# Patient Record
Sex: Male | Born: 1955 | Race: White | Hispanic: No | Marital: Married | Smoking: Former smoker
Health system: Southern US, Community
[De-identification: ages and names within clinical notes are randomized; demographics above are authoritative.]

## PROBLEM LIST (undated history)

## (undated) DIAGNOSIS — C801 Malignant (primary) neoplasm, unspecified: Secondary | ICD-10-CM

## (undated) DIAGNOSIS — J189 Pneumonia, unspecified organism: Secondary | ICD-10-CM

## (undated) DIAGNOSIS — M810 Age-related osteoporosis without current pathological fracture: Secondary | ICD-10-CM

## (undated) DIAGNOSIS — E119 Type 2 diabetes mellitus without complications: Secondary | ICD-10-CM

## (undated) DIAGNOSIS — J42 Unspecified chronic bronchitis: Secondary | ICD-10-CM

## (undated) DIAGNOSIS — J849 Interstitial pulmonary disease, unspecified: Secondary | ICD-10-CM

## (undated) DIAGNOSIS — R570 Cardiogenic shock: Secondary | ICD-10-CM

## (undated) DIAGNOSIS — Z79899 Other long term (current) drug therapy: Secondary | ICD-10-CM

## (undated) DIAGNOSIS — I2510 Atherosclerotic heart disease of native coronary artery without angina pectoris: Secondary | ICD-10-CM

## (undated) DIAGNOSIS — I779 Disorder of arteries and arterioles, unspecified: Secondary | ICD-10-CM

## (undated) DIAGNOSIS — I10 Essential (primary) hypertension: Secondary | ICD-10-CM

## (undated) DIAGNOSIS — C4401 Basal cell carcinoma of skin of lip: Secondary | ICD-10-CM

## (undated) DIAGNOSIS — A419 Sepsis, unspecified organism: Secondary | ICD-10-CM

## (undated) DIAGNOSIS — R918 Other nonspecific abnormal finding of lung field: Secondary | ICD-10-CM

## (undated) DIAGNOSIS — K219 Gastro-esophageal reflux disease without esophagitis: Secondary | ICD-10-CM

## (undated) DIAGNOSIS — Z7901 Long term (current) use of anticoagulants: Secondary | ICD-10-CM

## (undated) DIAGNOSIS — M069 Rheumatoid arthritis, unspecified: Secondary | ICD-10-CM

## (undated) DIAGNOSIS — J449 Chronic obstructive pulmonary disease, unspecified: Secondary | ICD-10-CM

## (undated) DIAGNOSIS — I480 Paroxysmal atrial fibrillation: Secondary | ICD-10-CM

## (undated) DIAGNOSIS — K760 Fatty (change of) liver, not elsewhere classified: Secondary | ICD-10-CM

## (undated) DIAGNOSIS — M0510 Rheumatoid lung disease with rheumatoid arthritis of unspecified site: Secondary | ICD-10-CM

## (undated) DIAGNOSIS — Z8719 Personal history of other diseases of the digestive system: Secondary | ICD-10-CM

## (undated) DIAGNOSIS — H9190 Unspecified hearing loss, unspecified ear: Secondary | ICD-10-CM

## (undated) DIAGNOSIS — I442 Atrioventricular block, complete: Secondary | ICD-10-CM

## (undated) DIAGNOSIS — L719 Rosacea, unspecified: Secondary | ICD-10-CM

## (undated) DIAGNOSIS — E1151 Type 2 diabetes mellitus with diabetic peripheral angiopathy without gangrene: Secondary | ICD-10-CM

## (undated) DIAGNOSIS — E785 Hyperlipidemia, unspecified: Secondary | ICD-10-CM

## (undated) DIAGNOSIS — I700 Atherosclerosis of aorta: Secondary | ICD-10-CM

## (undated) DIAGNOSIS — I6523 Occlusion and stenosis of bilateral carotid arteries: Secondary | ICD-10-CM

## (undated) DIAGNOSIS — I4891 Unspecified atrial fibrillation: Secondary | ICD-10-CM

## (undated) DIAGNOSIS — R748 Abnormal levels of other serum enzymes: Secondary | ICD-10-CM

## (undated) DIAGNOSIS — G4730 Sleep apnea, unspecified: Secondary | ICD-10-CM

## (undated) DIAGNOSIS — R0602 Shortness of breath: Secondary | ICD-10-CM

## (undated) DIAGNOSIS — Z7960 Long term (current) use of unspecified immunomodulators and immunosuppressants: Secondary | ICD-10-CM

## (undated) DIAGNOSIS — C44311 Basal cell carcinoma of skin of nose: Secondary | ICD-10-CM

## (undated) DIAGNOSIS — M1990 Unspecified osteoarthritis, unspecified site: Secondary | ICD-10-CM

## (undated) DIAGNOSIS — H409 Unspecified glaucoma: Secondary | ICD-10-CM

## (undated) HISTORY — PX: ESOPHAGOGASTRODUODENOSCOPY: SHX1529

## (undated) HISTORY — PX: OTHER SURGICAL HISTORY: SHX169

## (undated) HISTORY — PX: ABDOMINAL EXPLORATION SURGERY: SHX538

## (undated) HISTORY — PX: KNEE SURGERY: SHX244

## (undated) HISTORY — PX: LUNG SURGERY: SHX703

## (undated) HISTORY — DX: Other nonspecific abnormal finding of lung field: R91.8

## (undated) HISTORY — PX: TONSILLECTOMY: SUR1361

## (undated) HISTORY — PX: COLONOSCOPY: SHX174

## (undated) HISTORY — PX: HERNIA REPAIR: SHX51

## (undated) HISTORY — DX: Interstitial pulmonary disease, unspecified: J84.9

## (undated) HISTORY — DX: Unspecified atrial fibrillation: I48.91

---
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
## 1993-03-12 HISTORY — PX: KNEE SURGERY: SHX244

## 1998-03-12 HISTORY — PX: HIATAL HERNIA REPAIR: SHX195

## 2006-10-08 ENCOUNTER — Emergency Department: Payer: Self-pay | Admitting: Emergency Medicine

## 2006-10-08 ENCOUNTER — Other Ambulatory Visit: Payer: Self-pay

## 2012-03-12 ENCOUNTER — Emergency Department (HOSPITAL_COMMUNITY): Payer: Medicare Other

## 2012-03-12 ENCOUNTER — Encounter (HOSPITAL_COMMUNITY): Payer: Self-pay | Admitting: *Deleted

## 2012-03-12 ENCOUNTER — Emergency Department (HOSPITAL_COMMUNITY)
Admission: EM | Admit: 2012-03-12 | Discharge: 2012-03-13 | Disposition: A | Discharge status: Home / Self Care | Payer: Medicare Other | Attending: Emergency Medicine | Admitting: Emergency Medicine

## 2012-03-12 DIAGNOSIS — E119 Type 2 diabetes mellitus without complications: Secondary | ICD-10-CM | POA: Insufficient documentation

## 2012-03-12 DIAGNOSIS — R109 Unspecified abdominal pain: Secondary | ICD-10-CM

## 2012-03-12 DIAGNOSIS — IMO0002 Reserved for concepts with insufficient information to code with codable children: Secondary | ICD-10-CM | POA: Insufficient documentation

## 2012-03-12 DIAGNOSIS — Z8739 Personal history of other diseases of the musculoskeletal system and connective tissue: Secondary | ICD-10-CM | POA: Insufficient documentation

## 2012-03-12 DIAGNOSIS — Z9889 Other specified postprocedural states: Secondary | ICD-10-CM | POA: Insufficient documentation

## 2012-03-12 DIAGNOSIS — Z79899 Other long term (current) drug therapy: Secondary | ICD-10-CM | POA: Insufficient documentation

## 2012-03-12 HISTORY — DX: Unspecified osteoarthritis, unspecified site: M19.90

## 2012-03-12 HISTORY — DX: Type 2 diabetes mellitus without complications: E11.9

## 2012-03-12 LAB — CBC WITH DIFFERENTIAL/PLATELET
Eosinophils Absolute: 0.2 10*3/uL (ref 0.0–0.7)
Hemoglobin: 15.5 g/dL (ref 13.0–17.0)
Lymphs Abs: 2.8 10*3/uL (ref 0.7–4.0)
MCH: 29.8 pg (ref 26.0–34.0)
MCV: 87.3 fL (ref 78.0–100.0)
Monocytes Relative: 12 % (ref 3–12)
Neutrophils Relative %: 49 % (ref 43–77)
RBC: 5.21 MIL/uL (ref 4.22–5.81)

## 2012-03-12 LAB — COMPREHENSIVE METABOLIC PANEL
Alkaline Phosphatase: 73 U/L (ref 39–117)
BUN: 19 mg/dL (ref 6–23)
CO2: 27 mEq/L (ref 19–32)
GFR calc Af Amer: >90 mL/min (ref >90)
GFR calc non Af Amer: >90 mL/min (ref >90)
Glucose, Bld: 404 mg/dL — ABNORMAL HIGH (ref 70–99)
Potassium: 4 mEq/L (ref 3.5–5.1)
Total Bilirubin: 0.3 mg/dL (ref 0.3–1.2)
Total Protein: 7 g/dL (ref 6.0–8.3)

## 2012-03-12 LAB — URINALYSIS, ROUTINE W REFLEX MICROSCOPIC
Bilirubin Urine: NEGATIVE
Hgb urine dipstick: NEGATIVE
Nitrite: NEGATIVE
Specific Gravity, Urine: 1.031 — ABNORMAL HIGH (ref 1.005–1.030)
pH: 5 (ref 5.0–8.0)

## 2012-03-12 LAB — URINE MICROSCOPIC-ADD ON

## 2012-03-12 MED ORDER — ONDANSETRON HCL 4 MG/2ML IJ SOLN
4.0000 mg | Freq: Once | INTRAMUSCULAR | Status: AC
  Administered 2012-03-12: 4 mg via INTRAVENOUS
  Filled 2012-03-12: qty 2

## 2012-03-12 MED ORDER — HYDROMORPHONE HCL PF 1 MG/ML IJ SOLN
1.0000 mg | Freq: Once | INTRAMUSCULAR | Status: AC
  Administered 2012-03-12: 1 mg via INTRAVENOUS
  Filled 2012-03-12: qty 1

## 2012-03-12 NOTE — ED Notes (Signed)
Pt states right flank pain since 3-4 days ago, intermittant. Pt states movement sometimes stops the pain. Pain radiates from his back to his right side of abdomen. Pt states that he fell on his barn door on his right side 2-3 weeks ago and is unsure if that is the reason for the pain as well.

## 2012-03-13 ENCOUNTER — Emergency Department (HOSPITAL_COMMUNITY): Payer: Medicare Other

## 2012-03-13 MED ORDER — HYDROCODONE-ACETAMINOPHEN 5-325 MG PO TABS: 2.0000 | ORAL_TABLET | Freq: Four times a day (QID) | ORAL | Status: DC | PRN

## 2012-03-13 MED ORDER — SODIUM CHLORIDE 0.9 % IV BOLUS (SEPSIS)
1000.0000 mL | Freq: Once | INTRAVENOUS | Status: AC
  Administered 2012-03-13: 1000 mL via INTRAVENOUS

## 2012-03-13 NOTE — ED Provider Notes (Signed)
History     CSN: [id number]  Arrival date & time 03/12/12  2146   First MD Initiated Contact with Patient 03/12/12 2252      Chief Complaint  Patient presents with  . Flank Pain    right    (Consider location/radiation/quality/duration/timing/severity/associated sxs/prior treatment) HPI Hx per PT. R sided ABD pain for the last few weeks worse tonight, hurts to move, did fall over a montha go but was not hurting after that, has known elevated LFTs " all of the time" followed by PCP at [facility].  Pain not related. Pain is squeezing in quality and radiates from R thoracic spine to R flank and RUQ.yesterday felt numb. No weakness. No extremity numbness. No rash, no h/osame. Mod in severity Past Medical History  Diagnosis Date  . Diabetes mellitus without complication   . Arthritis     Past Surgical History  Procedure Date  . Knee surgery   . Hernia repair     umbilical    History reviewed. No pertinent family history.  History  Substance Use Topics  . Smoking status: Never Smoker   . Smokeless tobacco: Not on file  . Alcohol Use: No      Review of Systems  Constitutional: Negative for fever and chills.  HENT: Negative for neck pain and neck stiffness.   Eyes: Negative for pain.  Respiratory: Negative for shortness of breath.   Cardiovascular: Negative for chest pain.  Gastrointestinal: Negative for nausea, vomiting, diarrhea, constipation, blood in stool and abdominal distention.  Genitourinary: Negative for dysuria, urgency and frequency.  Musculoskeletal: Negative for back pain.  Skin: Negative for rash.  Neurological: Negative for headaches.  All other systems reviewed and are negative.    Allergies  Methotrexate derivatives and Aspirin  Home Medications   Current Outpatient Rx  Name  Route  Sig  Dispense  Refill  . ADALIMUMAB 40 MG/0.8ML Victoria KIT   Subcutaneous   Inject 40 mg into the skin every 14 (fourteen) days.         Marland Kitchen VITAMIN D 2000 UNITS PO  CAPS   Oral   Take 2,000 Units by mouth daily.         Marland Kitchen GLIMEPIRIDE 4 MG PO TABS   Oral   Take 4 mg by mouth daily before breakfast.         . IPRATROPIUM-ALBUTEROL 20-100 MCG/ACT IN AERS   Inhalation   Inhale 1 puff into the lungs 4 (four) times daily as needed. For copd         . LEFLUNOMIDE 20 MG PO TABS   Oral   Take 20 mg by mouth daily.         . ADULT MULTIVITAMIN W/MINERALS CH   Oral   Take 1 tablet by mouth daily.         Marland Kitchen PREDNISONE 10 MG PO TABS   Oral   Take 10 mg by mouth daily.         Marland Kitchen ROSUVASTATIN CALCIUM 20 MG PO TABS   Oral   Take 20 mg by mouth daily.         Marland Kitchen VITAMIN E 400 UNITS PO CAPS   Oral   Take 400 Units by mouth daily.           BP 147/80  Pulse 84  Temp 97.4 F (36.3 C) (Oral)  Resp 16  SpO2 96%  Physical Exam  Constitutional: He is oriented to person, place, and time. He appears well-developed and  well-nourished.  HENT:  Head: Normocephalic and atraumatic.  Eyes: Conjunctivae normal and EOM are normal. Pupils are equal, round, and reactive to light. No scleral icterus.  Neck: Trachea normal. Neck supple. No thyromegaly present.  Cardiovascular: Normal rate, regular rhythm, S1 normal, S2 normal and normal pulses.     No systolic murmur is present   No diastolic murmur is present  Pulses:      Radial pulses are 2+ on the right side, and 2+ on the left side.  Pulmonary/Chest: Effort normal and breath sounds normal. He has no wheezes. He has no rhonchi. He has no rales. He exhibits no tenderness.  Abdominal: Soft. Normal appearance and bowel sounds are normal. There is no tenderness. There is no CVA tenderness and negative Murphy's sign.       Localizes discomfort R flank without ecchymosis or rash and no reproducible tenderness, neg Murphys sign  Musculoskeletal:       BLE:s Calves nontender, no cords or erythema, negative Homans sign  Neurological: He is alert and oriented to person, place, and time. He has  normal strength. No cranial nerve deficit or sensory deficit. GCS eye subscore is 4. GCS verbal subscore is 5. GCS motor subscore is 6.  Skin: Skin is warm and dry. No rash noted. He is not diaphoretic.  Psychiatric: His speech is normal.       Cooperative and appropriate    ED Course  Procedures (including critical care time)  Results for orders placed during the hospital encounter of 03/12/12  URINALYSIS, ROUTINE W REFLEX MICROSCOPIC      Component Value Range   Color, Urine YELLOW  YELLOW   APPearance CLEAR  CLEAR   Specific Gravity, Urine 1.031 (*) 1.005 - 1.030   pH 5.0  5.0 - 8.0   Glucose, UA >1000 (*) NEGATIVE mg/dL   Hgb urine dipstick NEGATIVE  NEGATIVE   Bilirubin Urine NEGATIVE  NEGATIVE   Ketones, ur NEGATIVE  NEGATIVE mg/dL   Protein, ur NEGATIVE  NEGATIVE mg/dL   Urobilinogen, UA 0.2  0.0 - 1.0 mg/dL   Nitrite NEGATIVE  NEGATIVE   Leukocytes, UA NEGATIVE  NEGATIVE  CBC WITH DIFFERENTIAL      Component Value Range   WBC 7.9  4.0 - 10.5 K/uL   RBC 5.21  4.22 - 5.81 MIL/uL   Hemoglobin 15.5  13.0 - 17.0 g/dL   HCT 40.9  81.1 - 91.4 %   MCV 87.3  78.0 - 100.0 fL   MCH 29.8  26.0 - 34.0 pg   MCHC 34.1  30.0 - 36.0 g/dL   RDW 78.2  95.6 - 21.3 %   Platelets 187  150 - 400 K/uL   Neutrophils Relative 49  43 - 77 %   Neutro Abs 3.9  1.7 - 7.7 K/uL   Lymphocytes Relative 36  12 - 46 %   Lymphs Abs 2.8  0.7 - 4.0 K/uL   Monocytes Relative 12  3 - 12 %   Monocytes Absolute 0.9  0.1 - 1.0 K/uL   Eosinophils Relative 3  0 - 5 %   Eosinophils Absolute 0.2  0.0 - 0.7 K/uL   Basophils Relative 0  0 - 1 %   Basophils Absolute 0.0  0.0 - 0.1 K/uL  COMPREHENSIVE METABOLIC PANEL      Component Value Range   Sodium 133 (*) 135 - 145 mEq/L   Potassium 4.0  3.5 - 5.1 mEq/L   Chloride 96  96 - 112  mEq/L   CO2 27  19 - 32 mEq/L   Glucose, Bld 404 (*) 70 - 99 mg/dL   BUN 19  6 - 23 mg/dL   Creatinine, Ser 6.96  0.50 - 1.35 mg/dL   Calcium 9.1  8.4 - 29.5 mg/dL   Total  Protein 7.0  6.0 - 8.3 g/dL   Albumin 3.5  3.5 - 5.2 g/dL   AST 42 (*) 0 - 37 U/L   ALT 75 (*) 0 - 53 U/L   Alkaline Phosphatase 73  39 - 117 U/L   Total Bilirubin 0.3  0.3 - 1.2 mg/dL   GFR calc non Af Amer >90  >90 mL/min   GFR calc Af Amer >90  >90 mL/min  URINE MICROSCOPIC-ADD ON      Component Value Range   WBC, UA 0-2  <3 WBC/hpf   RBC / HPF 0-2  <3 RBC/hpf   Ct Abdomen Pelvis Wo Contrast  03/13/2012  *RADIOLOGY REPORT*  Clinical Data: Right flank and right lower quadrant pain.  CT ABDOMEN AND PELVIS WITHOUT CONTRAST  Technique:  Multidetector CT imaging of the abdomen and pelvis was performed following the standard protocol without intravenous contrast.  Comparison: None.  Findings: The visualized lung bases are clear.  The liver and spleen are unremarkable in appearance.  The gallbladder is within normal limits.  The pancreas and adrenal glands are unremarkable.  Mild nonspecific perinephric stranding is noted bilaterally.  The kidneys are otherwise unremarkable in appearance.  There is no evidence of hydronephrosis.  No renal or ureteral stones are seen.  No free fluid is identified.  The small bowel is unremarkable in appearance.  The stomach is within normal limits.  No acute vascular abnormalities are seen.  Mild scattered calcification is noted along the distal abdominal aorta and its branches. Incidental note is made of a circumaortic left renal vein.  The appendix is normal in caliber and contains air, without evidence for appendicitis.  The colon is unremarkable in appearance.  The bladder is largely decompressed and grossly unremarkable; a small urachal remnant is incidentally noted.  The prostate remains normal in size.  No inguinal lymphadenopathy is seen.  No acute osseous abnormalities are identified.  IMPRESSION:  1.  No acute abnormality seen within the abdomen or pelvis. 2.  Mild scattered calcification along the distal abdominal aorta and its branches. 3.  Incidental note of a  circumaortic left renal vein.   Original Report Authenticated By: Tonia Ghent, M.D.    US Abdomen Complete  03/13/2012  *RADIOLOGY REPORT*  Clinical Data:  Right upper quadrant and right flank pain.  ABDOMINAL ULTRASOUND COMPLETE  Comparison:  CT of the abdomen and pelvis performed 03/12/2012  Findings:  Gallbladder:  The gallbladder is normal in appearance, without evidence for gallstones, gallbladder wall thickening or pericholecystic fluid.  No ultrasonographic Murphy's sign is elicited.  Common Bile Duct:  0.3 cm in diameter; within normal limits in caliber.  Liver:  Areas of mildly increased parenchymal echogenicity within the liver may reflect mild fatty infiltration; no focal lesions identified.  Limited Doppler evaluation demonstrates normal blood flow within the liver.  IVC:  Unremarkable in appearance.  Pancreas:  Although the pancreas is difficult to visualize in its entirety due to overlying bowel gas, no focal pancreatic abnormality is identified.  Spleen:  10.0 cm in length; within normal limits in size and echotexture.  Right kidney:  11.1 cm in length; normal in size, configuration and parenchymal echogenicity.  No evidence  of hydronephrosis.  A 1.9 x 1.3 x 1.4 cm cyst is noted at the lower pole of the right kidney.  Left kidney:  11.6 cm in length; normal in size, configuration and parenchymal echogenicity.  No evidence of mass or hydronephrosis.  Abdominal Aorta:  Normal in caliber; no aneurysm identified.  IMPRESSION:  1.  No acute abnormality seen within abdomen. 2.  Question of mild fatty infiltration within the liver. 3.  Small right renal cyst seen.  On further evaluation of the recent prior CT, there appears to be minimal soft tissue inflammation involving the central small bowel mesentery; this may reflect a mild infectious or inflammatory process.  No additional evidence for acute abnormality within the abdomen or pelvis.   Original Report Authenticated By: Tonia Ghent, M.D.     IVFs. IV Dilaudid  12:42 AM recheck pain much improved.   Plan Rx and f/u PCP. No CP/ SOB to suggest PE and no deficits to suggest need for emergent MRI.   PT and his wife are comfortable with plan and do have scheduled PCP follow up 2 days MDM   Right back and flank pain radiates right upper quadrant abdomen. Evaluated with labs and imaging as above. Treated with IV fluids and IV narcotics. 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 Condition improved. Vital signs and nursing notes reviewed and considered.        Sunnie Nielsen, MD 03/13/12 [phone]

## 2012-03-13 NOTE — ED Notes (Signed)
Pt Returned from Ultrasound 

## 2012-03-13 NOTE — ED Notes (Signed)
Dr. Opitz at bedside. 

## 2012-08-14 ENCOUNTER — Other Ambulatory Visit: Payer: Self-pay | Admitting: Gastroenterology

## 2012-11-04 ENCOUNTER — Encounter (HOSPITAL_COMMUNITY): Admission: RE | Payer: Self-pay | Source: Ambulatory Visit

## 2012-11-04 ENCOUNTER — Ambulatory Visit (HOSPITAL_COMMUNITY)
Admission: RE | Admit: 2012-11-04 | Payer: PRIVATE HEALTH INSURANCE | Source: Ambulatory Visit | Admitting: Gastroenterology

## 2012-11-04 SURGERY — COLONOSCOPY WITH PROPOFOL
Anesthesia: Monitor Anesthesia Care

## 2012-11-17 ENCOUNTER — Other Ambulatory Visit: Payer: Self-pay | Admitting: Otolaryngology

## 2012-11-17 DIAGNOSIS — R220 Localized swelling, mass and lump, head: Secondary | ICD-10-CM

## 2012-11-20 ENCOUNTER — Other Ambulatory Visit: Payer: PRIVATE HEALTH INSURANCE

## 2012-11-20 ENCOUNTER — Ambulatory Visit
Admission: RE | Admit: 2012-11-20 | Discharge: 2012-11-20 | Disposition: A | Discharge status: Home / Self Care | Payer: Medicare Other | Source: Ambulatory Visit | Attending: Otolaryngology | Admitting: Otolaryngology

## 2012-11-20 DIAGNOSIS — R220 Localized swelling, mass and lump, head: Secondary | ICD-10-CM

## 2012-11-20 MED ORDER — IOHEXOL 300 MG/ML  SOLN
76.0000 mL | Freq: Once | INTRAMUSCULAR | Status: AC | PRN
  Administered 2012-11-20: 76 mL via INTRAVENOUS

## 2012-11-26 ENCOUNTER — Other Ambulatory Visit: Payer: Self-pay | Admitting: Otolaryngology

## 2012-11-26 DIAGNOSIS — R221 Localized swelling, mass and lump, neck: Secondary | ICD-10-CM

## 2012-11-30 ENCOUNTER — Ambulatory Visit
Admission: RE | Admit: 2012-11-30 | Discharge: 2012-11-30 | Disposition: A | Discharge status: Home / Self Care | Payer: Medicare Other | Source: Ambulatory Visit | Attending: Otolaryngology | Admitting: Otolaryngology

## 2012-11-30 DIAGNOSIS — R221 Localized swelling, mass and lump, neck: Secondary | ICD-10-CM

## 2012-11-30 MED ORDER — GADOBENATE DIMEGLUMINE 529 MG/ML IV SOLN
17.0000 mL | Freq: Once | INTRAVENOUS | Status: AC | PRN
  Administered 2012-11-30: 17 mL via INTRAVENOUS

## 2012-12-01 ENCOUNTER — Emergency Department: Payer: Self-pay | Admitting: Emergency Medicine

## 2012-12-24 ENCOUNTER — Other Ambulatory Visit: Payer: Self-pay | Admitting: Otolaryngology

## 2012-12-24 DIAGNOSIS — R1310 Dysphagia, unspecified: Secondary | ICD-10-CM

## 2012-12-29 ENCOUNTER — Ambulatory Visit
Admission: RE | Admit: 2012-12-29 | Discharge: 2012-12-29 | Disposition: A | Discharge status: Home / Self Care | Payer: Medicare Other | Source: Ambulatory Visit | Attending: Otolaryngology | Admitting: Otolaryngology

## 2012-12-29 DIAGNOSIS — R1310 Dysphagia, unspecified: Secondary | ICD-10-CM

## 2013-01-05 ENCOUNTER — Encounter (HOSPITAL_COMMUNITY): Payer: Self-pay | Admitting: Pharmacy Technician

## 2013-01-05 NOTE — H&P (Signed)
Assessment  Former cigarette smoker of unknown amount (Z61.09) (U04.540). Neck mass (784.2) (R22.1). Orders  CT Neck with contrast; Requested for: 14 Nov 2012. Discussed  Right submandibular mass, or possibly level 2 nodes. It's a little difficult to distinguish based on examination. Recommend CT evaluation. No other pathology identified. Amended : Serena Colonel  M.D.; 11/21/2012 12:41 PM EST. Reason For Visit  Darryl Jones is here today at the kind request of [place], Hal for consultation and opinion for lump under chain. HPI  One month history of intermittent swelling of the right upper neck area. He is currently on antibiotics. He has associated difficulty swallowing when this swells up. He quit smoking a couple years ago as well as drinking. He is on Humira for rheumatoid arthritis. Allergies  Aspirin TABS; Rash Methotrexate Derivatives. Current Meds  Centrum Oral Tablet;TAKE 1 TABLET DAILY.; RPT Augmentin 875-125 MG Oral Tablet (Amoxicillin-Pot Clavulanate);; RPT Combivent 18-103 MCG/ACT AERO;INHALE 2 PUFFS EVERY 4-6 HOURS AS NEEDED.; RPT Vitamin E CAPS;; RPT Arava TABS (Leflunomide);; RPT Lantus SOLN;; RPT MetFORMIN HCl TABS;; RPT Humira 40 MG/0.8ML Subcutaneous Kit;; RPT PredniSONE TABS;; RPT Crestor 10 MG Oral Tablet;TAKE 1 TABLET DAILY.; RPT Glimepiride TABS;; RPT Tylenol Ex St Arthritis Pain 500 MG TABS;; RPT. 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   (733.09,E980.5) (M81.8) Hearing loss   (389.9) (H91.90) Hypercholesteremia   (272.0) (E78.0) Hypogonadism male   (257.2) (E29.1) Joint pain of ankle and foot   (719.47) (M25.579) Lumbago   (724.2) (M54.5) Malignant neoplasm of skin   (173.90) (C44.90) Nonspecific elevation of level of transaminase or lactic acid  dehydrogenase (LDH)   (790.4) (R74.0); 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 PMH  Acute gastric ulcer (531.30) (K25.3) CT Lung Pulmonary Nodule Multiple; described as tiny and present on 08/2002 scan and stable on 01/2003 scan. PSH  Hernia Repair Knee Surgery Liver Biopsy Oral Surgery Tooth Extraction Surgery Testis Tonsillectomy Treatment Of Knee Fracture Patellar, Open Umbilical Hernia Repair. Family Hx  Family history of cardiac disorder: Sister (V17.49) (Z82.49) Family history of diabetes mellitus: Sibling (V18.0) (Z83.3) Family history of emphysema: Father (V17.6) (Z82.5) Family history of osteoporosis (V17.81) (Z82.62) Family history of rheumatoid arthritis (V17.7) (Z82.61). Personal Hx  Caffeine use (305.90) (F15.929); 5 cups daily Current every day smoker (305.1) (F17.200) Former smoker (V15.82) [phone]) No alcohol use. ROS  Systemic: Feeling tired (fatigue).  No fever.  Night sweats  and recent weight loss. Head: No headache. Eyes: No eye symptoms. Otolaryngeal: Hearing loss.  No earache, no tinnitus, and no purulent nasal discharge.  No nasal passage blockage (stuffiness).  Snoring  and sneezing.  No hoarseness  and no sore throat. Cardiovascular: No chest pain or discomfort  and no palpitations. Pulmonary: No dyspnea.  Cough  and wheezing. Gastrointestinal: Dysphagia.  No heartburn.  No nausea, no abdominal pain, and no melena.  No diarrhea. Genitourinary: No dysuria. Endocrine: No muscle weakness. Musculoskeletal: No calf muscle cramps.  Arthralgias.  No soft tissue swelling. Neurological: No dizziness  and no fainting.  Tingling.  No numbness. Psychological: No  anxiety  and no  depression. Skin: No rash. 12 system ROS was obtained and reviewed on the Health Maintenance form dated today.  Positive responses are shown above.  If the symptom is not checked, the patient has denied it. Vital Signs   Recorded by Skolimowski,Sharon on 14 Nov 2012 03:29 PM BP:124/80,  Height: 66.25 in, Weight: 191.4 lb, BMI: 30.7 kg/m2,  BMI Calculated: 30.66 ,  BSA Calculated: 1.97. Physical Exam  APPEARANCE: Well developed, well nourished, in no acute distress.  Normal affect, in a pleasant mood.  Oriented to time, place and person. COMMUNICATION: Normal voice   HEAD & FACE:  No scars, lesions or masses of head and face.  Sinuses nontender to palpation.  Salivary glands without mass or tenderness.  Facial strength symmetric.  No facial lesion, scars, or mass. EYES: EOMI with normal primary gaze alignment. Visual acuity grossly intact.  PERRLA EXTERNAL EAR & NOSE: No scars, lesions or masses  EAC & TYMPANIC MEMBRANE:  EAC shows no obstructing lesions or debris and tympanic membranes are normal bilaterally with good movement to insufflation. GROSS HEARING: Normal   TMJ:  Nontender  INTRANASAL EXAM: No polyps or purulence.  NASOPHARYNX: Normal, without lesions. LIPS, TEETH & GUMS: No lip lesions, upper denture, one remaining teeth on the mandible, and normal gums. ORAL CAVITY/OROPHARYNX:  Oral mucosa moist without lesion or asymmetry of the palate, tongue, tonsil or posterior pharynx. NECK:  Supple without adenopathy or mass, except for a firm 3-4 cm right submandibular mass which I'm not sure if his part of the gland were not. THYROID:  Normal with no masses palpable.  NEUROLOGIC:  No gross CN deficits. No nystagmus noted.   LYMPHATIC:  No other enlarged nodes palpable. Procedure  Fiberoptic Laryngoscopy Name: Darryl Jones     Age: 57 year     The risks and benefits of this procedure have been thoroughly discussed with the patient.  The most commons risks outlined  included but were not limited to: injury  to the nasal mucosa or throat irritation.  The patient was further informed that there are other less common risks.  The patient was given the opportunity to ask questions and all such questions were answered to the patient's satisfaction.  Patient acknowledged the risks and has agreed to proceed.   Performing Provider: Serena Colonel The risks of the procedure are minimal and were discussed with the patient today. Pre-op Diagnosis: dysphagia  Post-op Diagnosis:Same Allergy:  reviewed allergies as listed Nasal Prep:Lidocaine/Afrin   Procedure:     With the patient seated in the exam chair, the R nasal cavity was intubated with the flexible laryngoscope.  The nasal cavity mucosa, nasopharynx, hypopharynx and larynx were all examined with findings as noted.  The scope was then removed.  The patient tolerated the procedure well without complication or blood loss (unless indicated in findings).   FINDINGS: Nasal mucosa: NPR Nasopharynx: NPR Hypopharynx: NPR Larynx: NPR . Signature  Electronically signed by : Serena Colonel  M.D.; 11/14/2012 3:47 PM EST. Electronically signed by : Serena Colonel  M.D.; 11/21/2012 12:41 PM EST.

## 2013-01-06 ENCOUNTER — Encounter (HOSPITAL_COMMUNITY)
Admission: RE | Admit: 2013-01-06 | Discharge: 2013-01-06 | Disposition: A | Discharge status: Home / Self Care | Payer: Medicare Other | Source: Ambulatory Visit | Attending: Otolaryngology | Admitting: Otolaryngology

## 2013-01-06 ENCOUNTER — Encounter (HOSPITAL_COMMUNITY): Payer: Self-pay

## 2013-01-06 ENCOUNTER — Ambulatory Visit (HOSPITAL_COMMUNITY)
Admission: RE | Admit: 2013-01-06 | Discharge: 2013-01-06 | Disposition: A | Discharge status: Home / Self Care | Payer: Medicare Other | Source: Ambulatory Visit | Attending: Anesthesiology | Admitting: Anesthesiology

## 2013-01-06 DIAGNOSIS — Z01810 Encounter for preprocedural cardiovascular examination: Secondary | ICD-10-CM | POA: Insufficient documentation

## 2013-01-06 DIAGNOSIS — Z01812 Encounter for preprocedural laboratory examination: Secondary | ICD-10-CM | POA: Insufficient documentation

## 2013-01-06 DIAGNOSIS — Z01818 Encounter for other preprocedural examination: Secondary | ICD-10-CM | POA: Insufficient documentation

## 2013-01-06 DIAGNOSIS — J438 Other emphysema: Secondary | ICD-10-CM | POA: Insufficient documentation

## 2013-01-06 HISTORY — DX: Age-related osteoporosis without current pathological fracture: M81.0

## 2013-01-06 HISTORY — DX: Hyperlipidemia, unspecified: E78.5

## 2013-01-06 HISTORY — DX: Chronic obstructive pulmonary disease, unspecified: J44.9

## 2013-01-06 HISTORY — DX: Rosacea, unspecified: L71.9

## 2013-01-06 HISTORY — DX: Abnormal levels of other serum enzymes: R74.8

## 2013-01-06 HISTORY — DX: Unspecified hearing loss, unspecified ear: H91.90

## 2013-01-06 HISTORY — DX: Malignant (primary) neoplasm, unspecified: C80.1

## 2013-01-06 LAB — COMPREHENSIVE METABOLIC PANEL
AST: 70 U/L — ABNORMAL HIGH (ref 0–37)
BUN: 8 mg/dL (ref 6–23)
CO2: 28 mEq/L (ref 19–32)
Calcium: 9.3 mg/dL (ref 8.4–10.5)
Creatinine, Ser: 0.86 mg/dL (ref 0.50–1.35)
GFR calc Af Amer: >90 mL/min (ref >90)
GFR calc non Af Amer: >90 mL/min (ref >90)
Sodium: 141 mEq/L (ref 135–145)
Total Bilirubin: 0.4 mg/dL (ref 0.3–1.2)
Total Protein: 6.9 g/dL (ref 6.0–8.3)

## 2013-01-06 LAB — CBC
HCT: 44.8 % (ref 39.0–52.0)
Hemoglobin: 15.5 g/dL (ref 13.0–17.0)
MCH: 29.5 pg (ref 26.0–34.0)
Platelets: 207 10*3/uL (ref 150–400)
RBC: 5.26 MIL/uL (ref 4.22–5.81)

## 2013-01-06 MED ORDER — CEFAZOLIN SODIUM-DEXTROSE 2-3 GM-% IV SOLR
2.0000 g | INTRAVENOUS | Status: AC
  Administered 2013-01-07: 2 g via INTRAVENOUS
  Filled 2013-01-06: qty 50

## 2013-01-06 NOTE — Progress Notes (Signed)
Pt made aware that surgery is scheduled for tomorrow, Wednesday, January 07, 2013. Pt told to arrive at 6:30 AM.

## 2013-01-06 NOTE — Progress Notes (Signed)
Have shown EKG to Dr. Noreene Larsson...we will evaluate patient DOS....DA

## 2013-01-06 NOTE — Pre-Procedure Instructions (Signed)
Darryl Jones  01/06/2013   Your procedure is scheduled on:  Friday, January 09, 2013  Report to [facility] Short Stay (use Main Entrance "A'') at 6:30 AM.  Call this number if you have problems the morning of surgery: [phone]   Remember:   Do not eat food or drink liquids after midnight.   Take these medicines the morning of surgery with A SIP OF WATER: leflunomide (ARAVA) 20 MG  Tablet, predniSONE (DELTASONE) 5 MG tablet, if needed:acetaminophen (TYLENOL) 500 MG tablet  For pain, Ipratropium-Albuterol (COMBIVENT RESPIMAT) 20-100 MCG/ACT AERS respimat for COPD  (Bring inhaler with you on day of surgery)  Stop taking Aspirin, vitamins, and herbal medications (vitamin E 400 UNIT capsule)    Do not take any NSAIDs ie: Ibuprofen, Advil, Naproxen or any medication containing Aspirin.   Do not wear jewelry, make-up or nail polish.  Do not wear lotions, powders, or perfumes. You may wear deodorant.  Do not shave 48 hours prior to surgery. Men may shave face and neck.  Do not bring valuables to the hospital.  [facility] is not responsible  for any belongings or valuables.               Contacts, dentures or bridgework may not be worn into surgery.  Leave suitcase in the car. After surgery it may be brought to your room.  For patients admitted to the hospital, discharge time is determined by your treatment team.               Patients discharged the day of surgery will not be allowed to drive home.  Name and phone number of your driver:  Special Instructions: Shower using CHG 2 nights before surgery and the night before surgery.  If you shower the day of surgery use CHG.  Use special wash - you have one bottle of CHG for all showers.  You should use approximately 1/3 of the bottle for each shower.   Please read over the following fact sheets that you were given: Pain Booklet, Coughing and Deep Breathing and Surgical Site Infection Prevention

## 2013-01-06 NOTE — Progress Notes (Signed)
Pt denies chest pain and being under the care of a cardiologist but states that he gets SOB with exertion. Pt denies having a chest x ray and EKG within the last 12 months but states that he had a stress test 6-7 years ago. Previous EKG's, and stress test results were requested from [facility]. Pt chart was left for [place], [place]  (anesthesia) to review abnormal EKG.

## 2013-01-07 ENCOUNTER — Encounter (HOSPITAL_COMMUNITY): Payer: Self-pay | Admitting: Surgery

## 2013-01-07 ENCOUNTER — Encounter (HOSPITAL_COMMUNITY)
Admission: RE | Disposition: A | Discharge status: Home / Self Care | Payer: Self-pay | Source: Ambulatory Visit | Attending: Otolaryngology

## 2013-01-07 ENCOUNTER — Ambulatory Visit (HOSPITAL_COMMUNITY): Payer: Medicare Other | Admitting: Anesthesiology

## 2013-01-07 ENCOUNTER — Encounter (HOSPITAL_COMMUNITY): Payer: Medicare Other | Admitting: Vascular Surgery

## 2013-01-07 ENCOUNTER — Observation Stay (HOSPITAL_COMMUNITY)
Admission: RE | Admit: 2013-01-07 | Discharge: 2013-01-09 | Disposition: A | Discharge status: Home / Self Care | Payer: Medicare Other | Source: Ambulatory Visit | Attending: Otolaryngology | Admitting: Otolaryngology

## 2013-01-07 DIAGNOSIS — Z79899 Other long term (current) drug therapy: Secondary | ICD-10-CM | POA: Insufficient documentation

## 2013-01-07 DIAGNOSIS — E119 Type 2 diabetes mellitus without complications: Secondary | ICD-10-CM | POA: Insufficient documentation

## 2013-01-07 DIAGNOSIS — J4489 Other specified chronic obstructive pulmonary disease: Secondary | ICD-10-CM | POA: Insufficient documentation

## 2013-01-07 DIAGNOSIS — E7800 Pure hypercholesterolemia, unspecified: Secondary | ICD-10-CM | POA: Insufficient documentation

## 2013-01-07 DIAGNOSIS — Z794 Long term (current) use of insulin: Secondary | ICD-10-CM | POA: Insufficient documentation

## 2013-01-07 DIAGNOSIS — K1120 Sialoadenitis, unspecified: Principal | ICD-10-CM | POA: Insufficient documentation

## 2013-01-07 DIAGNOSIS — J449 Chronic obstructive pulmonary disease, unspecified: Secondary | ICD-10-CM | POA: Insufficient documentation

## 2013-01-07 DIAGNOSIS — R220 Localized swelling, mass and lump, head: Secondary | ICD-10-CM | POA: Insufficient documentation

## 2013-01-07 HISTORY — DX: Shortness of breath: R06.02

## 2013-01-07 HISTORY — PX: SUBMANDIBULAR GLAND EXCISION: SHX2456

## 2013-01-07 LAB — GLUCOSE, CAPILLARY
Glucose-Capillary: 125 mg/dL — ABNORMAL HIGH (ref 70–99)
Glucose-Capillary: 183 mg/dL — ABNORMAL HIGH (ref 70–99)
Glucose-Capillary: 180 mg/dL — ABNORMAL HIGH (ref 70–99)
Glucose-Capillary: 224 mg/dL — ABNORMAL HIGH (ref 70–99)

## 2013-01-07 SURGERY — EXCISION, SUBMANDIBULAR GLAND
Anesthesia: General | Site: Neck | Laterality: Right | Wound class: Clean

## 2013-01-07 MED ORDER — OXYCODONE HCL 5 MG/5ML PO SOLN: 5.0000 mg | Freq: Once | ORAL | Status: DC | PRN

## 2013-01-07 MED ORDER — ROCURONIUM BROMIDE 100 MG/10ML IV SOLN
INTRAVENOUS | Status: DC | PRN
  Administered 2013-01-07: 50 mg via INTRAVENOUS

## 2013-01-07 MED ORDER — HYDROMORPHONE HCL PF 1 MG/ML IJ SOLN
0.2500 mg | INTRAMUSCULAR | Status: DC | PRN
Start: 2013-01-07 — End: 2013-01-07

## 2013-01-07 MED ORDER — ONDANSETRON HCL 4 MG/2ML IJ SOLN
INTRAMUSCULAR | Status: DC | PRN
  Administered 2013-01-07: 4 mg via INTRAVENOUS

## 2013-01-07 MED ORDER — GLIMEPIRIDE 4 MG PO TABS
4.0000 mg | ORAL_TABLET | Freq: Every day | ORAL | Status: DC
Start: 2013-01-08 — End: 2013-01-09
  Administered 2013-01-08 – 2013-01-09 (×2): 4 mg via ORAL
  Filled 2013-01-07 (×3): qty 1

## 2013-01-07 MED ORDER — PHENYLEPHRINE HCL 10 MG/ML IJ SOLN
INTRAMUSCULAR | Status: DC | PRN
  Administered 2013-01-07 (×2): 40 ug via INTRAVENOUS
  Administered 2013-01-07 (×2): 80 ug via INTRAVENOUS

## 2013-01-07 MED ORDER — GLYCOPYRROLATE 0.2 MG/ML IJ SOLN
INTRAMUSCULAR | Status: DC | PRN
  Administered 2013-01-07: 0.6 mg via INTRAVENOUS

## 2013-01-07 MED ORDER — PROMETHAZINE HCL 25 MG RE SUPP: 25.0000 mg | Freq: Four times a day (QID) | RECTAL | Status: DC | PRN

## 2013-01-07 MED ORDER — HYDROCODONE-ACETAMINOPHEN 7.5-325 MG PO TABS: 1.0000 | ORAL_TABLET | Freq: Four times a day (QID) | ORAL | Status: DC | PRN

## 2013-01-07 MED ORDER — DEXAMETHASONE SODIUM PHOSPHATE 4 MG/ML IJ SOLN
INTRAMUSCULAR | Status: DC | PRN
  Administered 2013-01-07: 8 mg via INTRAVENOUS

## 2013-01-07 MED ORDER — ATORVASTATIN CALCIUM 20 MG PO TABS
20.0000 mg | ORAL_TABLET | Freq: Every day | ORAL | Status: DC
  Administered 2013-01-07 – 2013-01-08 (×2): 20 mg via ORAL
  Filled 2013-01-07 (×3): qty 1

## 2013-01-07 MED ORDER — 0.9 % SODIUM CHLORIDE (POUR BTL) OPTIME
TOPICAL | Status: DC | PRN
  Administered 2013-01-07: 1000 mL

## 2013-01-07 MED ORDER — VITAMIN E 180 MG (400 UNIT) PO CAPS
400.0000 [IU] | ORAL_CAPSULE | Freq: Every day | ORAL | Status: DC
  Administered 2013-01-07 – 2013-01-09 (×3): 400 [IU] via ORAL
  Filled 2013-01-07 (×3): qty 1

## 2013-01-07 MED ORDER — IPRATROPIUM-ALBUTEROL 20-100 MCG/ACT IN AERS
1.0000 | INHALATION_SPRAY | Freq: Four times a day (QID) | RESPIRATORY_TRACT | Status: DC | PRN
  Filled 2013-01-07: qty 4

## 2013-01-07 MED ORDER — METFORMIN HCL 500 MG PO TABS
500.0000 mg | ORAL_TABLET | Freq: Two times a day (BID) | ORAL | Status: DC
  Administered 2013-01-07 – 2013-01-09 (×4): 500 mg via ORAL
  Filled 2013-01-07 (×6): qty 1

## 2013-01-07 MED ORDER — CEPHALEXIN 500 MG PO CAPS: 500.0000 mg | ORAL_CAPSULE | Freq: Three times a day (TID) | ORAL | Status: DC

## 2013-01-07 MED ORDER — PREDNISONE 5 MG PO TABS
7.5000 mg | ORAL_TABLET | Freq: Every day | ORAL | Status: DC
  Administered 2013-01-08 – 2013-01-09 (×2): 7.5 mg via ORAL
  Filled 2013-01-07 (×2): qty 1

## 2013-01-07 MED ORDER — NEOSTIGMINE METHYLSULFATE 1 MG/ML IJ SOLN
INTRAMUSCULAR | Status: DC | PRN
  Administered 2013-01-07: 4 mg via INTRAVENOUS

## 2013-01-07 MED ORDER — MIDAZOLAM HCL 5 MG/5ML IJ SOLN
INTRAMUSCULAR | Status: DC | PRN
  Administered 2013-01-07: 2 mg via INTRAVENOUS

## 2013-01-07 MED ORDER — ADULT MULTIVITAMIN W/MINERALS CH
1.0000 | ORAL_TABLET | Freq: Every day | ORAL | Status: DC
  Administered 2013-01-07 – 2013-01-09 (×3): 1 via ORAL
  Filled 2013-01-07 (×3): qty 1

## 2013-01-07 MED ORDER — PROMETHAZINE HCL 25 MG PO TABS: 25.0000 mg | ORAL_TABLET | Freq: Four times a day (QID) | ORAL | Status: DC | PRN

## 2013-01-07 MED ORDER — FENTANYL CITRATE 0.05 MG/ML IJ SOLN: 50.0000 ug | Freq: Once | INTRAMUSCULAR | Status: DC

## 2013-01-07 MED ORDER — CEPHALEXIN 250 MG/5ML PO SUSR
500.0000 mg | Freq: Two times a day (BID) | ORAL | Status: DC
  Administered 2013-01-07 – 2013-01-09 (×5): 500 mg via ORAL
  Filled 2013-01-07 (×6): qty 10

## 2013-01-07 MED ORDER — LIDOCAINE-EPINEPHRINE 1 %-1:100000 IJ SOLN
INTRAMUSCULAR | Status: AC
  Filled 2013-01-07: qty 1

## 2013-01-07 MED ORDER — MIDAZOLAM HCL 2 MG/2ML IJ SOLN: 1.0000 mg | INTRAMUSCULAR | Status: DC | PRN

## 2013-01-07 MED ORDER — LIDOCAINE HCL (CARDIAC) 20 MG/ML IV SOLN
INTRAVENOUS | Status: DC | PRN
  Administered 2013-01-07: 100 mg via INTRAVENOUS

## 2013-01-07 MED ORDER — INSULIN GLARGINE 100 UNIT/ML ~~LOC~~ SOLN
9.0000 [IU] | Freq: Every day | SUBCUTANEOUS | Status: DC
  Administered 2013-01-07 – 2013-01-08 (×2): 9 [IU] via SUBCUTANEOUS
  Filled 2013-01-07 (×3): qty 0.09

## 2013-01-07 MED ORDER — BACITRACIN ZINC 500 UNIT/GM EX OINT
TOPICAL_OINTMENT | CUTANEOUS | Status: AC
  Filled 2013-01-07: qty 15

## 2013-01-07 MED ORDER — HYDROCODONE-ACETAMINOPHEN 5-325 MG PO TABS
1.0000 | ORAL_TABLET | ORAL | Status: DC | PRN
  Administered 2013-01-07: 1 via ORAL
  Administered 2013-01-07 – 2013-01-09 (×4): 2 via ORAL
  Filled 2013-01-07 (×2): qty 2
  Filled 2013-01-07: qty 1
  Filled 2013-01-07 (×2): qty 2

## 2013-01-07 MED ORDER — LACTATED RINGERS IV SOLN
INTRAVENOUS | Status: DC | PRN
  Administered 2013-01-07 (×2): via INTRAVENOUS

## 2013-01-07 MED ORDER — VITAMIN D 50 MCG (2000 UT) PO CAPS: 2000.0000 [IU] | ORAL_CAPSULE | Freq: Every day | ORAL | Status: DC

## 2013-01-07 MED ORDER — POTASSIUM CHLORIDE IN NACL 20-0.45 MEQ/L-% IV SOLN
INTRAVENOUS | Status: DC
  Administered 2013-01-07 – 2013-01-08 (×2): via INTRAVENOUS
  Filled 2013-01-07 (×3): qty 1000

## 2013-01-07 MED ORDER — PROMETHAZINE HCL 25 MG/ML IJ SOLN: 6.2500 mg | INTRAMUSCULAR | Status: DC | PRN

## 2013-01-07 MED ORDER — PROPOFOL 10 MG/ML IV BOLUS
INTRAVENOUS | Status: DC | PRN
  Administered 2013-01-07: 200 mg via INTRAVENOUS

## 2013-01-07 MED ORDER — ALBUTEROL SULFATE HFA 108 (90 BASE) MCG/ACT IN AERS
INHALATION_SPRAY | RESPIRATORY_TRACT | Status: DC | PRN
  Administered 2013-01-07: 4 via RESPIRATORY_TRACT

## 2013-01-07 MED ORDER — VITAMIN D3 25 MCG (1000 UNIT) PO TABS
2000.0000 [IU] | ORAL_TABLET | Freq: Every day | ORAL | Status: DC
  Administered 2013-01-07 – 2013-01-09 (×3): 2000 [IU] via ORAL
  Filled 2013-01-07 (×3): qty 2

## 2013-01-07 MED ORDER — FENTANYL CITRATE 0.05 MG/ML IJ SOLN
INTRAMUSCULAR | Status: DC | PRN
  Administered 2013-01-07: 50 ug via INTRAVENOUS
  Administered 2013-01-07 (×2): 25 ug via INTRAVENOUS
  Administered 2013-01-07: 100 ug via INTRAVENOUS
  Administered 2013-01-07: 50 ug via INTRAVENOUS

## 2013-01-07 MED ORDER — OXYCODONE HCL 5 MG PO TABS: 5.0000 mg | ORAL_TABLET | Freq: Once | ORAL | Status: DC | PRN

## 2013-01-07 MED ORDER — LEFLUNOMIDE 20 MG PO TABS
20.0000 mg | ORAL_TABLET | Freq: Every day | ORAL | Status: DC
  Administered 2013-01-08 – 2013-01-09 (×2): 20 mg via ORAL
  Filled 2013-01-07 (×2): qty 1

## 2013-01-07 SURGICAL SUPPLY — 35 items
CANISTER SUCTION 2500CC (MISCELLANEOUS) ×2 IMPLANT
CLEANER TIP ELECTROSURG 2X2 (MISCELLANEOUS) ×2 IMPLANT
CONT SPEC 4OZ CLIKSEAL STRL BL (MISCELLANEOUS) ×2 IMPLANT
CORDS BIPOLAR (ELECTRODE) ×2 IMPLANT
COVER SURGICAL LIGHT HANDLE (MISCELLANEOUS) ×2 IMPLANT
DERMABOND ADVANCED (GAUZE/BANDAGES/DRESSINGS) ×1
DERMABOND ADVANCED .7 DNX12 (GAUZE/BANDAGES/DRESSINGS) ×1 IMPLANT
DRAIN SNY 10 ROU (WOUND CARE) ×2 IMPLANT
ELECT COATED BLADE 2.86 ST (ELECTRODE) ×2 IMPLANT
ELECT REM PT RETURN 9FT ADLT (ELECTROSURGICAL) ×2
ELECTRODE REM PT RTRN 9FT ADLT (ELECTROSURGICAL) ×1 IMPLANT
EVACUATOR SILICONE 100CC (DRAIN) ×2 IMPLANT
GAUZE SPONGE 4X4 16PLY XRAY LF (GAUZE/BANDAGES/DRESSINGS) ×6 IMPLANT
GLOVE BIO SURGEON STRL SZ 6.5 (GLOVE) ×2 IMPLANT
GLOVE BIOGEL PI IND STRL 7.0 (GLOVE) ×1 IMPLANT
GLOVE BIOGEL PI INDICATOR 7.0 (GLOVE) ×1
GLOVE SURG SS PI 6.5 STRL IVOR (GLOVE) ×2 IMPLANT
GLOVE SURG SS PI 7.0 STRL IVOR (GLOVE) ×2 IMPLANT
GOWN STRL NON-REIN LRG LVL3 (GOWN DISPOSABLE) ×6 IMPLANT
KIT BASIN OR (CUSTOM PROCEDURE TRAY) ×2 IMPLANT
KIT ROOM TURNOVER OR (KITS) ×2 IMPLANT
NS IRRIG 1000ML POUR BTL (IV SOLUTION) ×2 IMPLANT
PAD ARMBOARD 7.5X6 YLW CONV (MISCELLANEOUS) ×4 IMPLANT
PENCIL BUTTON HOLSTER BLD 10FT (ELECTRODE) IMPLANT
PENCIL FOOT CONTROL (ELECTRODE) ×2 IMPLANT
SOLUTION BETADINE 4OZ (MISCELLANEOUS) ×2 IMPLANT
SPONGE GAUZE 4X4 12PLY (GAUZE/BANDAGES/DRESSINGS) ×2 IMPLANT
STAPLER VISISTAT 35W (STAPLE) ×2 IMPLANT
SUT CHROMIC 3 0 PS 2 (SUTURE) ×2 IMPLANT
SUT ETHILON 2 0 FS 18 (SUTURE) ×2 IMPLANT
SUT SILK 2 0 SH CR/8 (SUTURE) ×2 IMPLANT
SUT SILK 3 0 REEL (SUTURE) ×2 IMPLANT
TAPE HY-TAPE 1X5Y PINK NS LF (GAUZE/BANDAGES/DRESSINGS) ×2 IMPLANT
TOWEL OR 17X26 10 PK STRL BLUE (TOWEL DISPOSABLE) ×2 IMPLANT
TRAY ENT MC OR (CUSTOM PROCEDURE TRAY) ×2 IMPLANT

## 2013-01-07 NOTE — Interval H&P Note (Signed)
History and Physical Interval Note:  01/07/2013 8:26 AM  Darryl Jones  has presented today for surgery, with the diagnosis of right neck mass  The various methods of treatment have been discussed with the patient and family. After consideration of risks, benefits and other options for treatment, the patient has consented to  Procedure(s): REMOVAL OF RIGHT SUBMANDIBULAR GLAND (Right) as a surgical intervention .  The patient's history has been reviewed, patient examined, no change in status, stable for surgery.  I have reviewed the patient's chart and labs.  Questions were answered to the patient's satisfaction.     Frantz Quattrone

## 2013-01-07 NOTE — Anesthesia Procedure Notes (Signed)
Procedure Name: Intubation Date/Time: 01/07/2013 8:37 AM Performed by: Margaree Mackintosh Pre-anesthesia Checklist: Patient identified, Timeout performed, Emergency Drugs available, Suction available and Patient being monitored Patient Re-evaluated:Patient Re-evaluated prior to inductionOxygen Delivery Method: Circle system utilized Preoxygenation: Pre-oxygenation with 100% oxygen Intubation Type: IV induction Ventilation: Mask ventilation without difficulty and Oral airway inserted - appropriate to patient size Laryngoscope size: Glidescope. Grade View: Grade I Tube type: Oral Tube size: 7.5 mm Number of attempts: 1 Airway Equipment and Method: Stylet and Video-laryngoscopy Placement Confirmation: ETT inserted through vocal cords under direct vision,  positive ETCO2 and breath sounds checked- equal and bilateral Secured at: 22 cm Tube secured with: Tape Dental Injury: Teeth and Oropharynx as per pre-operative assessment

## 2013-01-07 NOTE — Progress Notes (Signed)
Report given to diane rn as caregiver 

## 2013-01-07 NOTE — Anesthesia Preprocedure Evaluation (Signed)
Anesthesia Evaluation  Patient identified by MRN, date of birth, ID band Patient awake    Reviewed: Allergy & Precautions, H&P , NPO status , Patient's Chart, lab work & pertinent test results  Airway Mallampati: II TM Distance: >3 FB Neck ROM: Full    Dental   Pulmonary COPDformer smoker,  + rhonchi         Cardiovascular Rhythm:Regular Rate:Normal     Neuro/Psych    GI/Hepatic   Endo/Other  diabetes  Renal/GU      Musculoskeletal   Abdominal (+) + obese,   Peds  Hematology   Anesthesia Other Findings   Reproductive/Obstetrics                           Anesthesia Physical Anesthesia Plan  ASA: III  Anesthesia Plan: General   Post-op Pain Management:    Induction: Intravenous  Airway Management Planned: Oral ETT  Additional Equipment:   Intra-op Plan:   Post-operative Plan: Extubation in OR  Informed Consent: I have reviewed the patients History and Physical, chart, labs and discussed the procedure including the risks, benefits and alternatives for the proposed anesthesia with the patient or authorized representative who has indicated his/her understanding and acceptance.     Plan Discussed with: CRNA and Surgeon  Anesthesia Plan Comments:         Anesthesia Quick Evaluation

## 2013-01-07 NOTE — Transfer of Care (Signed)
Immediate Anesthesia Transfer of Care Note  Patient: Darryl Jones  Procedure(s) Performed: Procedure(s): REMOVAL OF RIGHT SUBMANDIBULAR GLAND (Right)  Patient Location: PACU  Anesthesia Type:General  Level of Consciousness: awake, alert  and oriented  Airway & Oxygen Therapy: Patient Spontanous Breathing and Patient connected to nasal cannula oxygen  Post-op Assessment: Report given to PACU RN and Post -op Vital signs reviewed and stable  Post vital signs: Reviewed and stable  Complications: No apparent anesthesia complications

## 2013-01-07 NOTE — Progress Notes (Signed)
01/07/2013 7:52 PM  [name] [phone]  Post-Op check   Temp:  [97 F (36.1 C)-98.5 F (36.9 C)] 98 F (36.7 C) (10/29 1729) Pulse Rate:  [65-77] 74 (10/29 1729) Resp:  [10-20] 16 (10/29 1729) BP: (121-151)/(60-84) 129/69 mmHg (10/29 1729) SpO2:  [93 %-100 %] 93 % (10/29 1729) Weight:  [85.594 kg (188 lb 11.2 oz)] 85.594 kg (188 lb 11.2 oz) (10/29 1355),     Intake/Output Summary (Last 24 hours) at 01/07/13 1952 Last data filed at 01/07/13 1729  Gross per 24 hour  Intake   1590 ml  Output    956 ml  Net    634 ml   Drain 31 ml  Results for orders placed during the hospital encounter of 01/07/13 (from the past 24 hour(s))  GLUCOSE, CAPILLARY     Status: Abnormal   Collection Time    01/07/13  6:19 AM      Result Value Range   Glucose-Capillary 125 (*) 70 - 99 mg/dL  GLUCOSE, CAPILLARY     Status: Abnormal   Collection Time    01/07/13 11:08 AM      Result Value Range   Glucose-Capillary 183 (*) 70 - 99 mg/dL   Comment 1 Notify RN    GLUCOSE, CAPILLARY     Status: Abnormal   Collection Time    01/07/13 12:13 PM      Result Value Range   Glucose-Capillary 180 (*) 70 - 99 mg/dL   Comment 1 Notify RN    GLUCOSE, CAPILLARY     Status: Abnormal   Collection Time    01/07/13  5:26 PM      Result Value Range   Glucose-Capillary 238 (*) 70 - 99 mg/dL   Comment 1 Notify RN      SUBJECTIVE:  Mod pain, controlled by meds.  No SOB.  Spont void.  Not yet out of bed  OBJECTIVE:  Awake, alert.  Wound flat.  Drain functional.  CN XII intact.  Cannot easily assess ramus mandibularis.  IMPRESSION:  Satisfactory check  PLAN:  OOB with assistance.  Advance diet as comfortable  Darryl Jones

## 2013-01-07 NOTE — Preoperative (Signed)
Beta Blockers   Reason not to administer Beta Blockers:Not Applicable 

## 2013-01-07 NOTE — Anesthesia Postprocedure Evaluation (Signed)
  Anesthesia Post-op Note  Patient: Darryl Jones  Procedure(s) Performed: Procedure(s): REMOVAL OF RIGHT SUBMANDIBULAR GLAND (Right)  Patient Location: PACU  Anesthesia Type:General  Level of Consciousness: awake and alert   Airway and Oxygen Therapy: Patient Spontanous Breathing  Post-op Pain: mild  Post-op Assessment: Post-op Vital signs reviewed, Patient's Cardiovascular Status Stable, Respiratory Function Stable, Patent Airway, No signs of Nausea or vomiting and Pain level controlled  Post-op Vital Signs: Reviewed and stable  Complications: No apparent anesthesia complications

## 2013-01-07 NOTE — Op Note (Signed)
OPERATIVE REPORT  DATE OF SURGERY: 01/07/2013  PATIENT:  Darryl Jones,  57 y.o. male  PRE-OPERATIVE DIAGNOSIS:  RIGHT NECK MASS  POST-OPERATIVE DIAGNOSIS:  RIGHT NECK MASS  PROCEDURE:  Procedure(s): REMOVAL OF RIGHT SUBMANDIBULAR GLAND  SURGEON:  Susy Frizzle, MD  ASSISTANTS: Aquilla Hacker, PA  ANESTHESIA:   General   EBL:  30 ml  DRAINS: 10 French round  LOCAL MEDICATIONS USED:  None  SPECIMEN:  Right submandibular gland. Adjacent tissue for frozen section negative for malignancy.  COUNTS:  Correct  PROCEDURE DETAILS: The patient was taken to the operating room and placed on the operating table in the supine position. Following induction of general endotracheal anesthesia, the right neck was prepped and draped in a standard fashion. A transverse incision was outlined about 2 fingerbreadths below the mandible. Electrocautery was used to incise the skin and subcutaneous tissue as well as through the platysma layer. A subplatysmal flap was developed superiorly up towards the mandible. The fascia overlying the gland with the contained  mandibular branch of the facial nerve was reflected up superiorly. The entire gland was firm and fibrotic. There was a fibrotic type tissue reaction surrounding the gland in all directions. The facial vessels were identified, ligated between clamps and divided. As the gland was brought inferiorly, blunt dissection was used to separate glandular tissue from the surrounding fibrotic tissue. Eventually the mylohyoid muscle was identified and reflected superiorly. The muscle itself was also abnormal. The lingual nerve was identified. The ganglion was sacrificed. The duct was never identified as it was surrounded and fibrotic tissue. All this tissue was divided using cautery and also with silk ligatures. The facial artery was identified, ligated between clamps and divided. The digastric muscle was identified, the hypoglossal was not it was felt to be deep to  the level of dissection. At one point, there was some stimulation of the hypoglossal nerve. The gland was sent for pathologic evaluation. Prior to the removal of the gland and some surrounding fibrotic tissue was sent for frozen section to make sure there is no evidence of malignancy and there was none. After gland was removed, the wound was irrigated with saline and cautery and bipolar cautery were used to complete hemostasis. The drain was left in the wound exiting through the lower stab incision and secured in place with nylon suture. The platysma layer was reapproximated with chromic suture. Subcuticular closure was accomplished with running chromic and Dermabond was used on the skin. The patient was awakened, extubated and transferred to recovery in stable condition.   PATIENT DISPOSITION:  To PACU, stable

## 2013-01-08 LAB — GLUCOSE, CAPILLARY
Glucose-Capillary: 186 mg/dL — ABNORMAL HIGH (ref 70–99)
Glucose-Capillary: 178 mg/dL — ABNORMAL HIGH (ref 70–99)

## 2013-01-08 NOTE — Progress Notes (Signed)
Doing well over all. No change yet in the pain. He is able to drink well but not eat solids yet.  JP functioning. Skin healthy. No swelling. CN XII and VII intact.   Will plan to keep JP in for at least 4-5 days. He may go home with the drain whenever he feels ready.

## 2013-01-08 NOTE — Progress Notes (Signed)
Inpatient Diabetes Program Recommendations  AACE/ADA: New Consensus Statement on Inpatient Glycemic Control (2013)  Target Ranges:  Prepandial:   less than 140 mg/dL      Peak postprandial:   less than 180 mg/dL (1-2 hours)      Critically ill patients:  140 - 180 mg/dL   Results for EAGAN, SHIFFLETT (MRN [id number]) as of 01/08/2013 13:59  Ref. Range 01/07/2013 06:19 01/07/2013 11:08 01/07/2013 12:13 01/07/2013 17:26 01/07/2013 21:20 01/08/2013 08:04 01/08/2013 12:09  Glucose-Capillary Latest Range: 70-99 mg/dL 409 (H) 811 (H) 914 (H) 238 (H) 224 (H) 207 (H) 171 (H)    Inpatient Diabetes Program Recommendations Correction (SSI): While inpatient, please order Novolog moderate correction scale ACHS. HgbA1C: Please consider ordering an A1C to determine glycemic control over the past 2-3 months.  Note: Patient has a history of diabetes and takes Lantus 9 units QHS, Amaryl 4 mg QAM, and Metformin 500 mg BID as an outpatient for diabetes management.  Currently, patient is ordered to receive  Lantus 9 units QHS, Amaryl 4 mg QAM, and Metformin 500 mg BID for inpatient glycemic control.  Noted patient received Dexamethasone 8 mg yesterdat morning at 8:45 and is now ordered to receive Prednisone 7.5 mg daily (which he takes at home as an outpatient). Blood glucose ranged from 125-238 mg/dl yesterday and fasting blood glucose was 207 mg/dl today.  While inpatient, please order Novolog moderate correction scale ACHS.  Also, please consider ordering an A1C to determine glycemic control over the past 2-3 months.  Will continue to follow.  Thanks, Orlando Penner, RN, MSN, CCRN Diabetes Coordinator Inpatient Diabetes Program [phone] (Team Pager) [phone] ([facility] office) [phone] [facility] office)

## 2013-01-09 LAB — GLUCOSE, CAPILLARY
Glucose-Capillary: 92 mg/dL (ref 70–99)
Glucose-Capillary: 126 mg/dL — ABNORMAL HIGH (ref 70–99)

## 2013-01-09 NOTE — Progress Notes (Signed)
Discharge note. Discharge instructions given to pt and pt's wife. Rx's given and care of surgical site reviewed. Pt and wife educated again on how to empty and recharge JP drain. Pt aware of follow up appointment. Pt and wife asked appropriate questions. Pt ready for discharge.

## 2013-01-09 NOTE — Discharge Summary (Signed)
Physician Discharge Summary  Patient ID: Darryl Jones MRN: [id number] DOB/AGE: [birth year] 57 y.o.  Admit date: 01/07/2013 Discharge date: 01/09/2013  Admission Diagnoses:Submandibular mass  Discharge Diagnoses:  Active Problems:   * No active hospital problems. *   Discharged Condition: good  Hospital Course: no comlications  Consults: none  Significant Diagnostic Studies: none  Treatments: surgery: Excision left submandibular gland  Discharge Exam: Blood pressure 149/86, pulse 62, temperature 97.4 F (36.3 C), temperature source Oral, resp. rate 20, height 5\' 6"  (1.676 m), weight 188 lb 11.2 oz (85.594 kg), SpO2 98.00%. PHYSICAL EXAM: Surgical site looks excellent. No swelling. JP in place.   Disposition: 01-Home or Self Care  Discharge Orders   Future Orders Complete By Expires   Diet - low sodium heart healthy  As directed    Increase activity slowly  As directed        Medication List         acetaminophen 500 MG tablet  Commonly known as:  TYLENOL  Take 500 mg by mouth every 6 (six) hours as needed for pain.     bacitracin ointment  Apply 1 application topically daily. To toes.     cephALEXin 500 MG capsule  Commonly known as:  KEFLEX  Take 1 capsule (500 mg total) by mouth 3 (three) times daily.     COMBIVENT RESPIMAT 20-100 MCG/ACT Aers respimat  Generic drug:  Ipratropium-Albuterol  Inhale 1 puff into the lungs 4 (four) times daily as needed. For copd     glimepiride 4 MG tablet  Commonly known as:  AMARYL  Take 4 mg by mouth daily before breakfast.     HYDROcodone-acetaminophen 7.5-325 MG per tablet  Commonly known as:  NORCO  Take 1 tablet by mouth every 6 (six) hours as needed for pain.     insulin glargine 100 UNIT/ML injection  Commonly known as:  LANTUS  Inject 9 Units into the skin at bedtime.     leflunomide 20 MG tablet  Commonly known as:  ARAVA  Take 20 mg by mouth daily.     metFORMIN 500 MG tablet  Commonly known as:   GLUCOPHAGE  Take 500 mg by mouth 2 (two) times daily with a meal.     multivitamin with minerals Tabs tablet  Take 1 tablet by mouth daily.     predniSONE 5 MG tablet  Commonly known as:  DELTASONE  Take 7.5 mg by mouth daily.     promethazine 25 MG suppository  Commonly known as:  PHENERGAN  Place 1 suppository (25 mg total) rectally every 6 (six) hours as needed for nausea.     rosuvastatin 20 MG tablet  Commonly known as:  CRESTOR  Take 20 mg by mouth daily.     Vitamin D 2000 UNITS Caps  Take 2,000 Units by mouth daily.     vitamin E 400 UNIT capsule  Take 400 Units by mouth daily.           Follow-up Information   Follow up with Serena Colonel, MD. Schedule an appointment as soon as possible for a visit on 01/12/2013.   Specialty:  Otolaryngology   Contact information:   [address] [place] [address] [phone]       Signed: Serena Colonel 01/09/2013, 8:11 AM

## 2013-01-13 ENCOUNTER — Encounter (HOSPITAL_COMMUNITY): Payer: Self-pay | Admitting: Otolaryngology

## 2013-02-17 ENCOUNTER — Encounter: Payer: Self-pay | Admitting: Cardiothoracic Surgery

## 2013-02-20 ENCOUNTER — Ambulatory Visit: Payer: Self-pay

## 2013-03-23 ENCOUNTER — Other Ambulatory Visit: Payer: Self-pay | Admitting: Podiatry

## 2013-03-27 LAB — WOUND CULTURE

## 2013-07-21 ENCOUNTER — Other Ambulatory Visit: Payer: Self-pay | Admitting: Internal Medicine

## 2013-07-21 DIAGNOSIS — Z87891 Personal history of nicotine dependence: Secondary | ICD-10-CM

## 2013-07-21 DIAGNOSIS — Z87898 Personal history of other specified conditions: Secondary | ICD-10-CM

## 2013-07-29 ENCOUNTER — Other Ambulatory Visit: Payer: Medicare Other

## 2013-07-30 ENCOUNTER — Ambulatory Visit
Admission: RE | Admit: 2013-07-30 | Discharge: 2013-07-30 | Disposition: A | Discharge status: Home / Self Care | Payer: Medicare Other | Source: Ambulatory Visit | Attending: Internal Medicine | Admitting: Internal Medicine

## 2013-07-30 DIAGNOSIS — Z87891 Personal history of nicotine dependence: Secondary | ICD-10-CM

## 2013-07-30 DIAGNOSIS — Z87898 Personal history of other specified conditions: Secondary | ICD-10-CM

## 2013-07-30 MED ORDER — IOHEXOL 300 MG/ML  SOLN
75.0000 mL | Freq: Once | INTRAMUSCULAR | Status: AC | PRN
  Administered 2013-07-30: 75 mL via INTRAVENOUS

## 2013-08-06 ENCOUNTER — Other Ambulatory Visit (HOSPITAL_COMMUNITY): Payer: Self-pay | Admitting: Internal Medicine

## 2013-08-06 DIAGNOSIS — R918 Other nonspecific abnormal finding of lung field: Secondary | ICD-10-CM

## 2013-08-11 ENCOUNTER — Ambulatory Visit: Payer: Self-pay | Admitting: Internal Medicine

## 2013-08-18 ENCOUNTER — Ambulatory Visit (HOSPITAL_COMMUNITY): Payer: Medicare Other

## 2014-01-13 IMAGING — RF DG ESOPHAGUS
11 series · 17 of 17 positions shown · non-contrast
Comparison: None

FLUOROSCOPY TIME:  1 min 24 seconds

CLINICAL DATA: Dysphasia

EXAM:
ESOPHOGRAM / BARIUM SWALLOW / BARIUM TABLET STUDY
TECHNIQUE: Combined double contrast and single contrast examination performed
using effervescent crystals, thick barium liquid, and thin barium
liquid. The patient was observed with fluoroscopy swallowing a 13mm
barium sulphate tablet.

[Series 1: run · 1 of 1 slices shown (1 of 11)]
[im 1/1]
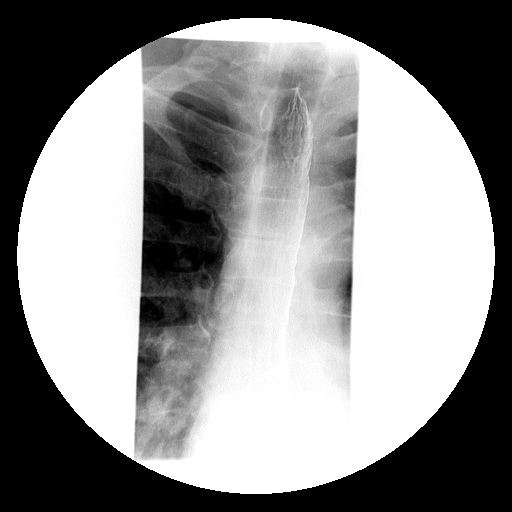

[Series 2: run · 1 of 1 slices shown (2 of 11)]
[im 1/1]
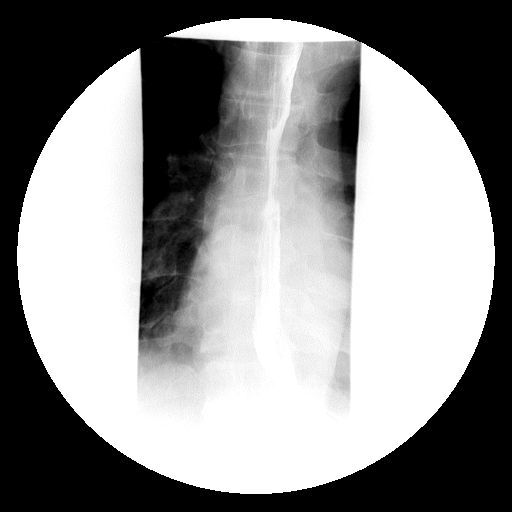

[Series 3: run · 1 of 1 slices shown (3 of 11)]
[im 1/1]
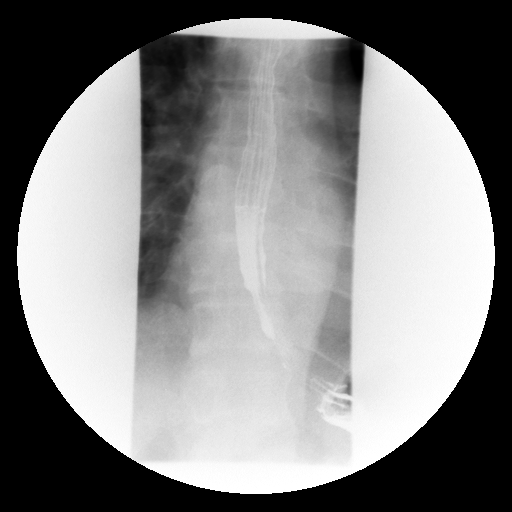

[Series 4: run · 1 of 1 slices shown (4 of 11)]
[im 1/1]
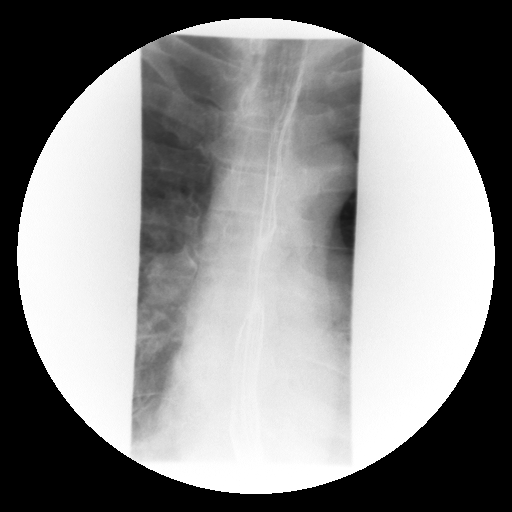

[Series 5: run · 1 of 1 slices shown (5 of 11)]
[im 1/1]
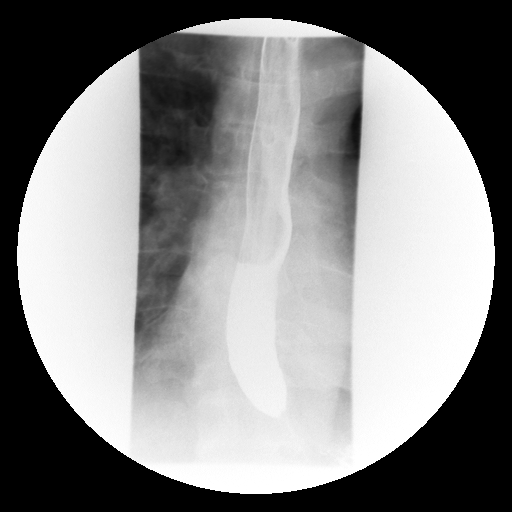

[Series 6: run · 1 of 1 slices shown (6 of 11)]
[im 1/1]
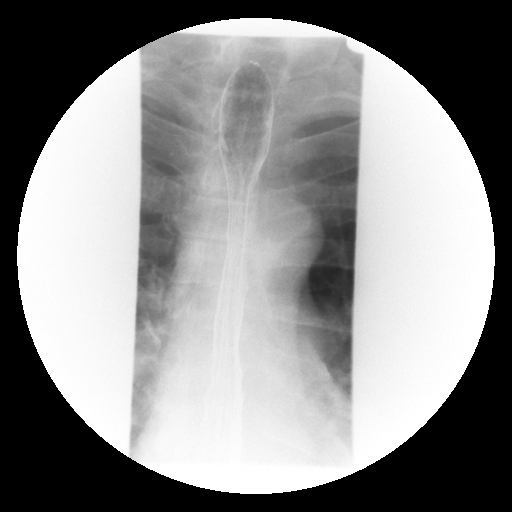

[Series 7: run · 1 of 1 slices shown (7 of 11)]
[im 1/1]
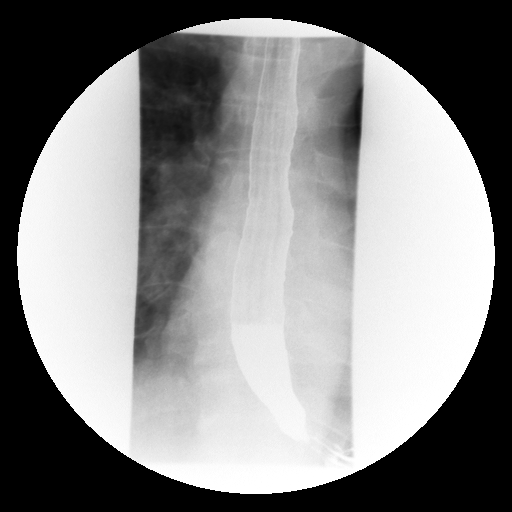

[Series 8: run · 1 of 1 slices shown (8 of 11)]
[im 1/1]
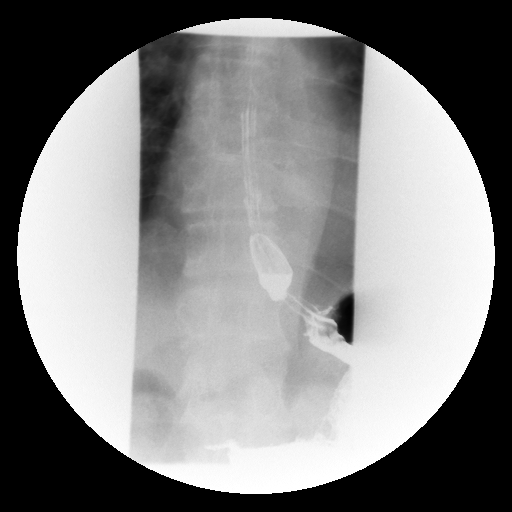

[Series 9: run · 1 of 1 slices shown (9 of 11)]
[im 1/1]
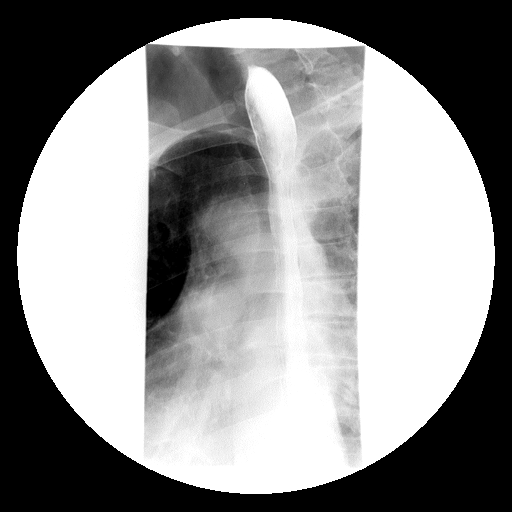

[Series 10: run · 1 of 1 slices shown (10 of 11)]
[im 1/1]
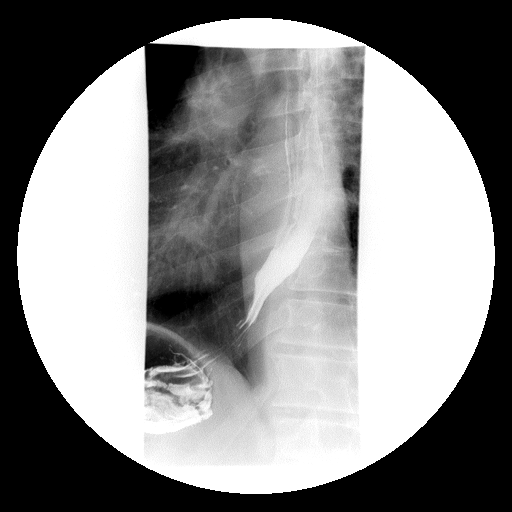

[Series 11: run · 7 of 7 slices shown (11 of 11)]
[im 1/7]
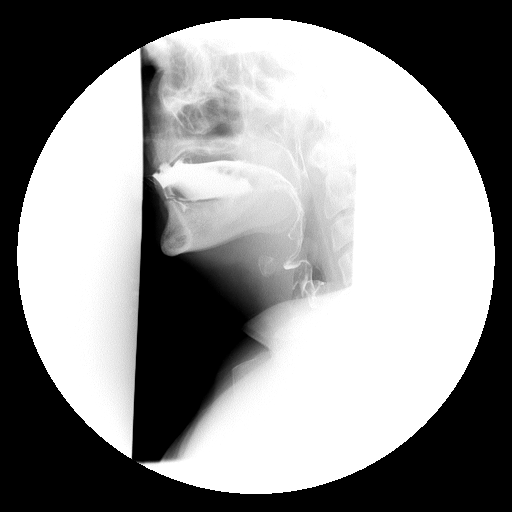
[im 2/7]
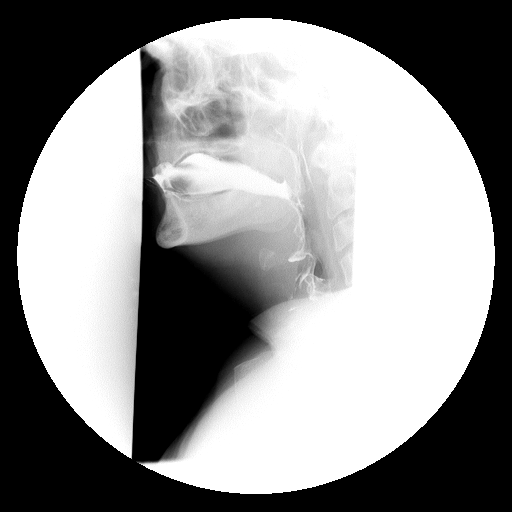
[im 3/7]
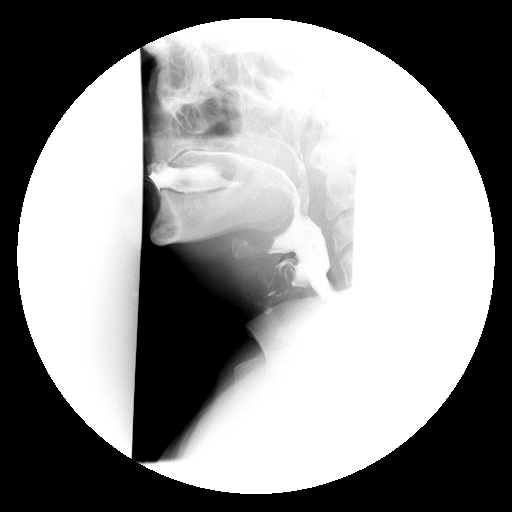
[im 4/7]
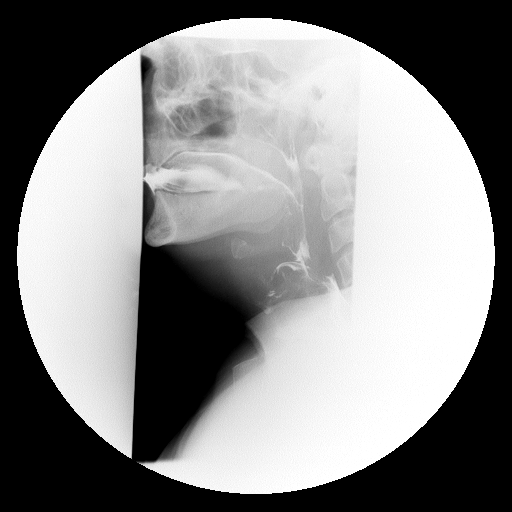
[im 5/7]
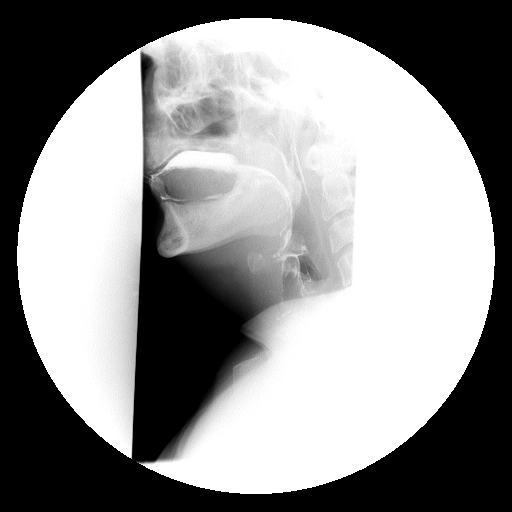
[im 6/7]
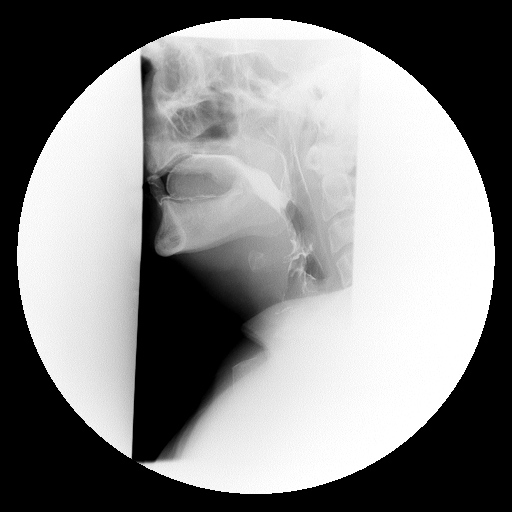
[im 7/7]
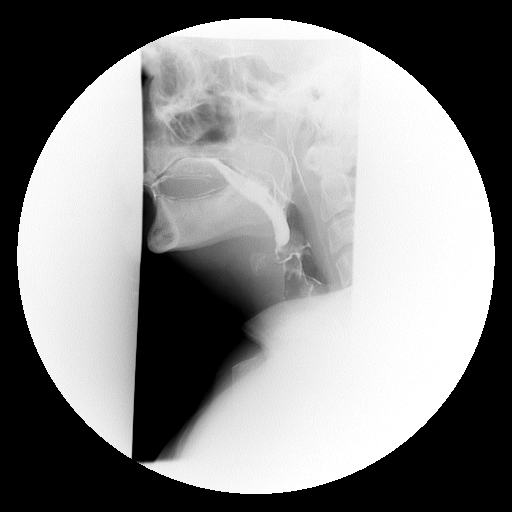

[17 of 17 positions shown; findings below may reference images not displayed]

FINDINGS: The esophagus is normal in course and caliber. No stricture or mass
identified. The patient ingested a 13 mm barium tablet which easily
passed through the esophagus. No reflux identified.
IMPRESSION: 1.  Normal esophagram.

## 2014-01-21 IMAGING — CR DG CHEST 2V
2 series · 2 of 2 positions shown · non-contrast
Comparison: 04/11/2011

CLINICAL DATA: Preop, emphysema

EXAM:
CHEST  2 VIEW

[w chest pa]
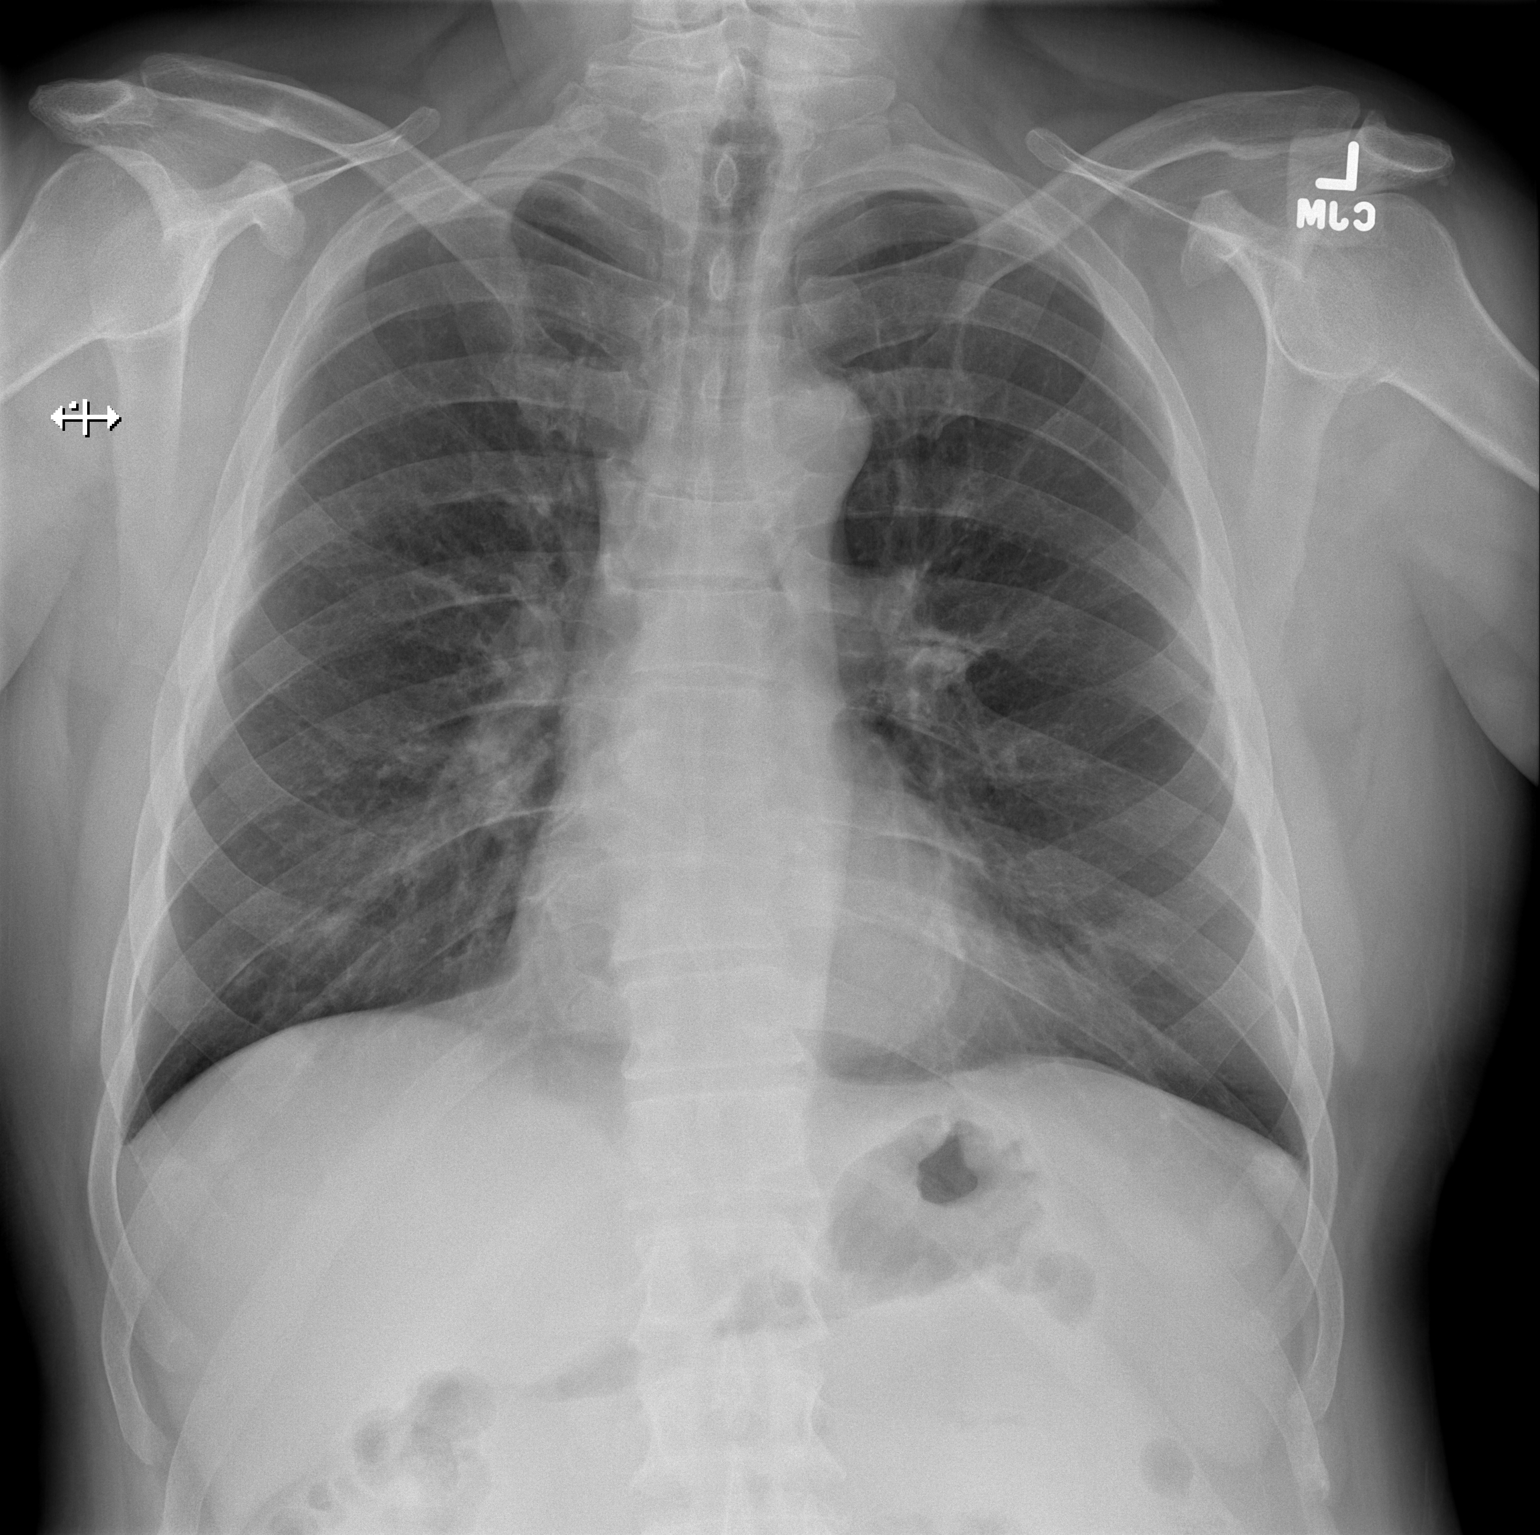

[w chest lat]
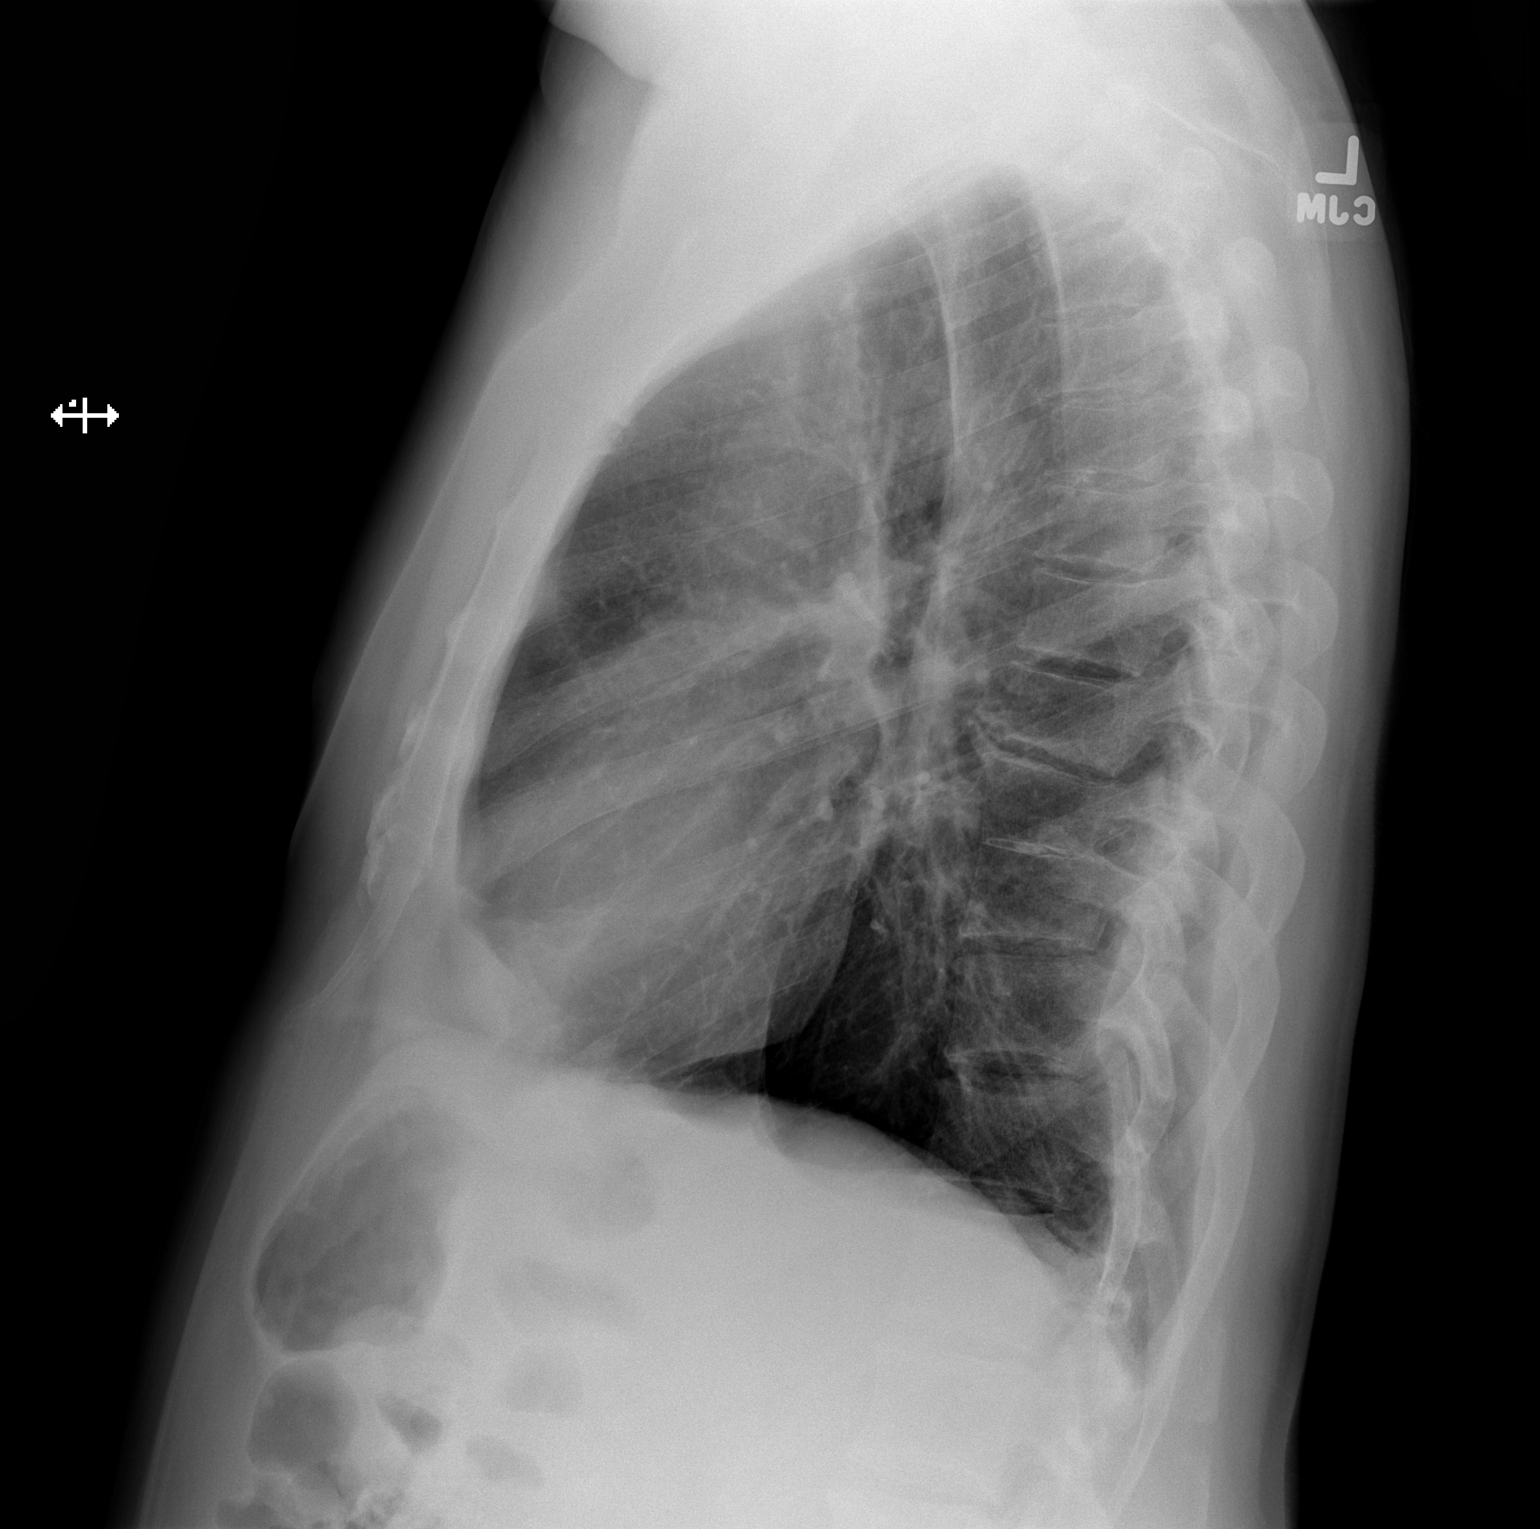

[2 of 2 positions shown; findings below may reference images not displayed]

FINDINGS: The heart size and mediastinal contours are within normal limits. No
acute infiltrate or pulmonary edema. Stable mild degenerative
changes thoracic spine.
IMPRESSION: No active cardiopulmonary disease.

## 2017-01-02 ENCOUNTER — Other Ambulatory Visit: Payer: Self-pay | Admitting: Internal Medicine

## 2017-01-02 DIAGNOSIS — R0602 Shortness of breath: Secondary | ICD-10-CM

## 2017-01-02 DIAGNOSIS — F17200 Nicotine dependence, unspecified, uncomplicated: Secondary | ICD-10-CM

## 2017-01-08 ENCOUNTER — Ambulatory Visit
Admission: RE | Admit: 2017-01-08 | Discharge: 2017-01-08 | Disposition: A | Discharge status: Home / Self Care | Payer: Medicare Other | Source: Ambulatory Visit | Attending: Internal Medicine | Admitting: Internal Medicine

## 2017-01-08 DIAGNOSIS — R0602 Shortness of breath: Secondary | ICD-10-CM

## 2017-01-08 DIAGNOSIS — F17200 Nicotine dependence, unspecified, uncomplicated: Secondary | ICD-10-CM

## 2017-01-08 MED ORDER — IOPAMIDOL (ISOVUE-300) INJECTION 61%
75.0000 mL | Freq: Once | INTRAVENOUS | Status: AC | PRN
  Administered 2017-01-08: 75 mL via INTRAVENOUS

## 2017-01-10 ENCOUNTER — Other Ambulatory Visit: Payer: PRIVATE HEALTH INSURANCE

## 2017-03-12 DIAGNOSIS — Z950 Presence of cardiac pacemaker: Secondary | ICD-10-CM

## 2017-03-12 HISTORY — DX: Presence of cardiac pacemaker: Z95.0

## 2017-06-06 DIAGNOSIS — T280XXA Burn of mouth and pharynx, initial encounter: Secondary | ICD-10-CM | POA: Insufficient documentation

## 2017-06-06 DIAGNOSIS — K219 Gastro-esophageal reflux disease without esophagitis: Secondary | ICD-10-CM | POA: Insufficient documentation

## 2017-10-27 ENCOUNTER — Emergency Department: Payer: Medicare Other

## 2017-10-27 ENCOUNTER — Inpatient Hospital Stay
Admission: EM | Admit: 2017-10-27 | Discharge: 2017-10-29 | DRG: 243 | Disposition: A | Discharge status: Home / Self Care | Payer: Medicare Other | Attending: Internal Medicine | Admitting: Internal Medicine

## 2017-10-27 ENCOUNTER — Other Ambulatory Visit: Payer: Self-pay

## 2017-10-27 DIAGNOSIS — N179 Acute kidney failure, unspecified: Secondary | ICD-10-CM | POA: Diagnosis present

## 2017-10-27 DIAGNOSIS — Z886 Allergy status to analgesic agent status: Secondary | ICD-10-CM | POA: Diagnosis not present

## 2017-10-27 DIAGNOSIS — H9193 Unspecified hearing loss, bilateral: Secondary | ICD-10-CM | POA: Diagnosis present

## 2017-10-27 DIAGNOSIS — I959 Hypotension, unspecified: Secondary | ICD-10-CM | POA: Diagnosis not present

## 2017-10-27 DIAGNOSIS — Z85828 Personal history of other malignant neoplasm of skin: Secondary | ICD-10-CM | POA: Diagnosis not present

## 2017-10-27 DIAGNOSIS — Z79899 Other long term (current) drug therapy: Secondary | ICD-10-CM

## 2017-10-27 DIAGNOSIS — R1013 Epigastric pain: Secondary | ICD-10-CM | POA: Diagnosis present

## 2017-10-27 DIAGNOSIS — R079 Chest pain, unspecified: Secondary | ICD-10-CM | POA: Diagnosis present

## 2017-10-27 DIAGNOSIS — R55 Syncope and collapse: Secondary | ICD-10-CM | POA: Diagnosis present

## 2017-10-27 DIAGNOSIS — E119 Type 2 diabetes mellitus without complications: Secondary | ICD-10-CM | POA: Diagnosis present

## 2017-10-27 DIAGNOSIS — J449 Chronic obstructive pulmonary disease, unspecified: Secondary | ICD-10-CM | POA: Diagnosis present

## 2017-10-27 DIAGNOSIS — Z950 Presence of cardiac pacemaker: Secondary | ICD-10-CM

## 2017-10-27 DIAGNOSIS — I442 Atrioventricular block, complete: Secondary | ICD-10-CM | POA: Diagnosis not present

## 2017-10-27 DIAGNOSIS — R570 Cardiogenic shock: Secondary | ICD-10-CM | POA: Diagnosis not present

## 2017-10-27 DIAGNOSIS — I4510 Unspecified right bundle-branch block: Secondary | ICD-10-CM | POA: Diagnosis present

## 2017-10-27 DIAGNOSIS — M810 Age-related osteoporosis without current pathological fracture: Secondary | ICD-10-CM | POA: Diagnosis present

## 2017-10-27 DIAGNOSIS — Z87891 Personal history of nicotine dependence: Secondary | ICD-10-CM | POA: Diagnosis not present

## 2017-10-27 DIAGNOSIS — E785 Hyperlipidemia, unspecified: Secondary | ICD-10-CM | POA: Diagnosis present

## 2017-10-27 DIAGNOSIS — Z888 Allergy status to other drugs, medicaments and biological substances status: Secondary | ICD-10-CM

## 2017-10-27 DIAGNOSIS — L719 Rosacea, unspecified: Secondary | ICD-10-CM | POA: Diagnosis present

## 2017-10-27 LAB — CBC
HCT: 45.5 % (ref 40.0–52.0)
Hemoglobin: 15.6 g/dL (ref 13.0–18.0)
MCH: 30.4 pg (ref 26.0–34.0)
MCHC: 34.3 g/dL (ref 32.0–36.0)
MCV: 88.6 fL (ref 80.0–100.0)
PLATELETS: 196 10*3/uL (ref 150–440)
RBC: 5.13 MIL/uL (ref 4.40–5.90)
RDW: 15.4 % — AB (ref 11.5–14.5)
WBC: 7.9 10*3/uL (ref 3.8–10.6)

## 2017-10-27 LAB — BASIC METABOLIC PANEL
Anion gap: 5 (ref 5–15)
BUN: 19 mg/dL (ref 8–23)
CALCIUM: 9 mg/dL (ref 8.9–10.3)
CO2: 27 mmol/L (ref 22–32)
CREATININE: 1.62 mg/dL — AB (ref 0.61–1.24)
Chloride: 109 mmol/L (ref 98–111)
GFR calc Af Amer: 51 mL/min — ABNORMAL LOW (ref >60)
GFR calc non Af Amer: 44 mL/min — ABNORMAL LOW (ref >60)
GLUCOSE: 170 mg/dL — AB (ref 70–99)
Potassium: 5.1 mmol/L (ref 3.5–5.1)
Sodium: 141 mmol/L (ref 135–145)

## 2017-10-27 LAB — MAGNESIUM: Magnesium: 2.3 mg/dL (ref 1.7–2.4)

## 2017-10-27 LAB — PHOSPHORUS: PHOSPHORUS: 2.7 mg/dL (ref 2.5–4.6)

## 2017-10-27 LAB — TROPONIN I
Troponin I: <0.03 ng/mL (ref <0.03)
Troponin I: <0.03 ng/mL (ref <0.03)

## 2017-10-27 LAB — MRSA PCR SCREENING: MRSA by PCR: NEGATIVE

## 2017-10-27 LAB — TSH: TSH: 1.883 u[IU]/mL (ref 0.350–4.500)

## 2017-10-27 MED ORDER — ATORVASTATIN CALCIUM 20 MG PO TABS
20.0000 mg | ORAL_TABLET | Freq: Every day | ORAL | Status: DC
  Administered 2017-10-29: 20 mg via ORAL
  Filled 2017-10-27: qty 1

## 2017-10-27 MED ORDER — DOPAMINE-DEXTROSE 3.2-5 MG/ML-% IV SOLN
0.0000 ug/kg/min | INTRAVENOUS | Status: DC
  Administered 2017-10-27: 5 ug/kg/min via INTRAVENOUS

## 2017-10-27 MED ORDER — DOCUSATE SODIUM 100 MG PO CAPS
100.0000 mg | ORAL_CAPSULE | Freq: Two times a day (BID) | ORAL | Status: DC
  Filled 2017-10-27 (×3): qty 1

## 2017-10-27 MED ORDER — HYDROXYCHLOROQUINE SULFATE 200 MG PO TABS
200.0000 mg | ORAL_TABLET | Freq: Two times a day (BID) | ORAL | Status: DC
  Administered 2017-10-27 – 2017-10-29 (×3): 200 mg via ORAL
  Filled 2017-10-27 (×5): qty 1

## 2017-10-27 MED ORDER — ATROPINE SULFATE 1 MG/ML IJ SOLN: 1.0000 mg | Freq: Once | INTRAMUSCULAR | Status: DC

## 2017-10-27 MED ORDER — ENOXAPARIN SODIUM 40 MG/0.4ML ~~LOC~~ SOLN
40.0000 mg | SUBCUTANEOUS | Status: DC
  Administered 2017-10-27 – 2017-10-28 (×2): 40 mg via SUBCUTANEOUS
  Filled 2017-10-27 (×2): qty 0.4

## 2017-10-27 MED ORDER — ONDANSETRON HCL 4 MG/2ML IJ SOLN
4.0000 mg | Freq: Four times a day (QID) | INTRAMUSCULAR | Status: DC | PRN
  Administered 2017-10-27: 4 mg via INTRAVENOUS
  Filled 2017-10-27: qty 2

## 2017-10-27 MED ORDER — CALCIUM CARBONATE ANTACID 500 MG PO CHEW: 1000.0000 mg | CHEWABLE_TABLET | ORAL | Status: DC | PRN

## 2017-10-27 MED ORDER — ATROPINE SULFATE 1 MG/10ML IJ SOSY
1.0000 mg | PREFILLED_SYRINGE | Freq: Once | INTRAMUSCULAR | Status: AC
  Administered 2017-10-27: 1 mg via INTRAVENOUS

## 2017-10-27 MED ORDER — DOPAMINE-DEXTROSE 3.2-5 MG/ML-% IV SOLN
INTRAVENOUS | Status: AC
  Administered 2017-10-27: 5 ug/kg/min via INTRAVENOUS
  Filled 2017-10-27: qty 250

## 2017-10-27 MED ORDER — ORAL CARE MOUTH RINSE
15.0000 mL | Freq: Two times a day (BID) | OROMUCOSAL | Status: DC
  Administered 2017-10-27: 15 mL via OROMUCOSAL

## 2017-10-27 MED ORDER — ATROPINE SULFATE 1 MG/10ML IJ SOSY
PREFILLED_SYRINGE | INTRAMUSCULAR | Status: AC
  Administered 2017-10-27: 0.5 mg via INTRAVENOUS
  Filled 2017-10-27: qty 10

## 2017-10-27 MED ORDER — ATROPINE SULFATE 1 MG/10ML IJ SOSY
1.0000 mg | PREFILLED_SYRINGE | Freq: Once | INTRAMUSCULAR | Status: AC
  Administered 2017-10-27: 0.5 mg via INTRAVENOUS

## 2017-10-27 MED ORDER — SODIUM CHLORIDE 0.9 % IV BOLUS
1000.0000 mL | Freq: Once | INTRAVENOUS | Status: AC
  Administered 2017-10-28: 1000 mL via INTRAVENOUS

## 2017-10-27 MED ORDER — LATANOPROST 0.005 % OP SOLN
1.0000 [drp] | Freq: Every day | OPHTHALMIC | Status: DC
  Administered 2017-10-27 – 2017-10-28 (×2): 1 [drp] via OPHTHALMIC
  Filled 2017-10-27: qty 2.5

## 2017-10-27 MED ORDER — ALBUTEROL SULFATE (2.5 MG/3ML) 0.083% IN NEBU: 3.0000 mL | INHALATION_SOLUTION | Freq: Four times a day (QID) | RESPIRATORY_TRACT | Status: DC | PRN

## 2017-10-27 MED ORDER — ONDANSETRON HCL 4 MG PO TABS: 4.0000 mg | ORAL_TABLET | Freq: Four times a day (QID) | ORAL | Status: DC | PRN

## 2017-10-27 NOTE — Progress Notes (Signed)
Dr. Saralyn Pilar telephoned and spoke with patient's wife.

## 2017-10-27 NOTE — ED Provider Notes (Signed)
[facility] Emergency Department Provider Note   ____________________________________________   First MD Initiated Contact with Patient 10/27/17 1657     (approximate)  I have reviewed the triage vital signs and the nursing notes.   HISTORY  Chief Complaint Chest Pain    HPI Darryl Jones is a 62 y.o. male she complained of burning in his chest and shortness of breath today.  He was swimmy headed or lightheaded.  CBG was 192 usually is only 120.  Patient arrives in room complains of slight burning in his chest but then says he feels fine all the burning shortness of breath everything else is gone.  On the monitor patient is in third-degree AV block a rate of 29.  Sitting down he again says he feels fine.  Past Medical History:  Diagnosis Date  . 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 COPD (chronic obstructive pulmonary disease) ([place])   . Diabetes mellitus without complication ([place])   . Elevated liver enzymes    Hx: of "due to methotrexate"  . 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 Hyperlipidemia    Hx: of  . Osteoporosis    Hx: of  . Rosacea, acne    Hx: of  . Shortness of breath     There are no active problems to display for this patient.   Past Surgical History:  Procedure Laterality Date  . ABDOMINAL EXPLORATION SURGERY    . HERNIA REPAIR     umbilical  . KNEE SURGERY    . SUBMANDIBULAR GLAND EXCISION Right 01/07/2013   Dr Constance Holster  . SUBMANDIBULAR GLAND EXCISION Right 01/07/2013   Procedure: REMOVAL OF RIGHT SUBMANDIBULAR GLAND;  Surgeon: Izora Gala, MD;  Location: [facility];  Service: ENT;  Laterality: Right;  . TONSILLECTOMY      Prior to Admission medications   Medication Sig Start Date End Date Taking? Authorizing Provider  Albuterol Sulfate (PROAIR RESPICLICK) 423 (90 Base) MCG/ACT AEPB Inhale 2 puffs into the lungs every 6 (six) hours as needed  (respiratory difficulties).   Yes [provider]  atorvastatin (LIPITOR) 20 MG tablet Take 20 mg by mouth daily.   Yes [provider]  Calcium Carbonate Antacid 1177 MG CHEW Chew 1 tablet by mouth every 4 (four) hours as needed (heartburn / indigestion).   Yes [provider]  hydroxychloroquine (PLAQUENIL) 200 MG tablet Take 200 mg by mouth 2 (two) times daily.   Yes [provider]  latanoprost (XALATAN) 0.005 % ophthalmic solution Place 1 drop into both eyes at bedtime.   Yes [provider]  Tiotropium Bromide-Olodaterol (STIOLTO RESPIMAT) 2.5-2.5 MCG/ACT AERS Inhale 2 puffs into the lungs every morning.   Yes [provider]  Tofacitinib Citrate (XELJANZ) 5 MG TABS Take 5 mg by mouth 2 (two) times daily.   Yes [provider]    Allergies Arava [leflunomide]; Humira [adalimumab]; Methotrexate derivatives; and Aspirin  Family History  Problem Relation Age of Onset  . Diabetes Sister   . Heart disease Sister   . Kidney disease Sister   . Diabetes Brother     Social History Social History   Tobacco Use  . Smoking status: Former Smoker    Types: Cigarettes    Last attempt to quit: 06/08/2010    Years since quitting: 7.3  . Smokeless tobacco:  Never Used  . Tobacco comment: "quit smoking cigarettes in 2012"  Substance Use Topics  . Alcohol use: No  . Drug use: No    Review of Systems  Constitutional: No fever/chills Eyes: No visual changes. ENT: No sore throat. Cardiovascular: HPI Respiratory: Denies shortness of breath. Gastrointestinal: No abdominal pain.  No nausea, no vomiting.  No diarrhea.  No constipation. Genitourinary: Negative for dysuria. Musculoskeletal: Negative for back pain. Skin: Negative for rash. Neurological: Negative for headaches, focal weakness   ____________________________________________   PHYSICAL EXAM:  VITAL SIGNS: ED Triage Vitals  Enc Vitals Group     BP --      Pulse  Rate 10/27/17 1645 85     Resp 10/27/17 1645 18     Temp --      Temp Source 10/27/17 1645 Oral     SpO2 10/27/17 1645 99 %     Weight 10/27/17 1642 215 lb (97.5 kg)     Height 10/27/17 1642 5\' 6"  (1.676 m)     Head Circumference --      Peak Flow --      Pain Score 10/27/17 1642 4     Pain Loc --      Pain Edu? --      Excl. in [place]? --     Constitutional: Alert and oriented. Well appearing and in no acute distress. Eyes: Conjunctivae are normal.  Head: Atraumatic. Nose: No congestion/rhinnorhea. Mouth/Throat: Mucous membranes are moist.  Oropharynx non-erythematous. Neck: No stridor.   Cardiovascular: Low rate, regular rhythm. Grossly normal heart sounds.  Good peripheral circulation. Respiratory: Normal respiratory effort.  No retractions. Lungs CTAB. Gastrointestinal: Soft and nontender. No distention. No abdominal bruits. No CVA tenderness. Musculoskeletal: No lower extremity tenderness some edema. . Neurologic:  Normal speech and language. No gross focal neurologic deficits are appreciated.  Skin:  Skin is warm, dry and intact. Psychiatric: Mood and affect are normal. Speech and behavior are normal.  ____________________________________________   LABS (all labs ordered are listed, but only abnormal results are displayed)  Labs Reviewed  BASIC METABOLIC PANEL - Abnormal; Notable for the following components:      Result Value   Glucose, Bld 170 (*)    Creatinine, Ser 1.62 (*)    GFR calc non Af Amer 44 (*)    GFR calc Af Amer 51 (*)    All other components within normal limits  CBC - Abnormal; Notable for the following components:   RDW 15.4 (*)    All other components within normal limits  TROPONIN I   ____________________________________________  EKG  EKG read interpreted by me shows sinus bradycardia right bundle branch block third-degree AV block flipped T waves ____________________________________________  RADIOLOGY  ED MD interpretation: X-ray read  interpreted by me looks normal  Official radiology report(s): Dg Chest Portable 1 View  Result Date: 10/27/2017 CLINICAL DATA:  Chest pain and shortness of breath EXAM: PORTABLE CHEST 1 VIEW COMPARISON:  Chest radiograph May 15, 2015 and chest CT January 08, 2017 FINDINGS: There is postoperative change with scarring in the right apex region. There is also postoperative change in the right base region. There is no edema or consolidation. Heart size and pulmonary vascularity are normal. No adenopathy. No bone lesions. No pneumothorax. IMPRESSION: Postoperative changes on the right with scarring right apex. No edema or consolidation. Stable cardiac silhouette. Electronically Signed   By: Lowella Grip III M.D.   On: 10/27/2017 17:48    ____________________________________________   PROCEDURES  Procedure(s) performed:   Procedures  Critical Care performed:   ____________________________________________   INITIAL IMPRESSION / ASSESSMENT AND PLAN / ED COURSE   Patient with Dr. Henrene Pastor shows since his pressures okay is not having a chest pain and he feels well we will admit the patient plan on putting a pacemaker in later.        ____________________________________________   FINAL CLINICAL IMPRESSION(S) / ED DIAGNOSES  Final diagnoses:  Third degree AV block [facility])     ED Discharge Orders    None       Note:  This document was prepared using Dragon voice recognition software and may include unintentional dictation errors.    Nena Polio, MD 10/27/17 [phone]

## 2017-10-27 NOTE — Consult Note (Signed)
PULMONARY / CRITICAL CARE MEDICINE   Name: Darryl Jones MRN: [id number] DOB: Nov 07, 1955    ADMISSION DATE:  10/27/2017   CONSULTATION DATE:  10/27/17  REFERRING MD:  Dr Margaretmary Eddy  REASON: 3rd degree heart block  HISTORY OF PRESENT ILLNESS:   62 y/o male with T2DM, COPD and hyperlipidemia who presented to the ED with heart burn and dizziness; found to be in a 3rd degree heart block. He is awaiting a pacemaker placement. Cardiology aware and following. Blood pressure is stable.   PAST MEDICAL HISTORY :  He  has a past medical history of Arthritis, Cancer ([place]), COPD (chronic obstructive pulmonary disease) ([place]), Diabetes mellitus without complication ([place]), Elevated liver enzymes, Hearing loss, Hyperlipidemia, Osteoporosis, Rosacea, acne, and Shortness of breath.  PAST SURGICAL HISTORY: He  has a past surgical history that includes Knee surgery; Hernia repair; Tonsillectomy; Submandibular gland excision (Right, 01/07/2013); Abdominal exploration surgery; and Submandibular gland excision (Right, 01/07/2013).  Allergies  Allergen Reactions  . Arava [Leflunomide]   . Humira [Adalimumab]   . Methotrexate Derivatives Other (See Comments)    Elevated lft's  . Aspirin Rash    No current facility-administered medications on file prior to encounter.    Current Outpatient Medications on File Prior to Encounter  Medication Sig  . Albuterol Sulfate (PROAIR RESPICLICK) 962 (90 Base) MCG/ACT AEPB Inhale 2 puffs into the lungs every 6 (six) hours as needed (respiratory difficulties).  Marland Kitchen atorvastatin (LIPITOR) 20 MG tablet Take 20 mg by mouth daily.  . Calcium Carbonate Antacid 1177 MG CHEW Chew 1 tablet by mouth every 4 (four) hours as needed (heartburn / indigestion).  . hydroxychloroquine (PLAQUENIL) 200 MG tablet Take 200 mg by mouth 2 (two) times daily.  Marland Kitchen latanoprost (XALATAN) 0.005 % ophthalmic solution Place 1 drop into both eyes at bedtime.  . Tiotropium Bromide-Olodaterol (STIOLTO  RESPIMAT) 2.5-2.5 MCG/ACT AERS Inhale 2 puffs into the lungs every morning.  . Tofacitinib Citrate (XELJANZ) 5 MG TABS Take 5 mg by mouth 2 (two) times daily.    FAMILY HISTORY:  His family history includes Diabetes in his brother and sister; Heart disease in his sister; Kidney disease in his sister.  SOCIAL HISTORY: He  reports that he quit smoking about 7 years ago. His smoking use included cigarettes. He has never used smokeless tobacco. He reports that he does not drink alcohol or use drugs.  REVIEW OF SYSTEMS:   Constitutional: Negative for fever and chills.  HENT: Negative for congestion and rhinorrhea.  Eyes: Negative for redness and visual disturbance.  Respiratory: Negative for shortness of breath and wheezing.  Cardiovascular: Negative for chest pain and palpitations.  Gastrointestinal: Negative  for nausea , vomiting but reports mild epigastric discomfort Genitourinary: Negative for dysuria and urgency.  Endocrine: Denies polyuria, polyphagia and heat intolerance Musculoskeletal: Negative for myalgias and arthralgias.  Skin: Negative for pallor and wound.  Neurological: Negative for dizziness and headaches   SUBJECTIVE:   VITAL SIGNS: BP (!) 125/111   Pulse (!) 29   Temp 97.7 F (36.5 C) (Axillary)   Resp 17   Ht 5\' 6"  (1.676 m)   Wt 90.7 kg   SpO2 98%   BMI 32.27 kg/m   HEMODYNAMICS:    VENTILATOR SETTINGS:    INTAKE / OUTPUT: No intake/output data recorded.  PHYSICAL EXAMINATION: General: Lying in bed, no acute distress Neuro: Alert and oriented x4, no focal deficits HEENT: PERRLA, trachea midline, no JVD Cardiovascular: Apical pulse regular, severely bradycardic with heart rate between 28  and 30, no murmur regurg or gallop, +2 pulses bilaterally, no edema Lungs: Bilateral breath sounds without any wheezes or rhonchi Abdomen: Obese, nontender, normal bowel sounds Musculoskeletal: No joint deformities Skin: Warm and dry  LABS:  BMET Recent Labs   Lab 10/27/17 1653  NA 141  K 5.1  CL 109  CO2 27  BUN 19  CREATININE 1.62*  GLUCOSE 170*    Electrolytes Recent Labs  Lab 10/27/17 1653  CALCIUM 9.0    CBC Recent Labs  Lab 10/27/17 1732  WBC 7.9  HGB 15.6  HCT 45.5  PLT 196    Coag's No results for input(s): APTT, INR in the last 168 hours.  Sepsis Markers No results for input(s): LATICACIDVEN, PROCALCITON, O2SATVEN in the last 168 hours.  ABG No results for input(s): PHART, PCO2ART, PO2ART in the last 168 hours.  Liver Enzymes No results for input(s): AST, ALT, ALKPHOS, BILITOT, ALBUMIN in the last 168 hours.  Cardiac Enzymes Recent Labs  Lab 10/27/17 1653  TROPONINI <0.03    Glucose No results for input(s): GLUCAP in the last 168 hours.  Imaging Dg Chest Portable 1 View  Result Date: 10/27/2017 CLINICAL DATA:  Chest pain and shortness of breath EXAM: PORTABLE CHEST 1 VIEW COMPARISON:  Chest radiograph May 15, 2015 and chest CT January 08, 2017 FINDINGS: There is postoperative change with scarring in the right apex region. There is also postoperative change in the right base region. There is no edema or consolidation. Heart size and pulmonary vascularity are normal. No adenopathy. No bone lesions. No pneumothorax. IMPRESSION: Postoperative changes on the right with scarring right apex. No edema or consolidation. Stable cardiac silhouette. Electronically Signed   By: Lowella Grip III M.D.   On: 10/27/2017 17:48   STUDIES:  None  CULTURES: None  ANTIBIOTICS: None  SIGNIFICANT EVENTS: 10/27/2017: Admitted  LINES/TUBES: Peripheral IVs  DISCUSSION: 62 year old male with type 2 diabetes, COPD and rheumatoid arthritis admitted with a new onset third-degree heart block  ASSESSMENT  3rd degree AV block T2DM COPD Epigastric pain  PLAN Pacer pads at bed side Hemodynamics per ICU policy Cardiology following Cycle cardiac enzymes Continue all home medications Tums as needed for  epigastric pain Notify cardiology start for any sudden onset chest pain, dizziness, and nausea/ vomiting  FAMILY  - Updates: Patient and spouse updated at bedside  [facility] ANP-BC Pulmonary and Critical Care Medicine [facility] Pager [phone] or [phone]  NB: This document was prepared using Dragon voice recognition software and may include unintentional dictation errors.   10/27/2017, 7:56 PM

## 2017-10-27 NOTE — ED Triage Notes (Signed)
Pt arrives to ED c/o of burning in chest and SOB. States swimmy headed today. Family member is persistent that his CBG is "way above normal." states normal is around 120 and today was 192. Alert, oriented. No distress noted.

## 2017-10-27 NOTE — H&P (Signed)
[place] [facility] [place] NAME: Darryl Jones    MR#:  [id number]  DATE OF BIRTH:  04/13/1955  DATE OF ADMISSION:  10/27/2017  PRIMARY CARE PHYSICIAN: Lavone Orn, MD   REQUESTING/REFERRING PHYSICIAN: Nena Polio, MD  CHIEF COMPLAINT:   Heart burn and light headed  HISTORY OF PRESENT ILLNESS:  Darryl Jones  is a 62 y.o. male with a known history of COPD, hyperlipidemia, diabetes mellitus, rosacea and other medical problems is presenting to the ED with a chief complaint of heartburn and lightheadedness.  Patient CBG was at 192.  EKG has revealed third-degree AV block at a rate of 29.  Atropine was given with no improvement in the heart rate.  Wife and daughter at bedside.  Transcutaneous pacer pads were placed next to the bed and Dr. Saralyn Pilar has recommended to admit the patient to stepdown unit for close monitoring and considering pacemaker placement  PAST MEDICAL HISTORY:   Past Medical History:  Diagnosis Date  . 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 COPD (chronic obstructive pulmonary disease) ([place])   . Diabetes mellitus without complication ([place])   . Elevated liver enzymes    Hx: of "due to methotrexate"  . 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 Hyperlipidemia    Hx: of  . Osteoporosis    Hx: of  . Rosacea, acne    Hx: of  . Shortness of breath     PAST SURGICAL HISTOIRY:   Past Surgical History:  Procedure Laterality Date  . ABDOMINAL EXPLORATION SURGERY    . HERNIA REPAIR     umbilical  . KNEE SURGERY    . SUBMANDIBULAR GLAND EXCISION Right 01/07/2013   Dr Constance Holster  . SUBMANDIBULAR GLAND EXCISION Right 01/07/2013   Procedure: REMOVAL OF RIGHT SUBMANDIBULAR GLAND;  Surgeon: Izora Gala, MD;  Location: [place] [facility];  Service: ENT;  Laterality: Right;  . TONSILLECTOMY      SOCIAL HISTORY:   Social History   Tobacco Use  . Smoking status:  Former Smoker    Types: Cigarettes    Last attempt to quit: 06/08/2010    Years since quitting: 7.3  . Smokeless tobacco: Never Used  . Tobacco comment: "quit smoking cigarettes in 2012"  Substance Use Topics  . Alcohol use: No    FAMILY HISTORY:   Family History  Problem Relation Age of Onset  . Diabetes Sister   . Heart disease Sister   . Kidney disease Sister   . Diabetes Brother     DRUG ALLERGIES:   Allergies  Allergen Reactions  . Arava [Leflunomide]   . Humira [Adalimumab]   . Methotrexate Derivatives Other (See Comments)    Elevated lft's  . Aspirin Rash    REVIEW OF SYSTEMS:  CONSTITUTIONAL: No fever, fatigue or weakness.  EYES: No blurred or double vision.  EARS, NOSE, AND THROAT: No tinnitus or ear pain.  RESPIRATORY: No cough, shortness of breath, wheezing or hemoptysis.  CARDIOVASCULAR: No chest pain, orthopnea, edema.  Reporting dizzy spells and heartburn GASTROINTESTINAL: No nausea, vomiting, diarrhea or abdominal pain.  GENITOURINARY: No dysuria, hematuria.  ENDOCRINE: No polyuria, nocturia,  HEMATOLOGY: No anemia, easy bruising or bleeding SKIN: No rash or lesion. MUSCULOSKELETAL: No joint pain or arthritis.   NEUROLOGIC: No tingling, numbness, weakness.  PSYCHIATRY: No anxiety or  depression.   MEDICATIONS AT HOME:   Prior to Admission medications   Medication Sig Start Date End Date Taking? Authorizing Provider  Albuterol Sulfate (PROAIR RESPICLICK) 160 (90 Base) MCG/ACT AEPB Inhale 2 puffs into the lungs every 6 (six) hours as needed (respiratory difficulties).   Yes [provider]  atorvastatin (LIPITOR) 20 MG tablet Take 20 mg by mouth daily.   Yes [provider]  Calcium Carbonate Antacid 1177 MG CHEW Chew 1 tablet by mouth every 4 (four) hours as needed (heartburn / indigestion).   Yes [provider]  hydroxychloroquine (PLAQUENIL) 200 MG tablet Take 200 mg by mouth 2 (two) times daily.   Yes [provider]  latanoprost (XALATAN) 0.005 % ophthalmic solution Place 1 drop into both eyes at bedtime.   Yes [provider]  Tiotropium Bromide-Olodaterol (STIOLTO RESPIMAT) 2.5-2.5 MCG/ACT AERS Inhale 2 puffs into the lungs every morning.   Yes [provider]  Tofacitinib Citrate (XELJANZ) 5 MG TABS Take 5 mg by mouth 2 (two) times daily.   Yes [provider]      VITAL SIGNS:  Blood pressure (!) 125/111, pulse (!) 29, temperature 97.7 F (36.5 C), temperature source Axillary, resp. rate 17, height 5\' 6"  (1.676 m), weight 90.7 kg, SpO2 98 %.  PHYSICAL EXAMINATION:  GENERAL:  62 y.o.-year-old patient lying in the bed with no acute distress.  EYES: Pupils equal, round, reactive to light and accommodation. No scleral icterus. Extraocular muscles intact.  HEENT: Head atraumatic, normocephalic. Oropharynx and nasopharynx clear.  NECK:  Supple, no jugular venous distention. No thyroid enlargement, no tenderness.  LUNGS: Normal breath sounds bilaterally, no wheezing, rales,rhonchi or crepitation. No use of accessory muscles of respiration.  CARDIOVASCULAR: Bradycardia no murmurs, rubs, or gallops.  ABDOMEN: Soft, nontender, nondistended. Bowel sounds present.  EXTREMITIES: No pedal edema, cyanosis, or clubbing.  NEUROLOGIC: Cranial nerves II through XII are intact. Sensation intact. Gait not checked.  PSYCHIATRIC: The patient is alert and oriented x 3.  SKIN: No obvious rash, lesion, or ulcer.   LABORATORY PANEL:   CBC Recent Labs  Lab 10/27/17 1732  WBC 7.9  HGB 15.6  HCT 45.5  PLT 196   ------------------------------------------------------------------------------------------------------------------  Chemistries  Recent Labs  Lab 10/27/17 1653  NA 141  K 5.1  CL 109  CO2 27  GLUCOSE 170*  BUN 19  CREATININE 1.62*  CALCIUM 9.0    ------------------------------------------------------------------------------------------------------------------  Cardiac Enzymes Recent Labs  Lab 10/27/17 1653  TROPONINI <0.03   ------------------------------------------------------------------------------------------------------------------  RADIOLOGY:  Dg Chest Portable 1 View  Result Date: 10/27/2017 CLINICAL DATA:  Chest pain and shortness of breath EXAM: PORTABLE CHEST 1 VIEW COMPARISON:  Chest radiograph May 15, 2015 and chest CT January 08, 2017 FINDINGS: There is postoperative change with scarring in the right apex region. There is also postoperative change in the right base region. There is no edema or consolidation. Heart size and pulmonary vascularity are normal. No adenopathy. No bone lesions. No pneumothorax. IMPRESSION: Postoperative changes on the right with scarring right apex. No edema or consolidation. Stable cardiac silhouette. Electronically Signed   By: Lowella Grip III M.D.   On: 10/27/2017 17:48    EKG:   Orders placed or performed during the hospital encounter of 10/27/17  . ED EKG within 10 minutes  . ED EKG within 10 minutes  . EKG 12-Lead  . EKG 12-Lead    IMPRESSION AND PLAN:   Darryl Jones  is a 61 y.o. male with a  known history of COPD, hyperlipidemia, diabetes mellitus, rosacea and other medical problems is presenting to the ED with a chief complaint of heartburn and lightheadedness.  Patient CBG was at 192.  EKG has revealed third-degree AV block at a rate of 29.  Atropine was given with no improvement in the heart rate.  Wife and daughter at bedside.  Transcutaneous pacer pads were placed next to the bed and Dr. Saralyn Pilar has recommended to admit the patient to stepdown unit for close monitoring and considering pacemaker placement  #Near syncope from third-degree heart block Admit to stepdown unit Transcutaneous pacing if patient is hemodynamically unstable and symptomatic N.p.o. after  midnight for possible PPM placement in a.m. Cardiology consult placed and discussed with Dr. Saralyn Pilar  #AKI Gentle hydration with IV fluids and avoid nephrotoxins and monitor renal function closely  #Chronic COPD no exacerbation at this time Continue nebulizer treatments  #Hyperlipidemia continue statin  #Diabetes mellitus Accu-Cheks and sliding scale insulin  GI and DVT prophylaxis    All the records are reviewed and case discussed with ED provider. Management plans discussed with the patient, family and they are in agreement.  CODE STATUS: *fc , daughter and patient's wife are the healthcare POA  TOTAL CRITICAL CARE TIME TAKING CARE OF THIS PATIENT: 45 minutes.   Note: This dictation was prepared with Dragon dictation along with smaller phrase technology. Any transcriptional errors that result from this process are unintentional.  Nicholes Mango M.D on 10/27/2017 at 7:10 PM  Between 7am to 6pm - Pager - [phone]  After 6pm go to [email or web address] - password [email or web address] [facility]  [facility]  Office  [phone]  CC: Primary care physician; Lavone Orn, MD

## 2017-10-28 ENCOUNTER — Encounter
Admission: EM | Disposition: A | Discharge status: Home / Self Care | Payer: Self-pay | Source: Home / Self Care | Attending: Internal Medicine

## 2017-10-28 ENCOUNTER — Inpatient Hospital Stay: Payer: Medicare Other

## 2017-10-28 ENCOUNTER — Encounter: Payer: Self-pay | Admitting: Cardiology

## 2017-10-28 ENCOUNTER — Inpatient Hospital Stay: Payer: Medicare Other | Admitting: Certified Registered"

## 2017-10-28 DIAGNOSIS — I959 Hypotension, unspecified: Secondary | ICD-10-CM

## 2017-10-28 DIAGNOSIS — R570 Cardiogenic shock: Secondary | ICD-10-CM

## 2017-10-28 DIAGNOSIS — Z950 Presence of cardiac pacemaker: Secondary | ICD-10-CM

## 2017-10-28 DIAGNOSIS — I442 Atrioventricular block, complete: Secondary | ICD-10-CM

## 2017-10-28 HISTORY — DX: Atrioventricular block, complete: I44.2

## 2017-10-28 HISTORY — PX: PACEMAKER INSERTION: SHX728

## 2017-10-28 HISTORY — DX: Presence of cardiac pacemaker: Z95.0

## 2017-10-28 LAB — COMPREHENSIVE METABOLIC PANEL
ALBUMIN: 3.4 g/dL — AB (ref 3.5–5.0)
ALT: 107 U/L — AB (ref 0–44)
ANION GAP: 9 (ref 5–15)
AST: 75 U/L — ABNORMAL HIGH (ref 15–41)
Alkaline Phosphatase: 27 U/L — ABNORMAL LOW (ref 38–126)
BILIRUBIN TOTAL: 0.9 mg/dL (ref 0.3–1.2)
BUN: 22 mg/dL (ref 8–23)
CALCIUM: 8.2 mg/dL — AB (ref 8.9–10.3)
CO2: 25 mmol/L (ref 22–32)
CREATININE: 1.41 mg/dL — AB (ref 0.61–1.24)
Chloride: 111 mmol/L (ref 98–111)
GFR calc non Af Amer: 52 mL/min — ABNORMAL LOW (ref >60)
GFR, EST AFRICAN AMERICAN: 60 mL/min — AB (ref >60)
GLUCOSE: 93 mg/dL (ref 70–99)
Potassium: 4.8 mmol/L (ref 3.5–5.1)
Sodium: 145 mmol/L (ref 135–145)
TOTAL PROTEIN: 6 g/dL — AB (ref 6.5–8.1)

## 2017-10-28 LAB — GLUCOSE, CAPILLARY
Glucose-Capillary: 131 mg/dL — ABNORMAL HIGH (ref 70–99)
Glucose-Capillary: 76 mg/dL (ref 70–99)
Glucose-Capillary: 75 mg/dL (ref 70–99)
Glucose-Capillary: 83 mg/dL (ref 70–99)

## 2017-10-28 LAB — URINE DRUG SCREEN, QUALITATIVE (ARMC ONLY)
Amphetamines, Ur Screen: NOT DETECTED
BARBITURATES, UR SCREEN: NOT DETECTED
COCAINE METABOLITE, UR ~~LOC~~: NOT DETECTED
Cannabinoid 50 Ng, Ur ~~LOC~~: NOT DETECTED
MDMA (Ecstasy)Ur Screen: NOT DETECTED
METHADONE SCREEN, URINE: NOT DETECTED
Opiate, Ur Screen: NOT DETECTED
Phencyclidine (PCP) Ur S: NOT DETECTED
TRICYCLIC, UR SCREEN: NOT DETECTED

## 2017-10-28 LAB — CBC
HCT: 41.3 % (ref 40.0–52.0)
HEMOGLOBIN: 14 g/dL (ref 13.0–18.0)
MCH: 30.1 pg (ref 26.0–34.0)
MCHC: 34 g/dL (ref 32.0–36.0)
MCV: 88.7 fL (ref 80.0–100.0)
PLATELETS: 172 10*3/uL (ref 150–440)
RBC: 4.66 MIL/uL (ref 4.40–5.90)
RDW: 15.6 % — ABNORMAL HIGH (ref 11.5–14.5)
WBC: 11 10*3/uL — ABNORMAL HIGH (ref 3.8–10.6)

## 2017-10-28 LAB — HEMOGLOBIN A1C
Hgb A1c MFr Bld: 6.2 % — ABNORMAL HIGH (ref 4.8–5.6)
MEAN PLASMA GLUCOSE: 131.24 mg/dL

## 2017-10-28 LAB — TROPONIN I

## 2017-10-28 SURGERY — INSERTION, CARDIAC PACEMAKER
Anesthesia: General | Laterality: Left | Wound class: Clean

## 2017-10-28 SURGERY — TEMPORARY PACEMAKER INSERTION

## 2017-10-28 MED ORDER — HEPARIN (PORCINE) IN NACL 1000-0.9 UT/500ML-% IV SOLN
INTRAVENOUS | Status: DC | PRN
  Administered 2017-10-28: 500 mL

## 2017-10-28 MED ORDER — DEXMEDETOMIDINE HCL IN NACL 200 MCG/50ML IV SOLN
INTRAVENOUS | Status: DC | PRN
  Administered 2017-10-28: 12 ug via INTRAVENOUS
  Administered 2017-10-28: 8 ug via INTRAVENOUS

## 2017-10-28 MED ORDER — CEFAZOLIN SODIUM-DEXTROSE 1-4 GM/50ML-% IV SOLN
1.0000 g | Freq: Four times a day (QID) | INTRAVENOUS | Status: AC
  Administered 2017-10-28 – 2017-10-29 (×3): 1 g via INTRAVENOUS
  Filled 2017-10-28 (×3): qty 50

## 2017-10-28 MED ORDER — METOCLOPRAMIDE HCL 5 MG/ML IJ SOLN
5.0000 mg | Freq: Four times a day (QID) | INTRAMUSCULAR | Status: DC | PRN
  Administered 2017-10-28: 5 mg via INTRAVENOUS
  Filled 2017-10-28: qty 2

## 2017-10-28 MED ORDER — GLYCOPYRROLATE 0.2 MG/ML IJ SOLN
INTRAMUSCULAR | Status: DC | PRN
  Administered 2017-10-28: 0.2 mg via INTRAVENOUS

## 2017-10-28 MED ORDER — LIDOCAINE HCL (PF) 2 % IJ SOLN
INTRAMUSCULAR | Status: AC
  Filled 2017-10-28: qty 10

## 2017-10-28 MED ORDER — SODIUM CHLORIDE 0.9 % IV SOLN
INTRAVENOUS | Status: DC | PRN
  Administered 2017-10-28: 60 mL

## 2017-10-28 MED ORDER — MIDAZOLAM HCL 2 MG/2ML IJ SOLN
INTRAMUSCULAR | Status: AC
  Filled 2017-10-28: qty 2

## 2017-10-28 MED ORDER — SODIUM CHLORIDE 0.9 % IV SOLN
INTRAVENOUS | Status: AC | PRN
  Administered 2017-10-28: 50 mL/h via INTRAVENOUS

## 2017-10-28 MED ORDER — SODIUM CHLORIDE 0.9 % IV SOLN
INTRAVENOUS | Status: AC | PRN
  Administered 2017-10-28: 10 mL/h via INTRAVENOUS

## 2017-10-28 MED ORDER — TOFACITINIB CITRATE 5 MG PO TABS
5.0000 mg | ORAL_TABLET | Freq: Two times a day (BID) | ORAL | Status: DC
  Administered 2017-10-28: 5 mg via ORAL
  Filled 2017-10-28 (×2): qty 1

## 2017-10-28 MED ORDER — PROPOFOL 10 MG/ML IV BOLUS
INTRAVENOUS | Status: AC
  Filled 2017-10-28: qty 20

## 2017-10-28 MED ORDER — KETAMINE HCL 50 MG/ML IJ SOLN
INTRAMUSCULAR | Status: DC | PRN
  Administered 2017-10-28: 25 mg via INTRAMUSCULAR
  Administered 2017-10-28: 25 mg via INTRAVENOUS

## 2017-10-28 MED ORDER — MIDAZOLAM HCL 5 MG/5ML IJ SOLN
INTRAMUSCULAR | Status: DC | PRN
  Administered 2017-10-28: 2 mg via INTRAVENOUS

## 2017-10-28 MED ORDER — SODIUM CHLORIDE 0.9 % IV SOLN
INTRAVENOUS | Status: DC
  Administered 2017-10-28 (×2): via INTRAVENOUS

## 2017-10-28 MED ORDER — INSULIN ASPART 100 UNIT/ML ~~LOC~~ SOLN: 0.0000 [IU] | Freq: Every day | SUBCUTANEOUS | Status: DC

## 2017-10-28 MED ORDER — CEFAZOLIN SODIUM-DEXTROSE 2-3 GM-%(50ML) IV SOLR
INTRAVENOUS | Status: DC | PRN
  Administered 2017-10-28: 2 g via INTRAVENOUS

## 2017-10-28 MED ORDER — CEFAZOLIN SODIUM 1 G IJ SOLR
INTRAMUSCULAR | Status: AC
  Filled 2017-10-28: qty 20

## 2017-10-28 MED ORDER — GLYCOPYRROLATE 0.2 MG/ML IJ SOLN
INTRAMUSCULAR | Status: AC
  Filled 2017-10-28: qty 1

## 2017-10-28 MED ORDER — ONDANSETRON HCL 4 MG/2ML IJ SOLN: 4.0000 mg | Freq: Once | INTRAMUSCULAR | Status: DC | PRN

## 2017-10-28 MED ORDER — PROPOFOL 500 MG/50ML IV EMUL
INTRAVENOUS | Status: DC | PRN
  Administered 2017-10-28: 75 ug/kg/min via INTRAVENOUS

## 2017-10-28 MED ORDER — KETAMINE HCL 50 MG/ML IJ SOLN
INTRAMUSCULAR | Status: AC
  Filled 2017-10-28: qty 10

## 2017-10-28 MED ORDER — FENTANYL CITRATE (PF) 100 MCG/2ML IJ SOLN: 25.0000 ug | INTRAMUSCULAR | Status: DC | PRN

## 2017-10-28 MED ORDER — DEXMEDETOMIDINE HCL IN NACL 200 MCG/50ML IV SOLN
INTRAVENOUS | Status: AC
  Filled 2017-10-28: qty 50

## 2017-10-28 MED ORDER — ACETAMINOPHEN 325 MG PO TABS: 325.0000 mg | ORAL_TABLET | ORAL | Status: DC | PRN

## 2017-10-28 MED ORDER — INSULIN ASPART 100 UNIT/ML ~~LOC~~ SOLN
0.0000 [IU] | Freq: Three times a day (TID) | SUBCUTANEOUS | Status: DC
  Filled 2017-10-28: qty 1

## 2017-10-28 SURGICAL SUPPLY — 35 items
BAG DECANTER FOR FLEXI CONT (MISCELLANEOUS) ×2 IMPLANT
BRUSH SCRUB EZ  4% CHG (MISCELLANEOUS) ×1
BRUSH SCRUB EZ 4% CHG (MISCELLANEOUS) ×1 IMPLANT
CABLE SURG 12 DISP A/V CHANNEL (MISCELLANEOUS) ×2 IMPLANT
CANISTER SUCT 1200ML W/VALVE (MISCELLANEOUS) ×2 IMPLANT
CHLORAPREP W/TINT 26ML (MISCELLANEOUS) ×2 IMPLANT
COVER LIGHT HANDLE STERIS (MISCELLANEOUS) ×4 IMPLANT
COVER MAYO STAND STRL (DRAPES) ×2 IMPLANT
DRAPE C-ARM XRAY 36X54 (DRAPES) ×2 IMPLANT
DRSG TEGADERM 4X4.75 (GAUZE/BANDAGES/DRESSINGS) ×2 IMPLANT
DRSG TELFA 4X3 1S NADH ST (GAUZE/BANDAGES/DRESSINGS) ×2 IMPLANT
ELECT REM PT RETURN 9FT ADLT (ELECTROSURGICAL) ×2
ELECTRODE REM PT RTRN 9FT ADLT (ELECTROSURGICAL) ×1 IMPLANT
GLOVE BIO SURGEON STRL SZ7.5 (GLOVE) ×6 IMPLANT
GLOVE BIO SURGEON STRL SZ8 (GLOVE) ×2 IMPLANT
GOWN STRL REUS W/ TWL LRG LVL3 (GOWN DISPOSABLE) ×1 IMPLANT
GOWN STRL REUS W/ TWL XL LVL3 (GOWN DISPOSABLE) ×1 IMPLANT
GOWN STRL REUS W/TWL LRG LVL3 (GOWN DISPOSABLE) ×1
GOWN STRL REUS W/TWL XL LVL3 (GOWN DISPOSABLE) ×1
IMMOBILIZER SHDR MD LX WHT (SOFTGOODS) IMPLANT
IMMOBILIZER SHDR XL LX WHT (SOFTGOODS) ×2 IMPLANT
INTRO PACEMKR SHEATH II 7FR (MISCELLANEOUS) ×2
INTRODUCER PACEMKR SHTH II 7FR (MISCELLANEOUS) ×1 IMPLANT
IPG PACE AZUR XT DR MRI W1DR01 (Pacemaker) ×1 IMPLANT
IV NS 500ML (IV SOLUTION) ×1
IV NS 500ML BAXH (IV SOLUTION) ×1 IMPLANT
KIT TURNOVER KIT A (KITS) ×2 IMPLANT
LABEL OR SOLS (LABEL) ×2 IMPLANT
LEAD CAPSURE NOVUS 5076-52CM (Lead) ×2 IMPLANT
LEAD CAPSURE NOVUS 5076-58CM (Lead) ×2 IMPLANT
MARKER SKIN DUAL TIP RULER LAB (MISCELLANEOUS) ×2 IMPLANT
PACE AZURE XT DR MRI W1DR01 (Pacemaker) ×2 IMPLANT
PACK PACE INSERTION (MISCELLANEOUS) ×2 IMPLANT
PAD ONESTEP ZOLL R SERIES ADT (MISCELLANEOUS) IMPLANT
SUT SILK 0 SH 30 (SUTURE) ×2 IMPLANT

## 2017-10-28 SURGICAL SUPPLY — 10 items
CABLE ADAPT CONN TEMP 6FT (ADAPTER) ×3 IMPLANT
COVER PROBE U/S 5X48 (MISCELLANEOUS) ×3 IMPLANT
DEVICE INFLAT 30 PLUS (MISCELLANEOUS) IMPLANT
KIT MANI 3VAL PERCEP (MISCELLANEOUS) ×3 IMPLANT
NEEDLE PERC 18GX7CM (NEEDLE) ×3 IMPLANT
PACK CARDIAC CATH (CUSTOM PROCEDURE TRAY) ×3 IMPLANT
SHEATH AVANTI 6FR X 11CM (SHEATH) ×3 IMPLANT
SLEEVE REPOSITIONING LENGTH 30 (MISCELLANEOUS) ×3 IMPLANT
WIRE GUIDERIGHT .035X150 (WIRE) ×3 IMPLANT
WIRE PACING TEMP ST TIP 5 (CATHETERS) ×3 IMPLANT

## 2017-10-28 NOTE — Progress Notes (Signed)
Patient resting comfortably. Syringe connected to [facility] balloon, locked, 1.3 ml air noted in syringe. Pacer rate 60,  MA at 3, sense at 2.

## 2017-10-28 NOTE — Progress Notes (Signed)
Maggie, NP notified patients BP 88/42 MAP 56 HR 25.

## 2017-10-28 NOTE — Consult Note (Signed)
[facility] Cardiology  CARDIOLOGY CONSULT NOTE  Patient ID: Darryl Jones MRN: [id number] DOB/AGE: [birth year] 62 y.o.  Admit date: 10/27/2017 Referring Physician [place] Primary Physician [facility] Primary Cardiologist  Reason for Consultation complete heart block  HPI: 62 year old gentleman referred for evaluation of complete heart block.  The patient was in his usual state of health until the morning of 10/27/2017.  Patient experienced heartburn and lightheadedness.  He presented to [facility] ED was noted to be bradycardic.  ECG revealed pleat heart block with rates in the 30s.  Admission labs were notable for negative troponin.  Patient was admitted to the CCU where he remained hemodynamically stable until approximately 11:30 PM when he developed hypotension requiring fluid bolus and dopamine drip.  Review of systems complete and found to be negative unless listed above     Past Medical History:  Diagnosis Date  . 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 Cancer ([place])    Hx; of Basal cell skin cancers of the lip and nose  . COPD (chronic obstructive pulmonary disease) ([place])   . Diabetes mellitus without complication ([place])   . Elevated liver enzymes    Hx: of "due to methotrexate"  . 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 Hyperlipidemia    Hx: of  . Osteoporosis    Hx: of  . Rosacea, acne    Hx: of  . Shortness of breath     Past Surgical History:  Procedure Laterality Date  . ABDOMINAL EXPLORATION SURGERY    . HERNIA REPAIR     umbilical  . KNEE SURGERY    . SUBMANDIBULAR GLAND EXCISION Right 01/07/2013   Dr Constance Holster  . SUBMANDIBULAR GLAND EXCISION Right 01/07/2013   Procedure: REMOVAL OF RIGHT SUBMANDIBULAR GLAND;  Surgeon: Izora Gala, MD;  Location: [facility];  Service: ENT;  Laterality: Right;  . TONSILLECTOMY      Medications Prior to Admission  Medication Sig Dispense Refill Last Dose  . Albuterol Sulfate (PROAIR RESPICLICK) 818 (90 Base) MCG/ACT AEPB Inhale 2 puffs into the  lungs every 6 (six) hours as needed (respiratory difficulties).   PRN at PRN  . atorvastatin (LIPITOR) 20 MG tablet Take 20 mg by mouth daily.   10/27/2017 at 0800  . Calcium Carbonate Antacid 1177 MG CHEW Chew 1 tablet by mouth every 4 (four) hours as needed (heartburn / indigestion).   PRN at PRN  . hydroxychloroquine (PLAQUENIL) 200 MG tablet Take 200 mg by mouth 2 (two) times daily.   10/27/2017 at 0800  . latanoprost (XALATAN) 0.005 % ophthalmic solution Place 1 drop into both eyes at bedtime.   10/26/2017 at 2000  . Tiotropium Bromide-Olodaterol (STIOLTO RESPIMAT) 2.5-2.5 MCG/ACT AERS Inhale 2 puffs into the lungs every morning.   10/27/2017 at 0800  . Tofacitinib Citrate (XELJANZ) 5 MG TABS Take 5 mg by mouth 2 (two) times daily.   10/27/2017 at 0800   Social History   Socioeconomic History  . Marital status: Married    Spouse name: Not on file  . Number of children: Not on file  . Years of education: Not on file  . Highest education level: Not on file  Occupational History  . Not on file  Social Needs  . Financial resource strain: Not on file  . Food insecurity:    Worry: Not on file    Inability: Not on file  . Transportation needs:    Medical: Not on file  Non-medical: Not on file  Tobacco Use  . Smoking status: Former Smoker    Types: Cigarettes    Last attempt to quit: 06/08/2010    Years since quitting: 7.3  . Smokeless tobacco: Never Used  . Tobacco comment: "quit smoking cigarettes in 2012"  Substance and Sexual Activity  . Alcohol use: No  . Drug use: No  . Sexual activity: Not on file  Lifestyle  . Physical activity:    Days per week: Not on file    Minutes per session: Not on file  . Stress: Not on file  Relationships  . Social connections:    Talks on phone: Not on file    Gets together: Not on file    Attends religious service: Not on file    Active member of club or organization: Not on file    Attends meetings of clubs or organizations: Not on file     Relationship status: Not on file  . Intimate partner violence:    Fear of current or ex partner: Not on file    Emotionally abused: Not on file    Physically abused: Not on file    Forced sexual activity: Not on file  Other Topics Concern  . Not on file  Social History Narrative  . Not on file    Family History  Problem Relation Age of Onset  . Diabetes Sister   . Heart disease Sister   . Kidney disease Sister   . Diabetes Brother       Review of systems complete and found to be negative unless listed above      PHYSICAL EXAM  General: Well developed, well nourished, in no acute distress HEENT:  Normocephalic and atramatic Neck:  No JVD.  Lungs: Clear bilaterally to auscultation and percussion. Heart: HRRR . Normal S1 and S2 without gallops or murmurs.  Abdomen: Bowel sounds are positive, abdomen soft and non-tender  Msk:  Back normal, normal gait. Normal strength and tone for age. Extremities: No clubbing, cyanosis or edema.   Neuro: Alert and oriented X 3. Psych:  Good affect, responds appropriately  Labs:   Lab Results  Component Value Date   WBC 7.9 10/27/2017   HGB 15.6 10/27/2017   HCT 45.5 10/27/2017   MCV 88.6 10/27/2017   PLT 196 10/27/2017    Recent Labs  Lab 10/27/17 1653  NA 141  K 5.1  CL 109  CO2 27  BUN 19  CREATININE 1.62*  CALCIUM 9.0  GLUCOSE 170*   Lab Results  Component Value Date   TROPONINI <0.03 10/27/2017   No results found for: CHOL No results found for: HDL No results found for: LDLCALC No results found for: TRIG No results found for: CHOLHDL No results found for: LDLDIRECT    Radiology: Dg Chest Portable 1 View  Result Date: 10/27/2017 CLINICAL DATA:  Chest pain and shortness of breath EXAM: PORTABLE CHEST 1 VIEW COMPARISON:  Chest radiograph May 15, 2015 and chest CT January 08, 2017 FINDINGS: There is postoperative change with scarring in the right apex region. There is also postoperative change in the right base  region. There is no edema or consolidation. Heart size and pulmonary vascularity are normal. No adenopathy. No bone lesions. No pneumothorax. IMPRESSION: Postoperative changes on the right with scarring right apex. No edema or consolidation. Stable cardiac silhouette. Electronically Signed   By: Lowella Grip III M.D.   On: 10/27/2017 17:48    EKG: Complete heart block  ASSESSMENT  AND PLAN:   1.  Complete heart block, symptomatic with hypotension  Recommendations  1.  Emergent temporary transvenous pacemaker 2.  Permanent dual-chamber pacemaker later today.  The risks, benefits and alternatives of temporary and permanent pacemaker were explained to the patient and informed consent was obtained.  Signed: Isaias Cowman MD,PhD, [facility] 10/28/2017, 12:33 AM

## 2017-10-28 NOTE — Op Note (Signed)
[facility] Cardiology   10/28/2017                     1:46 PM  PATIENT:  Darryl Jones    PRE-OPERATIVE DIAGNOSIS:  CHB  POST-OPERATIVE DIAGNOSIS:  Same  PROCEDURE:  INSERTION PACEMAKER-INITIAL INSERT DUAL CHAMBER  SURGEON:  Isaias Cowman, MD    ANESTHESIA:     PREOPERATIVE INDICATIONS:  STEPHON WEATHERS is a  62 y.o. male with a diagnosis of CHB who failed conservative measures and elected for surgical management.    The risks benefits and alternatives were discussed with the patient preoperatively including but not limited to the risks of infection, bleeding, cardiopulmonary complications, the need for revision surgery, among others, and the patient was willing to proceed.   OPERATIVE PROCEDURE: The patient was brought to the operating room in a fasting state.  The left pectoral region was prepped and draped in usual sterile manner.  Anesthesia was obtained 1% lidocaine locally.  A 6 cm incision was performed to the left pectoral region.  The pacemaker pocket was generated by electrocautery and blunt dissection.  Access was obtained to the left subclavian vein by fine-needle aspiration.  MRI compatible leads were positioned to the right ventricular apical septum and right atrial appendage under fluoroscopic guidance.  After proper thresholds were obtained the leads were sutured in place.  The leads were connected to an MRI compatible dual-chamber rate responsive pacemaker generator.  The pacemaker pocket was irrigated with gentamicin solution.  The pacemaker generator was positioned into the pocket and the pocket was closed with 2-0 and 4-0 Vicryl, respectively.  Steri-Strips and a pressure dressing were applied.  There were no periprocedural complications.  Postprocedural interrogation revealed appropriate dual-chamber atrial and ventricular pacing thresholds.

## 2017-10-28 NOTE — Anesthesia Postprocedure Evaluation (Signed)
Anesthesia Post Note  Patient: Darryl Jones  Procedure(s) Performed: INSERTION PACEMAKER-INITIAL INSERT DUAL CHAMBER (Left )  Patient location during evaluation: PACU Anesthesia Type: General Level of consciousness: awake and alert Pain management: pain level controlled Vital Signs Assessment: post-procedure vital signs reviewed and stable Respiratory status: spontaneous breathing, nonlabored ventilation and respiratory function stable Cardiovascular status: blood pressure returned to baseline and stable Postop Assessment: no apparent nausea or vomiting Anesthetic complications: no     Last Vitals:  Vitals:   10/28/17 1414 10/28/17 1428  BP: 139/84 (!) 142/69  Pulse: 60 60  Resp: 15   Temp:  36.7 C  SpO2: 100%     Last Pain:  Vitals:   10/28/17 1414  TempSrc:   PainSc: 0-No pain                 Durenda Hurt

## 2017-10-28 NOTE — Progress Notes (Signed)
Pt has arrived back from PACU post dual chamber permanent pace maker. VSS. HR is 60 AV paced. Pt on room air and resting calmly. Dressing over site is clean and intact. Wife and daughters at bedside. Will continue to monitor.

## 2017-10-28 NOTE — Progress Notes (Signed)
Patient refuses NG tube to be placed after first attempt. Provided patient with education as to why NG tube has been ordered. Patient continues to refuse.

## 2017-10-28 NOTE — Progress Notes (Signed)
Pt is c/o chest burning right side and hot flashes.

## 2017-10-28 NOTE — Progress Notes (Signed)
Patient resting comfortably. Syringe connected to [facility] balloon, locked, 1.3 ml air noted in syringe. Pacer rate 60,  MA at 3, sense at 2.

## 2017-10-28 NOTE — Progress Notes (Signed)
Patient vomited 200 ml.

## 2017-10-28 NOTE — Progress Notes (Signed)
Dr. Saralyn Pilar at bedside obtained consent for temporary pacemaker.

## 2017-10-28 NOTE — Transfer of Care (Signed)
Immediate Anesthesia Transfer of Care Note  Patient: Darryl Jones  Procedure(s) Performed: INSERTION PACEMAKER-INITIAL INSERT DUAL CHAMBER (Left )  Patient Location: PACU  Anesthesia Type:General  Level of Consciousness: awake  Airway & Oxygen Therapy: Patient Spontanous Breathing and Patient connected to nasal cannula oxygen  Post-op Assessment: Report given to RN and Post -op Vital signs reviewed and stable  Post vital signs: Reviewed  Last Vitals:  Vitals Value Taken Time  BP 119/65 10/28/2017  1:45 PM  Temp    Pulse 61 10/28/2017  1:46 PM  Resp 15 10/28/2017  1:46 PM  SpO2 100 % 10/28/2017  1:46 PM  Vitals shown include unvalidated device data.  Last Pain:  Vitals:   10/28/17 0210  TempSrc: Oral  PainSc: 0-No pain         Complications: No apparent anesthesia complications

## 2017-10-28 NOTE — Progress Notes (Signed)
   10/28/17 0005  Clinical Encounter Type  Visited With Patient and family together  Visit Type Initial   Code STEMI. Patient being prepped for transfer to Cath Lab.   Provided pastoral presence during the process and emotional support to patient's wife.  Accompanied wife to [employer], waited with her until her daughter arrived.  Made staff aware that family is in waiting area.

## 2017-10-28 NOTE — Progress Notes (Signed)
Pt being transported for temporary pacemaker.

## 2017-10-28 NOTE — Anesthesia Post-op Follow-up Note (Signed)
Anesthesia QCDR form completed.        

## 2017-10-28 NOTE — Anesthesia Preprocedure Evaluation (Signed)
Anesthesia Evaluation  Patient identified by MRN, date of birth, ID band Patient awake    Reviewed: Allergy & Precautions, H&P , NPO status , Patient's Chart, lab work & pertinent test results, reviewed documented beta blocker date and time   Airway Mallampati: III  TM Distance: >3 FB Neck ROM: full    Dental no notable dental hx. (+) Teeth Intact, Poor Dentition   Pulmonary neg pulmonary ROS, shortness of breath and lying, COPD, former smoker,    Pulmonary exam normal breath sounds clear to auscultation       Cardiovascular Exercise Tolerance: Poor hypertension, On Medications (-) angina(-) CHF negative cardio ROS  + dysrhythmias  Rhythm:regular Rate:Normal     Neuro/Psych negative neurological ROS  negative psych ROS   GI/Hepatic negative GI ROS, Neg liver ROS,   Endo/Other  negative endocrine ROSdiabetes, Well Controlled, Type 2, Oral Hypoglycemic Agents  Renal/GU      Musculoskeletal   Abdominal   Peds  Hematology negative hematology ROS (+)   Anesthesia Other Findings   Reproductive/Obstetrics negative OB ROS                             Anesthesia Physical Anesthesia Plan  ASA: II  Anesthesia Plan: MAC   Post-op Pain Management:    Induction:   PONV Risk Score and Plan:   Airway Management Planned:   Additional Equipment:   Intra-op Plan:   Post-operative Plan:   Informed Consent: I have reviewed the patients History and Physical, chart, labs and discussed the procedure including the risks, benefits and alternatives for the proposed anesthesia with the patient or authorized representative who has indicated his/her understanding and acceptance.     Plan Discussed with: CRNA  Anesthesia Plan Comments:         Anesthesia Quick Evaluation

## 2017-10-28 NOTE — Progress Notes (Signed)
Subjective: Called to the room to evaluate patient for sudden onset hypotension, left-sided chest discomfort, epigastric pain, nausea and vomiting.  Briefly patient is a 62 year old male who was admitted earlier today with a third-degree heart block.  His blood pressure has been running around 120/54 and his heart rate between 28 and 35 bpm.  He has been asymptomatic since arriving in the ICU.  He has pacer pads on and was not requiring any pressors. At about 2330, his blood pressure dropped to 88/42 with a map of 56.  Started complaining of severe epigastric pain as well as left-sided chest discomfort, hot flashes, nausea and then started vomiting profusely. A fluid bolus was initiated as well as dopamine infusion.  Dr. Saralyn Pilar was notified and a code STEMI was activated. Objective  Vital signs Blood pressure 140/64, pulse (!) 30, temperature (!) 97.3 F (36.3 C), temperature source Oral, resp. rate (!) 25, height 5\' 6"  (1.676 m), weight 90.7 kg, SpO2 97 %. Physical exam Constitutional: In moderate distress HENT: PERRLA, no JVD Cardiovascular: Apical pulse regular, bradycardic with heart rate between 30 and 34 bpm, no murmur or regurg Pulmonary/Chest: Lateral breath sounds, diminished in the bases, no wheezes or rhonchi Abdominal: Mildly distended, hypoactive bowel sounds, epigastric tenderness Neurological: Awake, follows basic commands Skin: Pale, extremities cool to touch  Assessment and plan Continue dopamine infusion Continue fluid bolus Prepare patient for temporary pacemaker per Dr. Tomasita Crumble shows We will reevaluate upon return from the [place]   Patient's wife updated at bedside Aqsa Sensabaugh S. [facility] ANP-BC Pulmonary and Critical Care Medicine [facility] Pager [phone] or [phone]  NB: This document was prepared using Dragon voice recognition software and may include unintentional dictation errors.

## 2017-10-28 NOTE — Progress Notes (Signed)
[place] at [place] NAME: Darryl Jones    MR#:  [id number]  DATE OF BIRTH:  11-15-1955  SUBJECTIVE:   Patient seen and PACU. Patient had a pacemaker placement. Denies any pain. HR 60's paced REVIEW OF SYSTEMS:   Review of Systems  Constitutional: Negative for chills, fever and weight loss.  HENT: Negative for ear discharge, ear pain and nosebleeds.   Eyes: Negative for blurred vision, pain and discharge.  Respiratory: Negative for sputum production, shortness of breath, wheezing and stridor.   Cardiovascular: Negative for chest pain, palpitations, orthopnea and PND.  Gastrointestinal: Negative for abdominal pain, diarrhea, nausea and vomiting.  Genitourinary: Negative for frequency and urgency.  Musculoskeletal: Negative for back pain and joint pain.  Neurological: Negative for sensory change, speech change, focal weakness and weakness.  Psychiatric/Behavioral: Negative for depression and hallucinations. The patient is not nervous/anxious.    DRUG ALLERGIES:   Allergies  Allergen Reactions  . Arava [Leflunomide]   . Humira [Adalimumab]   . Methotrexate Derivatives Other (See Comments)    Elevated lft's  . Aspirin Rash    VITALS:  Blood pressure (!) 142/69, pulse 60, temperature 98 F (36.7 C), resp. rate 15, height 5\' 6"  (1.676 m), weight 90.7 kg, SpO2 100 %.  PHYSICAL EXAMINATION:   Physical Exam  GENERAL:  62 y.o.-year-old patient lying in the bed with no acute distress.  EYES: Pupils equal, round, reactive to light and accommodation. No scleral icterus. Extraocular muscles intact.  HEENT: Head atraumatic, normocephalic. Oropharynx and nasopharynx clear.  NECK:  Supple, no jugular venous distention. No thyroid enlargement, no tenderness.  LUNGS: Normal breath sounds bilaterally, no wheezing, rales, rhonchi. No use of accessory muscles of respiration. PM+ dressing + CARDIOVASCULAR: S1, S2 normal. No murmurs, rubs, or  gallops.  ABDOMEN: Soft, nontender, nondistended. Bowel sounds present. No organomegaly or mass.  EXTREMITIES: No cyanosis, clubbing or edema b/l.    NEUROLOGIC: Cranial nerves II through XII are intact. No focal Motor or sensory deficits b/l.   PSYCHIATRIC:  patient is alert and oriented x 3.  SKIN: No obvious rash, lesion, or ulcer.   LABORATORY PANEL:  CBC Recent Labs  Lab 10/28/17 0531  WBC 11.0*  HGB 14.0  HCT 41.3  PLT 172    Chemistries  Recent Labs  Lab 10/27/17 1653 10/28/17 0531  NA 141 145  K 5.1 4.8  CL 109 111  CO2 27 25  GLUCOSE 170* 93  BUN 19 22  CREATININE 1.62* 1.41*  CALCIUM 9.0 8.2*  MG 2.3  --   AST  --  75*  ALT  --  107*  ALKPHOS  --  27*  BILITOT  --  0.9   Cardiac Enzymes Recent Labs  Lab 10/28/17 0822  TROPONINI <0.03   RADIOLOGY:  Dg Chest Port 1 View  Result Date: 10/28/2017 CLINICAL DATA:  Third-degree AV block.  Pacemaker insertion. EXAM: PORTABLE CHEST 1 VIEW COMPARISON:  10/27/2017 FINDINGS: Dual lead pacemaker has been inserted.  No pneumothorax. Heart size and vascularity are normal. No infiltrates or effusions. Surgical staples seen at the right lung base laterally and in the right lung apex. No bone abnormality. IMPRESSION: Satisfactory appearance of the chest after pacemaker insertion. No acute abnormalities. Electronically Signed   By: Lorriane Shire M.D.   On: 10/28/2017 15:12   Dg Chest Portable 1 View  Result Date: 10/27/2017 CLINICAL DATA:  Chest pain and shortness of breath EXAM: PORTABLE CHEST 1 VIEW  COMPARISON:  Chest radiograph May 15, 2015 and chest CT January 08, 2017 FINDINGS: There is postoperative change with scarring in the right apex region. There is also postoperative change in the right base region. There is no edema or consolidation. Heart size and pulmonary vascularity are normal. No adenopathy. No bone lesions. No pneumothorax. IMPRESSION: Postoperative changes on the right with scarring right apex. No edema  or consolidation. Stable cardiac silhouette. Electronically Signed   By: Lowella Grip III M.D.   On: 10/27/2017 17:48   Dg C-arm 1-60 Min-no Report  Result Date: 10/28/2017 Fluoroscopy was utilized by the requesting physician.  No radiographic interpretation.   ASSESSMENT AND PLAN:  Darryl Jones  is a 62 y.o. male with a known history of COPD, hyperlipidemia, diabetes mellitus, rosacea and other medical problems is presenting to the ED with a chief complaint of heartburn and lightheadedness.  Patient CBG was at 192.  EKG has revealed third-degree AV block at a rate of 29   #Near syncope from third-degree heart block -pt had temporary  -s/p dual chamber PM placement today by dr Josefa Half -HR in the 60's  #AKI Gentle hydration with IV fluids and avoid nephrotoxins and monitor renal function closely  #Chronic COPD no exacerbation at this time Continue nebulizer treatments  #Hyperlipidemia continue statin  #Diabetes mellitus Accu-Cheks and sliding scale insulin  GI and DVT prophylaxis Case discussed with Care Management/Social Worker. Management plans discussed with the patient, family and they are in agreement.  CODE STATUS: full  DVT Prophylaxis: lovenox  TOTAL TIME TAKING CARE OF THIS PATIENT: *30* minutes.  >50% time spent on counselling and coordination of care  POSSIBLE D/C IN *1-2 DAYS, DEPENDING ON CLINICAL CONDITION.  Note: This dictation was prepared with Dragon dictation along with smaller phrase technology. Any transcriptional errors that result from this process are unintentional.  Fritzi Mandes M.D on 10/28/2017 at 4:36 PM  Between 7am to 6pm - Pager - [phone]  After 6pm go to [email or web address] - password [email or web address] [place] [facility]  Office  [phone]  CC: Primary care physician; Darryl Jones, MDPatient ID: Darryl Jones, male   DOB: September 03, 1955, 62 y.o.   MRN: [id number]

## 2017-10-28 NOTE — Progress Notes (Addendum)
Syringe connected to [facility] balloon, locked, 1.3 ml air noted in syringe. Pacer rate 60,  MA at 3, sense at 2. Pt denies pain.

## 2017-10-28 NOTE — Progress Notes (Signed)
Patient resting comfortably. Syringe connected to [facility] balloon, locked, 1.3 ml air noted in syringe. Pacer rate 60,  MA at 3, sense at 2.

## 2017-10-28 NOTE — Progress Notes (Signed)
Dr. Paraschos at bedside. 

## 2017-10-28 NOTE — Progress Notes (Addendum)
Patient back from cath lab.

## 2017-10-28 NOTE — Progress Notes (Signed)
16 fr foley catheter placed per policy. 150 ml dark yellow clear urine returned upon insertion.

## 2017-10-28 NOTE — Progress Notes (Signed)
Patient resting comfortably. Syringe connected to [facility] balloon, locked, 1.3 ml air noted in syringe. Pacer rate 60,  MA at 3, sense at 2.

## 2017-10-29 LAB — HIV ANTIBODY (ROUTINE TESTING W REFLEX): HIV SCREEN 4TH GENERATION: NONREACTIVE

## 2017-10-29 LAB — GLUCOSE, CAPILLARY: GLUCOSE-CAPILLARY: 99 mg/dL (ref 70–99)

## 2017-10-29 MED ORDER — CEPHALEXIN 500 MG PO CAPS
500.0000 mg | ORAL_CAPSULE | Freq: Two times a day (BID) | ORAL | Status: DC
  Administered 2017-10-29: 500 mg via ORAL
  Filled 2017-10-29 (×2): qty 1

## 2017-10-29 MED ORDER — CEPHALEXIN 500 MG PO CAPS: 500.0000 mg | ORAL_CAPSULE | Freq: Two times a day (BID) | ORAL | 0 refills | Status: DC

## 2017-10-29 NOTE — Discharge Summary (Signed)
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 COPD (chronic obstructive pulmonary disease) ([place])   . Diabetes mellitus without complication ([place])   . Elevated liver enzymes    Hx: of "due to methotrexate"  . 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 Hyperlipidemia    Hx: of  . Osteoporosis    Hx: of  . Rosacea, acne    Hx: of  . Shortness of breath     HOSPITAL COURSE:   LonnieRuckeris a62 y.o.malewith a known history of COPD, hyperlipidemia, diabetes mellitus, rosacea and other medical problems is presenting to the ED with a chief complaint of heartburn and lightheadedness. Patient CBG was at 192. EKG has revealed third-degree AV block at a rate of 29   #Near syncope from third-degree heart block -s/p dual chamber PM placement  by dr Donnamarie Rossetti well. empiric Keflex for 7 days -HR in the 60's  #AKI Gentle hydration with IV fluids and avoid nephrotoxins and monitor renal function closely -stable  #Chronic COPD no exacerbation at this time Continue nebulizer treatments  #Hyperlipidemia continue statin  #Diabetes mellitus Accu-Cheks and sliding scale insulin  Overall doing well Will d/c pt home D/w wife--agreeable with plan Pt will f/u with Cardiology in 1 week CONSULTS OBTAINED:  Treatment Team:  Isaias Cowman, MD  DRUG ALLERGIES:   Allergies  Allergen Reactions  .  Arava [Leflunomide]   . Humira [Adalimumab]   . Methotrexate Derivatives Other (See Comments)    Elevated lft's  . Aspirin Rash    DISCHARGE MEDICATIONS:   Allergies as of 10/29/2017      Reactions   Arava [leflunomide]    Humira [adalimumab]    Methotrexate Derivatives Other (See Comments)   Elevated lft's   Aspirin Rash      Medication List    TAKE these medications   atorvastatin 20 MG tablet Commonly known as:  LIPITOR Take 20 mg by mouth daily.   Calcium Carbonate Antacid 1177 MG Chew Chew 1 tablet by mouth every 4 (four) hours as needed (heartburn / indigestion).   cephALEXin 500 MG capsule Commonly known as:  KEFLEX Take 1 capsule (500 mg total) by mouth every 12 (twelve) hours.   hydroxychloroquine 200 MG tablet Commonly known as:  PLAQUENIL Take 200 mg by mouth 2 (two) times daily.   latanoprost 0.005 % ophthalmic solution Commonly known as:  XALATAN Place 1 drop into both eyes at bedtime.   PROAIR RESPICLICK 425 (90 Base) MCG/ACT Aepb Generic drug:  Albuterol Sulfate Inhale 2 puffs into the lungs every 6 (six) hours as needed (respiratory difficulties).   STIOLTO RESPIMAT 2.5-2.5 MCG/ACT Aers Generic drug:  Tiotropium Bromide-Olodaterol Inhale 2 puffs into the lungs every morning.   XELJANZ 5 MG Tabs Generic drug:  Tofacitinib Citrate Take 5 mg by mouth 2 (two) times daily.  If you experience worsening of your admission symptoms, develop shortness of breath, life threatening emergency, suicidal or homicidal thoughts you must seek medical attention immediately by calling 911 or calling your MD immediately  if symptoms less severe.  You Must read complete instructions/literature along with all the possible adverse reactions/side effects for all the Medicines you take and that have been prescribed to you. Take any new Medicines after you have completely understood and accept all the possible adverse reactions/side effects.   Please note  You  were cared for by a hospitalist during your hospital stay. If you have any questions about your discharge medications or the care you received while you were in the hospital after you are discharged, you can call the unit and asked to speak with the hospitalist on call if the hospitalist that took care of you is not available. Once you are discharged, your primary care physician will handle any further medical issues. Please note that NO REFILLS for any discharge medications will be authorized once you are discharged, as it is imperative that you return to your primary care physician (or establish a relationship with a primary care physician if you do not have one) for your aftercare needs so that they can reassess your need for medications and monitor your lab values. Today   SUBJECTIVE   Doing well No complaints Uneventful night per RN  VITAL SIGNS:  Blood pressure 138/83, pulse (!) 59, temperature 98.4 F (36.9 C), temperature source Axillary, resp. rate 14, height 5\' 6"  (1.676 m), weight 90.7 kg, SpO2 100 %.  I/O:    Intake/Output Summary (Last 24 hours) at 10/29/2017 0807 Last data filed at 10/29/2017 0400 Gross per 24 hour  Intake 400 ml  Output 1950 ml  Net -1550 ml    PHYSICAL EXAMINATION:  GENERAL:  62 y.o.-year-old patient lying in the bed with no acute distress.  EYES: Pupils equal, round, reactive to light and accommodation. No scleral icterus. Extraocular muscles intact.  HEENT: Head atraumatic, normocephalic. Oropharynx and nasopharynx clear.  NECK:  Supple, no jugular venous distention. No thyroid enlargement, no tenderness.  LUNGS: Normal breath sounds bilaterally, no wheezing, rales,rhonchi or crepitation. No use of accessory muscles of respiration. PM site ok-dressing+ CARDIOVASCULAR: S1, S2 normal. No murmurs, rubs, or gallops.  ABDOMEN: Soft, non-tender, non-distended. Bowel sounds present. No organomegaly or mass.  EXTREMITIES: No pedal edema, cyanosis, or clubbing.   NEUROLOGIC: Cranial nerves II through XII are intact. Muscle strength 5/5 in all extremities. Sensation intact. Gait not checked.  PSYCHIATRIC: The patient is alert and oriented x 3.  SKIN: No obvious rash, lesion, or ulcer.   DATA REVIEW:   CBC  Recent Labs  Lab 10/28/17 0531  WBC 11.0*  HGB 14.0  HCT 41.3  PLT 172    Chemistries  Recent Labs  Lab 10/27/17 1653 10/28/17 0531  NA 141 145  K 5.1 4.8  CL 109 111  CO2 27 25  GLUCOSE 170* 93  BUN 19 22  CREATININE 1.62* 1.41*  CALCIUM 9.0 8.2*  MG 2.3  --   AST  --  75*  ALT  --  107*  ALKPHOS  --  27*  BILITOT  --  0.9    Microbiology Results   Recent Results (from the past 240 hour(s))  MRSA PCR Screening     Status: None   Collection Time: 10/27/17  6:38 PM  Result Value Ref Range Status   MRSA by PCR NEGATIVE NEGATIVE Final  Comment:        The GeneXpert MRSA Assay (FDA approved for NASAL specimens only), is one component of a comprehensive MRSA colonization surveillance program. It is not intended to diagnose MRSA infection nor to guide or monitor treatment for MRSA infections. Performed at [facility], [address] [place], [place] [address]     RADIOLOGY:  Dg Chest Port 1 View  Result Date: 10/28/2017 CLINICAL DATA:  Third-degree AV block.  Pacemaker insertion. EXAM: PORTABLE CHEST 1 VIEW COMPARISON:  10/27/2017 FINDINGS: Dual lead pacemaker has been inserted.  No pneumothorax. Heart size and vascularity are normal. No infiltrates or effusions. Surgical staples seen at the right lung base laterally and in the right lung apex. No bone abnormality. IMPRESSION: Satisfactory appearance of the chest after pacemaker insertion. No acute abnormalities. Electronically Signed   By: Lorriane Shire M.D.   On: 10/28/2017 15:12   Dg Chest Portable 1 View  Result Date: 10/27/2017 CLINICAL DATA:  Chest pain and shortness of breath EXAM: PORTABLE CHEST 1 VIEW COMPARISON:  Chest radiograph May 15, 2015 and chest CT January 08, 2017 FINDINGS: There is postoperative change with scarring in the right apex region. There is also postoperative change in the right base region. There is no edema or consolidation. Heart size and pulmonary vascularity are normal. No adenopathy. No bone lesions. No pneumothorax. IMPRESSION: Postoperative changes on the right with scarring right apex. No edema or consolidation. Stable cardiac silhouette. Electronically Signed   By: Lowella Grip III M.D.   On: 10/27/2017 17:48   Dg C-arm 1-60 Min-no Report  Result Date: 10/28/2017 Fluoroscopy was utilized by the requesting physician.  No radiographic interpretation.     Management plans discussed with the patient, family and they are in agreement.  CODE STATUS:     Code Status Orders  (From admission, onward)         Start     Ordered   10/27/17 1835  Full code  Continuous     10/27/17 1834        Code Status History    Date Active Date Inactive Code Status Order ID Comments User Context   01/07/2013 1213 01/09/2013 1702 Full Code [id number]  Izora Gala, MD Inpatient      TOTAL TIME TAKING CARE OF THIS PATIENT: *40* minutes.    Fritzi Mandes M.D on 10/29/2017 at 8:07 AM  Between 7am to 6pm - Pager - [phone] After 6pm go to [email or web address] - password [email or web address] [place]  Office  [phone]  CC: Primary care physician; Lavone Orn, MD

## 2017-10-29 NOTE — Progress Notes (Signed)
Pt being discharged home today. Pt accompanied by his wife. 2 PIVs and foley catheter removed. AVS completed and all education and instructions provided to patient and wife by Care RN. Pt given scheduled meds prior to discharge. Pt transferred off floor and downstairs for discharge by volunteer services. Pts Wife has all belongings.

## 2017-11-05 DIAGNOSIS — Z950 Presence of cardiac pacemaker: Secondary | ICD-10-CM | POA: Insufficient documentation

## 2017-11-05 DIAGNOSIS — I10 Essential (primary) hypertension: Secondary | ICD-10-CM | POA: Insufficient documentation

## 2017-11-05 DIAGNOSIS — E785 Hyperlipidemia, unspecified: Secondary | ICD-10-CM | POA: Insufficient documentation

## 2017-11-22 DIAGNOSIS — R002 Palpitations: Secondary | ICD-10-CM | POA: Insufficient documentation

## 2017-12-02 DIAGNOSIS — I4891 Unspecified atrial fibrillation: Secondary | ICD-10-CM | POA: Insufficient documentation

## 2017-12-22 ENCOUNTER — Emergency Department: Payer: Medicare Other

## 2017-12-22 ENCOUNTER — Encounter: Payer: Self-pay | Admitting: Emergency Medicine

## 2017-12-22 ENCOUNTER — Emergency Department
Admission: EM | Admit: 2017-12-22 | Discharge: 2017-12-22 | Disposition: A | Discharge status: Home / Self Care | Payer: Medicare Other | Attending: Emergency Medicine | Admitting: Emergency Medicine

## 2017-12-22 DIAGNOSIS — Z85828 Personal history of other malignant neoplasm of skin: Secondary | ICD-10-CM | POA: Diagnosis not present

## 2017-12-22 DIAGNOSIS — R42 Dizziness and giddiness: Secondary | ICD-10-CM | POA: Insufficient documentation

## 2017-12-22 DIAGNOSIS — J449 Chronic obstructive pulmonary disease, unspecified: Secondary | ICD-10-CM | POA: Diagnosis not present

## 2017-12-22 DIAGNOSIS — R0602 Shortness of breath: Secondary | ICD-10-CM | POA: Diagnosis not present

## 2017-12-22 DIAGNOSIS — E119 Type 2 diabetes mellitus without complications: Secondary | ICD-10-CM | POA: Diagnosis not present

## 2017-12-22 DIAGNOSIS — Z87891 Personal history of nicotine dependence: Secondary | ICD-10-CM | POA: Insufficient documentation

## 2017-12-22 DIAGNOSIS — Z79899 Other long term (current) drug therapy: Secondary | ICD-10-CM | POA: Diagnosis not present

## 2017-12-22 DIAGNOSIS — Z950 Presence of cardiac pacemaker: Secondary | ICD-10-CM | POA: Diagnosis not present

## 2017-12-22 LAB — CBC
HCT: 45 % (ref 39.0–52.0)
Hemoglobin: 14.8 g/dL (ref 13.0–17.0)
MCH: 29 pg (ref 26.0–34.0)
MCHC: 32.9 g/dL (ref 30.0–36.0)
MCV: 88.1 fL (ref 80.0–100.0)
PLATELETS: 217 10*3/uL (ref 150–400)
RBC: 5.11 MIL/uL (ref 4.22–5.81)
RDW: 13.7 % (ref 11.5–15.5)
WBC: 7 10*3/uL (ref 4.0–10.5)
nRBC: 0 % (ref 0.0–0.2)

## 2017-12-22 LAB — BASIC METABOLIC PANEL
Anion gap: 7 (ref 5–15)
BUN: 11 mg/dL (ref 8–23)
CALCIUM: 8.8 mg/dL — AB (ref 8.9–10.3)
CO2: 29 mmol/L (ref 22–32)
CREATININE: 1.1 mg/dL (ref 0.61–1.24)
Chloride: 107 mmol/L (ref 98–111)
GFR calc Af Amer: >60 mL/min (ref >60)
GLUCOSE: 110 mg/dL — AB (ref 70–99)
Potassium: 4.2 mmol/L (ref 3.5–5.1)
Sodium: 143 mmol/L (ref 135–145)

## 2017-12-22 LAB — GLUCOSE, CAPILLARY: Glucose-Capillary: 99 mg/dL (ref 70–99)

## 2017-12-22 LAB — URINALYSIS, COMPLETE (UACMP) WITH MICROSCOPIC
Bacteria, UA: NONE SEEN
Bilirubin Urine: NEGATIVE
GLUCOSE, UA: NEGATIVE mg/dL
Hgb urine dipstick: NEGATIVE
Ketones, ur: NEGATIVE mg/dL
Leukocytes, UA: NEGATIVE
Nitrite: NEGATIVE
PH: 5 (ref 5.0–8.0)
PROTEIN: NEGATIVE mg/dL
Specific Gravity, Urine: 1.013 (ref 1.005–1.030)
Squamous Epithelial / LPF: NONE SEEN (ref 0–5)

## 2017-12-22 LAB — TROPONIN I

## 2017-12-22 NOTE — ED Provider Notes (Signed)
[facility] Emergency Department Provider Note   ____________________________________________    I have reviewed the triage vital signs and the nursing notes.   HISTORY  Chief Complaint Dizziness    HPI Darryl Jones is a 62 y.o. male who presents with complaints of dizziness.  Patient reports a history of diabetes, had a pacemaker inserted 2 months ago.  He notes that he and his wife have been downsizing into a smaller home and he helped his wife lift something that was heavy today and he is concerned that the pacer wires may have shifted.  He felt mildly lightheaded.  Denies any chest pain.  Sometimes has shortness of breath with exertion this is chronic.  Currently feels quite well and has no dizziness.  No nausea or vomiting or diaphoresis.  Past Medical History:  Diagnosis Date  . 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 COPD (chronic obstructive pulmonary disease) ([place])   . Diabetes mellitus without complication ([place])   . Elevated liver enzymes    Hx: of "due to methotrexate"  . 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 Hyperlipidemia    Hx: of  . Osteoporosis    Hx: of  . Rosacea, acne    Hx: of  . Shortness of breath     Patient Active Problem List   Diagnosis Date Noted  . Cardiogenic shock ([place])   . Third degree AV block ([place]) 10/27/2017    Past Surgical History:  Procedure Laterality Date  . ABDOMINAL EXPLORATION SURGERY    . HERNIA REPAIR     umbilical  . KNEE SURGERY    . PACEMAKER INSERTION Left 10/28/2017   Procedure: INSERTION PACEMAKER-INITIAL INSERT DUAL CHAMBER;  Surgeon: Isaias Cowman, MD;  Location: [facility] ORS;  Service: Cardiovascular;  Laterality: Left;  . SUBMANDIBULAR GLAND EXCISION Right 01/07/2013   Dr Constance Holster  . SUBMANDIBULAR GLAND EXCISION Right 01/07/2013   Procedure: REMOVAL OF RIGHT SUBMANDIBULAR GLAND;  Surgeon: Izora Gala,  MD;  Location: [facility];  Service: ENT;  Laterality: Right;  . TONSILLECTOMY      Prior to Admission medications   Medication Sig Start Date End Date Taking? Authorizing Provider  Albuterol Sulfate (PROAIR RESPICLICK) 258 (90 Base) MCG/ACT AEPB Inhale 2 puffs into the lungs every 6 (six) hours as needed (respiratory difficulties).    [provider]  atorvastatin (LIPITOR) 20 MG tablet Take 20 mg by mouth daily.    [provider]  Calcium Carbonate Antacid 1177 MG CHEW Chew 1 tablet by mouth every 4 (four) hours as needed (heartburn / indigestion).    [provider]  cephALEXin (KEFLEX) 500 MG capsule Take 1 capsule (500 mg total) by mouth every 12 (twelve) hours. 10/29/17   Fritzi Mandes, MD  hydroxychloroquine (PLAQUENIL) 200 MG tablet Take 200 mg by mouth 2 (two) times daily.    [provider]  latanoprost (XALATAN) 0.005 % ophthalmic solution Place 1 drop into both eyes at bedtime.    [provider]  Tiotropium Bromide-Olodaterol (STIOLTO RESPIMAT) 2.5-2.5 MCG/ACT AERS Inhale 2 puffs into the lungs every morning.    [provider]  Tofacitinib Citrate (XELJANZ) 5 MG TABS Take 5 mg by mouth 2 (two) times daily.    [provider]     Allergies Arava [leflunomide]; Humira [adalimumab]; Methotrexate derivatives; and Aspirin  Family History  Problem Relation Age of Onset  . Diabetes Sister   . Heart disease Sister   . Kidney disease Sister   . Diabetes Brother     Social History Social History   Tobacco Use  . Smoking status: Former Smoker    Types: Cigarettes    Last attempt to quit: 06/08/2010    Years since quitting: 7.5  . Smokeless tobacco: Never Used  . Tobacco comment: "quit smoking cigarettes in 2012"  Substance Use Topics  . Alcohol use: No  . Drug use: No    Review of Systems  Constitutional: As above Eyes: No visual changes.  ENT: No sore throat. Cardiovascular: As above Respiratory: Denies  shortness of breath. Gastrointestinal: No abdominal pain.  No nausea, no vomiting.   Genitourinary: Negative for frequency Musculoskeletal: Negative for back pain. Skin: Negative for rash. Neurological: Negative for headaches    ____________________________________________   PHYSICAL EXAM:  VITAL SIGNS: ED Triage Vitals [12/22/17 1525]  Enc Vitals Group     BP 132/70     Pulse Rate 70     Resp 18     Temp 97.9 F (36.6 C)     Temp Source Oral     SpO2 100 %     Weight 88.5 kg (195 lb)     Height 1.676 m (5\' 6" )     Head Circumference      Peak Flow      Pain Score 0     Pain Loc      Pain Edu?      Excl. in [place]?     Constitutional: Alert and oriented. No acute distress. Pleasant and interactive Eyes: Conjunctivae are normal.   Nose: No congestion/rhinnorhea. Mouth/Throat: Mucous membranes are moist.    Cardiovascular: Normal rate, regular rhythm. Grossly normal heart sounds.  Good peripheral circulation.  Pacemaker pocket well-healed, nontender Respiratory: Normal respiratory effort.  No retractions. Lungs CTAB. Gastrointestinal: Soft and nontender. No distention.    Musculoskeletal: No lower extremity tenderness nor edema.  Warm and well perfused Neurologic:  Normal speech and language. No gross focal neurologic deficits are appreciated.  Skin:  Skin is warm, dry and intact. No rash noted. Psychiatric: Mood and affect are normal. Speech and behavior are normal.  ____________________________________________   LABS (all labs ordered are listed, but only abnormal results are displayed)  Labs Reviewed  BASIC METABOLIC PANEL - Abnormal; Notable for the following components:      Result Value   Glucose, Bld 110 (*)    Calcium 8.8 (*)    All other components within normal limits  URINALYSIS, COMPLETE (UACMP) WITH MICROSCOPIC - Abnormal; Notable for the following components:   Color, Urine YELLOW (*)    APPearance CLEAR (*)    All other components within normal  limits  CBC  GLUCOSE, CAPILLARY  TROPONIN I   ____________________________________________  EKG  ED ECG REPORT I, Lavonia Drafts, the attending physician, personally viewed and interpreted this ECG.  Date: 12/22/2017  Rhythm: Atrial paced rhythm QRS Axis: Abnormal Intervals: Abnormal ST/T Wave abnormalities: normal   ____________________________________________  RADIOLOGY  Chest x-ray unremarkable ____________________________________________   PROCEDURES  Procedure(s) performed: No  Procedures   Critical Care performed: No ____________________________________________   INITIAL IMPRESSION / ASSESSMENT AND PLAN / ED COURSE  Pertinent labs & imaging results that were available during my care of the patient were reviewed by me and considered in my medical decision making (see chart for details).  Patient well-appearing in no  acute distress.  Lab work is quite reassuring, EKG is unremarkable.  Vitals are normal.  Orthostatics performed by me were normal.  He is asymptomatic in the emergency department.  He is reassured by x-ray demonstrating wires in  appropriate position.  Appropriate for discharge at this time with follow-up with cardiology as needed.    ____________________________________________   FINAL CLINICAL IMPRESSION(S) / ED DIAGNOSES  Final diagnoses:  Dizziness        Note:  This document was prepared using Dragon voice recognition software and may include unintentional dictation errors.    Lavonia Drafts, MD 12/22/17 [phone]

## 2017-12-22 NOTE — ED Notes (Signed)
RN updated on new patient. Pt placed on cardiac monitor. NAD at this time. Family at bedside.

## 2017-12-22 NOTE — ED Notes (Signed)
Per Dr. Archie Balboa, no need to interrogate pacemaker at this time.

## 2017-12-22 NOTE — ED Triage Notes (Signed)
Patient presents to the ED with dizziness and shortness of breath today.  Patient states he had a pacemaker placed in August and has had some soreness to the site and states he felt yesterday pacemaker had "shifted to the left".  Patient denies passing out.  Denies chest pain.   Alert and oriented x 4.

## 2018-01-01 DIAGNOSIS — R1314 Dysphagia, pharyngoesophageal phase: Secondary | ICD-10-CM | POA: Insufficient documentation

## 2018-01-03 ENCOUNTER — Emergency Department: Payer: Medicare Other

## 2018-01-03 ENCOUNTER — Other Ambulatory Visit: Payer: Self-pay | Admitting: Physician Assistant

## 2018-01-03 ENCOUNTER — Other Ambulatory Visit: Payer: Self-pay

## 2018-01-03 ENCOUNTER — Emergency Department
Admission: EM | Admit: 2018-01-03 | Discharge: 2018-01-03 | Disposition: A | Discharge status: Home / Self Care | Payer: Medicare Other | Attending: Emergency Medicine | Admitting: Emergency Medicine

## 2018-01-03 DIAGNOSIS — R0609 Other forms of dyspnea: Secondary | ICD-10-CM | POA: Diagnosis present

## 2018-01-03 DIAGNOSIS — Z79899 Other long term (current) drug therapy: Secondary | ICD-10-CM | POA: Insufficient documentation

## 2018-01-03 DIAGNOSIS — F458 Other somatoform disorders: Secondary | ICD-10-CM | POA: Insufficient documentation

## 2018-01-03 DIAGNOSIS — E119 Type 2 diabetes mellitus without complications: Secondary | ICD-10-CM | POA: Diagnosis not present

## 2018-01-03 DIAGNOSIS — Z87891 Personal history of nicotine dependence: Secondary | ICD-10-CM | POA: Insufficient documentation

## 2018-01-03 DIAGNOSIS — R0989 Other specified symptoms and signs involving the circulatory and respiratory systems: Secondary | ICD-10-CM

## 2018-01-03 DIAGNOSIS — R1314 Dysphagia, pharyngoesophageal phase: Secondary | ICD-10-CM

## 2018-01-03 DIAGNOSIS — J449 Chronic obstructive pulmonary disease, unspecified: Secondary | ICD-10-CM | POA: Diagnosis not present

## 2018-01-03 MED ORDER — PREDNISONE 20 MG PO TABS
40.0000 mg | ORAL_TABLET | Freq: Once | ORAL | Status: AC
  Administered 2018-01-03: 40 mg via ORAL
  Filled 2018-01-03: qty 2

## 2018-01-03 MED ORDER — PREDNISONE 20 MG PO TABS: 40.0000 mg | ORAL_TABLET | Freq: Every day | ORAL | 0 refills | Status: DC

## 2018-01-03 NOTE — Discharge Instructions (Addendum)
Follow-up with your GI physician today at 315p as planned.   Return to the Emergency Department (ED) if you experience any worsening or new symptoms that concern you such as trouble breathing, vomiting, inability to swallow, feel your symptoms are worsening or other concerns arise.

## 2018-01-03 NOTE — ED Triage Notes (Signed)
Pt reports that a few days ago while eating wendys chili he felt it hanging in his throat, pt was seen at ENT yesterday and had a light ran down and didn't see anything and prescribed reflux medications, pt states the difficulty with breathing started initially but is worse now, pt has a hoarse sound to his voice and wife states that it sounds worse and deeper today

## 2018-01-03 NOTE — ED Notes (Signed)
ED Provider at bedside. 

## 2018-01-03 NOTE — ED Provider Notes (Signed)
[facility] Emergency Department Provider Note  ____________________________________________   First MD Initiated Contact with Patient 01/03/18 1157     (approximate)  I have reviewed the triage vital signs and the nursing notes.   HISTORY  Chief Complaint Respiratory Distress  HPI Darryl Jones is a 62 y.o. male who presents for evaluation of a feeling like something is stuck in the back of his throat  Patient reports this started about 2 days ago when he was eating chili, and had chunks of meat and he felt as though something was stuck on his vocal cords.  His wife reports that he saw ear nose and throat and records confirm same and there was no evidence of a foreign body at that time, he was recommended to start on Protonix and also consider follow-up with GI  Today after eating chicken soup with out chunks of meat in it, he reports he again developed a feeling like something stuck and he points right towards around the area of his hyoid bone.  Feels like his voice is just a little bit hoarse.  Denies any trouble breathing however.  Continues to feel a feeling like something stuck in the throat.  No nausea vomiting.  Reports also when he swallows food   Past Medical History:  Diagnosis Date  . 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 Cancer ([place])    Hx; of Basal cell skin cancers of the lip and nose  . COPD (chronic obstructive pulmonary disease) ([place])   . Diabetes mellitus without complication ([place])   . Elevated liver enzymes    Hx: of "due to methotrexate"  . 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 Hyperlipidemia    Hx: of  . Osteoporosis    Hx: of  . Rosacea, acne    Hx: of  . Shortness of breath     Patient Active Problem List   Diagnosis Date Noted  . Cardiogenic shock ([place])   . Third degree AV block ([place]) 10/27/2017    Past Surgical History:  Procedure Laterality Date  . ABDOMINAL EXPLORATION SURGERY    . HERNIA REPAIR     umbilical  . KNEE SURGERY    . PACEMAKER INSERTION Left 10/28/2017   Procedure: INSERTION PACEMAKER-INITIAL INSERT DUAL CHAMBER;  Surgeon: Isaias Cowman, MD;  Location: [facility] ORS;  Service: Cardiovascular;  Laterality: Left;  . SUBMANDIBULAR GLAND EXCISION Right 01/07/2013   Dr Constance Holster  . SUBMANDIBULAR GLAND EXCISION Right 01/07/2013   Procedure: REMOVAL OF RIGHT SUBMANDIBULAR GLAND;  Surgeon: Izora Gala, MD;  Location: [facility];  Service: ENT;  Laterality: Right;  . TONSILLECTOMY      Prior to Admission medications   Medication Sig Start Date End Date Taking? Authorizing Provider  Albuterol Sulfate (PROAIR RESPICLICK) 510 (90 Base) MCG/ACT AEPB Inhale 2 puffs into the lungs every 6 (six) hours as needed (respiratory difficulties).    [provider]  atorvastatin (LIPITOR) 20 MG tablet Take 20 mg by mouth daily.    [provider]  Calcium Carbonate Antacid 1177 MG CHEW Chew 1 tablet by mouth every 4 (four) hours as needed (heartburn / indigestion).    [provider]  cephALEXin (KEFLEX) 500 MG capsule Take 1 capsule (500 mg total) by mouth every 12 (twelve) hours. 10/29/17   Fritzi Mandes, MD  hydroxychloroquine (PLAQUENIL) 200 MG tablet Take 200 mg by mouth 2 (two) times daily.    [provider]  latanoprost (XALATAN) 0.005 % ophthalmic solution Place 1 drop into both eyes at bedtime.    [provider]  predniSONE (DELTASONE) 20 MG tablet Take 2 tablets (40 mg total) by mouth daily. 01/03/18   Delman Kitten, MD  Tiotropium Bromide-Olodaterol (STIOLTO RESPIMAT) 2.5-2.5 MCG/ACT AERS Inhale 2 puffs into the lungs every morning.    [provider]  Tofacitinib Citrate (XELJANZ) 5 MG TABS Take 5 mg by mouth 2 (two) times daily.    [provider]    Allergies Arava [leflunomide]; Humira [adalimumab]; Methotrexate derivatives; and Aspirin  Family History  Problem Relation Age of Onset  . Diabetes Sister   . Heart disease Sister    . Kidney disease Sister   . Diabetes Brother     Social History Social History   Tobacco Use  . Smoking status: Former Smoker    Types: Cigarettes    Last attempt to quit: 06/08/2010    Years since quitting: 7.5  . Smokeless tobacco: Never Used  . Tobacco comment: "quit smoking cigarettes in 2012"  Substance Use Topics  . Alcohol use: No  . Drug use: No    Review of Systems Constitutional: No fever/chills Eyes: No visual changes. ENT: No sore throat reports a slightly stuck sensation in the back of the throat, it came about 2 days ago and seems to have come back today after swallowing soup.  Not spitting up anything and he reports he is able to swallow water well but if he swallows any solid he just has a sort of stuck sensation.  No abdominal pain and does not feel like anything stuck in his chest or behind the breastbone Cardiovascular: Denies chest pain. Respiratory: Denies shortness of breath. Gastrointestinal: No abdominal pain.   Genitourinary: Negative for dysuria. Musculoskeletal: Negative for back pain. Skin: Negative for rash. Neurological: Negative for headaches, areas of focal weakness or numbness.   Wife reports he has an appointment to see GI today for same at 3 PM. ____________________________________________   PHYSICAL EXAM:  VITAL SIGNS: ED Triage Vitals  Enc Vitals Group     BP 01/03/18 1138 (!) 157/75     Pulse Rate 01/03/18 1138 72     Resp 01/03/18 1138 18     Temp 01/03/18 1138 97.6 F (36.4 C)     Temp Source 01/03/18 1138 Oral     SpO2 01/03/18 1138 100 %     Weight 01/03/18 1139 180 lb (81.6 kg)     Height 01/03/18 1139 5\' 6"  (1.676 m)     Head Circumference --      Peak Flow --      Pain Score 01/03/18 1139 9     Pain Loc --      Pain Edu? --      Excl. in [place]? --     Constitutional: Alert and oriented. Well appearing and in no acute distress. Eyes: Conjunctivae are normal. Head: Atraumatic. Nose: No  congestion/rhinnorhea. Mouth/Throat: Mucous membranes are moist.  Oropharynx is widely patent.  There is no swelling tenderness or discomfort to palpation of the anterior neck. Neck: No stridor.  Cardiovascular: Normal rate, regular rhythm. Grossly normal heart sounds.  Good peripheral circulation. Respiratory: Normal respiratory effort.  No retractions. Lungs CTAB. Gastrointestinal: Soft and nontender. No distention. Musculoskeletal: No lower extremity tenderness nor edema. Neurologic:  Normal speech and language. No gross focal neurologic deficits are appreciated.  Skin:  Skin is warm, dry and intact. No rash noted. Psychiatric: Mood and affect are  normal. Speech and behavior are normal.  ____________________________________________   LABS (all labs ordered are listed, but only abnormal results are displayed)  Labs Reviewed - No data to display ____________________________________________  EKG   ____________________________________________  RADIOLOGY  Ct Soft Tissue Neck Wo Contrast  Result Date: 01/03/2018 CLINICAL DATA:  Globus sensation.  Foreign body in mental. EXAM: CT NECK WITHOUT CONTRAST TECHNIQUE: Multidetector CT imaging of the neck was performed following the standard protocol without intravenous contrast. COMPARISON:  Neck MRI 11/30/2012 FINDINGS: Pharynx and larynx: Submucosal low-density with thickening at the bilateral aryepiglottic fold. Normal epiglottis. Patent airway. No evidence of foreign body. Salivary glands: Resected right submandibular gland. The remaining glands are unremarkable Thyroid: Negative Lymph nodes: None enlarged or abnormal density Vascular: Atherosclerotic calcification. Limited intracranial: Negative Visualized orbits: Negative Mastoids and visualized paranasal sinuses: Clear Skeleton: No acute or aggressive finding Upper chest: Centrilobular emphysema. Lung sutures at the right apex. IMPRESSION: Mild edematous thickening of the aryepiglottic  folds. No evidence of foreign body. Electronically Signed   By: Monte Fantasia M.D.   On: 01/03/2018 13:02    ____________________________________________   PROCEDURES  Procedure(s) performed: None  Procedures  Critical Care performed: No  ____________________________________________   INITIAL IMPRESSION / ASSESSMENT AND PLAN / ED COURSE  Pertinent labs & imaging results that were available during my care of the patient were reviewed by me and considered in my medical decision making (see chart for details).   ----------------------------------------- 12:28 PM on 01/03/2018 -----------------------------------------      CT findings discussed with Dr. Richardson Landry including some slight area epiglottic edema. Dr. Richardson Landry advises follow-up with GI today at 315p (as planned) is reasonable at this time, can follow-up with ENT but more likely advises a GI evaluation today is reasonable as saw ENT just 2 days ago, but patient is welcome to follow-up with him too.   Patient not on an ACE or ARB.  Reports he is feeling better, is not having as much of a stuck sensation as it was earlier today it seems to be going down the anticipates he will come back when he tries to eat something again.  He has an appointment with GI for further evaluation at 315 today, will place the patient on prednisone for the next couple of days as ENT advised that this may help with some of the sensation and slight amount of edema noted on CT  Appears stable for discharge with plan follow-up at 3:15 PM today with GI.  Has seen ENT 2 days ago for same, wife reports he was told there is slight amount of inflammation in the area but they also recommended a GI follow-up and he is attending today.  He is able to speak in clear sentences, no stridor, no respiratory distress.  Return precautions and treatment recommendations and follow-up discussed with the patient who is agreeable with the  plan.   ____________________________________________   FINAL CLINICAL IMPRESSION(S) / ED DIAGNOSES  Final diagnoses:  Globus sensation        Note:  This document was prepared using Dragon voice recognition software and may include unintentional dictation errors       Delman Kitten, MD 01/03/18 1349

## 2018-01-06 ENCOUNTER — Ambulatory Visit
Admission: RE | Admit: 2018-01-06 | Discharge: 2018-01-06 | Disposition: A | Discharge status: Home / Self Care | Payer: Medicare Other | Source: Ambulatory Visit | Attending: Physician Assistant | Admitting: Physician Assistant

## 2018-01-06 DIAGNOSIS — R0989 Other specified symptoms and signs involving the circulatory and respiratory systems: Secondary | ICD-10-CM

## 2018-01-06 DIAGNOSIS — R1314 Dysphagia, pharyngoesophageal phase: Secondary | ICD-10-CM

## 2018-04-29 DIAGNOSIS — Z01810 Encounter for preprocedural cardiovascular examination: Secondary | ICD-10-CM | POA: Insufficient documentation

## 2018-07-03 ENCOUNTER — Ambulatory Visit: Payer: Medicare Other | Admitting: Podiatry

## 2018-07-03 ENCOUNTER — Other Ambulatory Visit: Payer: Self-pay

## 2018-07-03 ENCOUNTER — Ambulatory Visit (INDEPENDENT_AMBULATORY_CARE_PROVIDER_SITE_OTHER): Payer: Medicare Other

## 2018-07-03 ENCOUNTER — Encounter: Payer: Self-pay | Admitting: Podiatry

## 2018-07-03 VITALS — Temp 96.6°F

## 2018-07-03 DIAGNOSIS — M79671 Pain in right foot: Secondary | ICD-10-CM | POA: Diagnosis not present

## 2018-07-03 DIAGNOSIS — M79676 Pain in unspecified toe(s): Secondary | ICD-10-CM | POA: Diagnosis not present

## 2018-07-03 DIAGNOSIS — E1142 Type 2 diabetes mellitus with diabetic polyneuropathy: Secondary | ICD-10-CM

## 2018-07-03 DIAGNOSIS — M79672 Pain in left foot: Secondary | ICD-10-CM | POA: Diagnosis not present

## 2018-07-03 DIAGNOSIS — B351 Tinea unguium: Secondary | ICD-10-CM

## 2018-07-03 DIAGNOSIS — Q828 Other specified congenital malformations of skin: Secondary | ICD-10-CM

## 2018-07-03 NOTE — Progress Notes (Signed)
Subjective:  Patient ID: Darryl Jones, male    DOB: 11-Apr-1955,  MRN: [id number] HPI Chief Complaint  Patient presents with  . Diabetes    Patient presents with his wife and she states - thickened toenails, hallux left had drainage under the nail last week, just completed an antibiotic for that, better now, callused areas plantar forefoot and medial hallux left   . Diabetes    Last a1c was 6.2 - diet controlled    63 y.o. male presents with the above complaint.   ROS: Denies fever chills nausea vomiting muscle aches pains calf pain back pain chest pain shortness of breath.  Past Medical History:  Diagnosis Date  . 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 Cancer ([place])    Hx; of Basal cell skin cancers of the lip and nose  . COPD (chronic obstructive pulmonary disease) ([place])   . Diabetes mellitus without complication ([place])   . Elevated liver enzymes    Hx: of "due to methotrexate"  . 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 Hyperlipidemia    Hx: of  . Osteoporosis    Hx: of  . Rosacea, acne    Hx: of  . Shortness of breath    Past Surgical History:  Procedure Laterality Date  . ABDOMINAL EXPLORATION SURGERY    . HERNIA REPAIR     umbilical  . KNEE SURGERY    . PACEMAKER INSERTION Left 10/28/2017   Procedure: INSERTION PACEMAKER-INITIAL INSERT DUAL CHAMBER;  Surgeon: Isaias Cowman, MD;  Location: [facility] ORS;  Service: Cardiovascular;  Laterality: Left;  . SUBMANDIBULAR GLAND EXCISION Right 01/07/2013   Dr Constance Holster  . SUBMANDIBULAR GLAND EXCISION Right 01/07/2013   Procedure: REMOVAL OF RIGHT SUBMANDIBULAR GLAND;  Surgeon: Izora Gala, MD;  Location: [facility];  Service: ENT;  Laterality: Right;  . TONSILLECTOMY      Current Outpatient Medications:  .  acetaminophen (TYLENOL) 500 MG tablet, Take by mouth., Disp: , Rfl:  .  Albuterol Sulfate (PROAIR RESPICLICK) 443 (90 Base) MCG/ACT AEPB, Inhale 2 puffs into the lungs every 6 (six) hours as needed (respiratory  difficulties)., Disp: , Rfl:  .  atorvastatin (LIPITOR) 20 MG tablet, Take 20 mg by mouth daily., Disp: , Rfl:  .  latanoprost (XALATAN) 0.005 % ophthalmic solution, Place 1 drop into both eyes at bedtime., Disp: , Rfl:  .  Tofacitinib Citrate (XELJANZ) 5 MG TABS, Take 5 mg by mouth 2 (two) times daily., Disp: , Rfl:   Allergies  Allergen Reactions  . Claritin [Loratadine] Anaphylaxis  . Arava [Leflunomide]   . Humira [Adalimumab]   . Methotrexate Derivatives Other (See Comments)    Elevated lft's  . Aspirin Rash   Review of Systems Objective:   Vitals:   07/03/18 1030  Temp: (!) 96.6 F (35.9 C)    General: Well developed, nourished, in no acute distress, alert and oriented x3   Dermatological: Skin is warm, dry and supple bilateral. Nails x 10 are well maintained; remaining integument appears unremarkable at this time. There are no open sores,  preulcerative lesions, no rash or signs of infection present.  Toenails are thick yellow dystrophic and elongated.  Do not appear to be clinically mycotic.  Pre-ulcerative lesion plantar aspect of the fourth metatarsal head of the left foot once debrided today does not probe deep to bone.  Vascular: Dorsalis Pedis artery and Posterior Tibial artery pedal pulses are 2/4 bilateral with immedate capillary fill  time. Pedal hair growth present. No varicosities and no lower extremity edema present bilateral.   Neruologic: Grossly intact via light touch bilateral. Vibratory intact via tuning fork bilateral. Protective threshold with Semmes Wienstein monofilament intact to all pedal sites bilateral. Patellar and Achilles deep tendon reflexes 2+ bilateral. No Babinski or clonus noted bilateral.   Musculoskeletal: No gross boney pedal deformities bilateral. No pain, crepitus, or limitation noted with foot and ankle range of motion bilateral. Muscular strength 5/5 in all groups tested bilateral.  Gait: Unassisted, Nonantalgic.    Radiographs:   None taken  Assessment & Plan:   Assessment: Painful elongated nails history of rheumatoid arthritis diabetes mellitus cardiac disease.  History of pre-ulcerative lesion plantar aspect left foot.  Plan: Discussed etiology pathology conservative versus surgical therapies.  At this point we debrided his reactive hyperkeratotic lesion pre-ulcerative lesions no open wounds no bleeding no purulence no malodor or signs of infection.  At this point we also debrided toenails 1 through 5 bilateral.      T. [place], [place]

## 2018-07-09 ENCOUNTER — Other Ambulatory Visit: Payer: Self-pay

## 2018-07-09 ENCOUNTER — Ambulatory Visit: Payer: Medicare Other | Admitting: Orthotics

## 2018-07-09 DIAGNOSIS — E1142 Type 2 diabetes mellitus with diabetic polyneuropathy: Secondary | ICD-10-CM

## 2018-07-09 DIAGNOSIS — Q828 Other specified congenital malformations of skin: Secondary | ICD-10-CM

## 2018-07-09 NOTE — Progress Notes (Signed)

## 2018-07-10 ENCOUNTER — Telehealth: Payer: Self-pay | Admitting: Podiatry

## 2018-07-10 NOTE — Telephone Encounter (Signed)
pts wife called yesterday and said pt seen Rick in [place] and ordered shoes but when [employer] him he told pt he was getting a size 9 but wife states he wears a size 10 due to pt has swelling and nodules that cause pain.

## 2018-10-02 ENCOUNTER — Ambulatory Visit: Payer: Medicare Other | Admitting: Podiatry

## 2018-10-02 ENCOUNTER — Other Ambulatory Visit: Payer: Self-pay | Admitting: Internal Medicine

## 2018-10-02 DIAGNOSIS — Z20822 Contact with and (suspected) exposure to covid-19: Secondary | ICD-10-CM

## 2018-10-05 LAB — NOVEL CORONAVIRUS, NAA: SARS-CoV-2, NAA: DETECTED — AB

## 2018-10-06 ENCOUNTER — Telehealth: Payer: Self-pay | Admitting: *Deleted

## 2018-10-06 NOTE — Telephone Encounter (Signed)
Outbound call to PCP. Spoke with Hassan Rowan regarding patient's positive [phone] results and that the provider will need to notify the patient of the results. I have contacted [facility].

## 2018-10-10 ENCOUNTER — Encounter: Payer: Self-pay | Admitting: Emergency Medicine

## 2018-10-10 ENCOUNTER — Other Ambulatory Visit: Payer: Self-pay

## 2018-10-10 ENCOUNTER — Emergency Department
Admission: EM | Admit: 2018-10-10 | Discharge: 2018-10-10 | Disposition: A | Discharge status: Home / Self Care | Payer: Medicare Other | Attending: Emergency Medicine | Admitting: Emergency Medicine

## 2018-10-10 ENCOUNTER — Emergency Department: Payer: Medicare Other

## 2018-10-10 DIAGNOSIS — Z79899 Other long term (current) drug therapy: Secondary | ICD-10-CM | POA: Insufficient documentation

## 2018-10-10 DIAGNOSIS — Z87891 Personal history of nicotine dependence: Secondary | ICD-10-CM | POA: Insufficient documentation

## 2018-10-10 DIAGNOSIS — J449 Chronic obstructive pulmonary disease, unspecified: Secondary | ICD-10-CM | POA: Insufficient documentation

## 2018-10-10 DIAGNOSIS — U071 COVID-19: Secondary | ICD-10-CM

## 2018-10-10 DIAGNOSIS — I10 Essential (primary) hypertension: Secondary | ICD-10-CM | POA: Insufficient documentation

## 2018-10-10 DIAGNOSIS — Z950 Presence of cardiac pacemaker: Secondary | ICD-10-CM | POA: Insufficient documentation

## 2018-10-10 DIAGNOSIS — E119 Type 2 diabetes mellitus without complications: Secondary | ICD-10-CM | POA: Diagnosis not present

## 2018-10-10 DIAGNOSIS — Z85828 Personal history of other malignant neoplasm of skin: Secondary | ICD-10-CM | POA: Insufficient documentation

## 2018-10-10 DIAGNOSIS — R0602 Shortness of breath: Secondary | ICD-10-CM | POA: Diagnosis present

## 2018-10-10 LAB — CBC WITH DIFFERENTIAL/PLATELET
Abs Immature Granulocytes: 0.03 10*3/uL (ref 0.00–0.07)
Basophils Absolute: 0 10*3/uL (ref 0.0–0.1)
Basophils Relative: 0 %
Eosinophils Absolute: 0 10*3/uL (ref 0.0–0.5)
Eosinophils Relative: 0 %
HCT: 43.2 % (ref 39.0–52.0)
Hemoglobin: 15 g/dL (ref 13.0–17.0)
Immature Granulocytes: 0 %
Lymphocytes Relative: 14 %
Lymphs Abs: 1 10*3/uL (ref 0.7–4.0)
MCH: 29.9 pg (ref 26.0–34.0)
MCHC: 34.7 g/dL (ref 30.0–36.0)
MCV: 86.2 fL (ref 80.0–100.0)
Monocytes Absolute: 0.4 10*3/uL (ref 0.1–1.0)
Monocytes Relative: 5 %
Neutro Abs: 5.5 10*3/uL (ref 1.7–7.7)
Neutrophils Relative %: 81 %
Platelets: 186 10*3/uL (ref 150–400)
RBC: 5.01 MIL/uL (ref 4.22–5.81)
RDW: 13.5 % (ref 11.5–15.5)
WBC: 6.9 10*3/uL (ref 4.0–10.5)
nRBC: 0 % (ref 0.0–0.2)

## 2018-10-10 LAB — BASIC METABOLIC PANEL
Anion gap: 12 (ref 5–15)
BUN: 12 mg/dL (ref 8–23)
CO2: 23 mmol/L (ref 22–32)
Calcium: 8.1 mg/dL — ABNORMAL LOW (ref 8.9–10.3)
Chloride: 102 mmol/L (ref 98–111)
Creatinine, Ser: 1.04 mg/dL (ref 0.61–1.24)
GFR calc Af Amer: >60 mL/min (ref >60)
GFR calc non Af Amer: >60 mL/min (ref >60)
Glucose, Bld: 153 mg/dL — ABNORMAL HIGH (ref 70–99)
Potassium: 3.7 mmol/L (ref 3.5–5.1)
Sodium: 137 mmol/L (ref 135–145)

## 2018-10-10 LAB — LACTIC ACID, PLASMA: Lactic Acid, Venous: 1.4 mmol/L (ref 0.5–1.9)

## 2018-10-10 MED ORDER — SODIUM CHLORIDE 0.9 % IV BOLUS
500.0000 mL | Freq: Once | INTRAVENOUS | Status: AC
  Administered 2018-10-10: 12:00:00 500 mL via INTRAVENOUS

## 2018-10-10 NOTE — ED Triage Notes (Signed)
Spoke with pts wife on phone.  Explained patient is unable to have visitor as COVID + and COVID sx patients cannot have visitors at this time per policy.  Wife reports she already has COVID and wants to come back.  Reinforced that at this time he cannot have a visitor and that we also are unable to let her visit because of her COVID status.  Offered to call and keep updated but she hung up phone on this RN.  Charge RN aware.

## 2018-10-10 NOTE — ED Triage Notes (Signed)
Pt dx with COVID 7/23.  Here today because reports woke up with [facility] worse than before. Unlabored, mild tachypnea.  Pt c/o dry cough. No fever at this time. No OTC meds today.

## 2018-10-10 NOTE — Discharge Instructions (Addendum)
Your symptoms are related to COVID-19.  At this time you are not showing signs of pneumonia and your oxygen level is good.  Continue to use Tylenol up to every 4-6 hours for fever as needed.  Make sure to drink plenty of fluids and get as much rest as possible.  Pin touch with your primary care doctor.  Do not hesitate to return to the ER immediately if you have new or worsening shortness of breath, weakness, confusion, or any other new or worsening symptoms that concern you.
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
## 2018-10-10 NOTE — ED Notes (Signed)
Pt laying in bed speaking with this RN in NAD, pt states he is feeling much better since coming in, A&Ox4, will ctm

## 2018-10-20 ENCOUNTER — Other Ambulatory Visit: Payer: Self-pay

## 2018-10-20 ENCOUNTER — Ambulatory Visit (INDEPENDENT_AMBULATORY_CARE_PROVIDER_SITE_OTHER): Payer: Medicare Other | Admitting: Podiatry

## 2018-10-20 ENCOUNTER — Encounter: Payer: Self-pay | Admitting: Podiatry

## 2018-10-20 VITALS — Temp 98.3°F

## 2018-10-20 DIAGNOSIS — M79676 Pain in unspecified toe(s): Secondary | ICD-10-CM | POA: Diagnosis not present

## 2018-10-20 DIAGNOSIS — B351 Tinea unguium: Secondary | ICD-10-CM | POA: Diagnosis not present

## 2018-10-20 DIAGNOSIS — E1142 Type 2 diabetes mellitus with diabetic polyneuropathy: Secondary | ICD-10-CM

## 2018-10-20 DIAGNOSIS — Q828 Other specified congenital malformations of skin: Secondary | ICD-10-CM | POA: Diagnosis not present

## 2018-10-20 NOTE — Progress Notes (Signed)
Complaint:  Visit Type: Patient returns to my office for continued preventative foot care services. Complaint: Patient states" my nails have grown long and thick and become painful to walk and wear shoes" Patient has been diagnosed with DM with no foot complications.  Patient has painful callus left forefoot. The patient presents for preventative foot care services. No changes to ROS  Podiatric Exam: Vascular: dorsalis pedis and posterior tibial pulses are palpable bilateral. Capillary return is immediate. Temperature gradient is WNL. Skin turgor WNL  Sensorium: Normal Semmes Weinstein monofilament test. Normal tactile sensation bilaterally. Nail Exam: Pt has thick disfigured discolored nails with subungual debris noted bilateral entire nail hallux through fifth toenails Ulcer Exam: There is no evidence of ulcer or pre-ulcerative changes or infection. Orthopedic Exam: Muscle tone and strength are WNL. No limitations in general ROM. No crepitus or effusions noted. Foot type and digits show no abnormalities. Bony prominences are unremarkable. Skin:  Porokeratosis sub 3,4 left foot.. No infection or ulcers  Diagnosis:  Onychomycosis, , Pain in right toe, pain in left toes  Debride porokeratosis  Left forefoot.  Treatment & Plan Procedures and Treatment: Consent by patient was obtained for treatment procedures.   Debridement of mycotic and hypertrophic toenails, 1 through 5 bilateral and clearing of subungual debris. No ulceration, no infection noted.  Return Visit-Office Procedure: Patient instructed to return to the office for a follow up visit 3 months for continued evaluation and treatment.    Gardiner Barefoot DPM

## 2019-01-26 ENCOUNTER — Ambulatory Visit: Payer: Medicare Other | Admitting: Podiatry

## 2019-01-26 ENCOUNTER — Other Ambulatory Visit: Payer: Self-pay

## 2019-01-26 ENCOUNTER — Encounter: Payer: Self-pay | Admitting: Podiatry

## 2019-01-26 DIAGNOSIS — B351 Tinea unguium: Secondary | ICD-10-CM

## 2019-01-26 DIAGNOSIS — Q828 Other specified congenital malformations of skin: Secondary | ICD-10-CM

## 2019-01-26 DIAGNOSIS — M79676 Pain in unspecified toe(s): Secondary | ICD-10-CM

## 2019-01-26 NOTE — Progress Notes (Signed)
Complaint:  Visit Type: Patient returns to my office for continued preventative foot care services. Complaint: Patient states" my nails have grown long and thick and become painful to walk and wear shoes" Patient has been diagnosed with DM with no foot complications.  Patient has painful callus left forefoot. The patient presents for preventative foot care services. No changes to ROS  Podiatric Exam: Vascular: dorsalis pedis and posterior tibial pulses are palpable bilateral. Capillary return is immediate. Temperature gradient is WNL. Skin turgor WNL  Sensorium: Normal Semmes Weinstein monofilament test. Normal tactile sensation bilaterally. Nail Exam: Pt has thick disfigured discolored nails with subungual debris noted bilateral entire nail hallux through fifth toenails Ulcer Exam: There is no evidence of ulcer or pre-ulcerative changes or infection. Orthopedic Exam: Muscle tone and strength are WNL. No limitations in general ROM. No crepitus or effusions noted. Foot type and digits show no abnormalities. Bony prominences are unremarkable. Skin:  Porokeratosis sub 3,4 left foot.. No infection or ulcers  Diagnosis:  Onychomycosis, , Pain in right toe, pain in left toes  Debride porokeratosis  Left forefoot.  Treatment & Plan Procedures and Treatment: Consent by patient was obtained for treatment procedures.   Debridement of mycotic and hypertrophic toenails, 1 through 5 bilateral and clearing of subungual debris. No ulceration, no infection noted.  To check on diabetic shoes. Return Visit-Office Procedure: Patient instructed to return to the office for a follow up visit 3 months for continued evaluation and treatment.    Gardiner Barefoot DPM

## 2019-01-27 ENCOUNTER — Telehealth: Payer: Self-pay | Admitting: Podiatry

## 2019-01-27 NOTE — Telephone Encounter (Signed)
Spoke to pts about his diabetic shoes he was measured for in April.  The order was cxled because pt had not been seen since 2019.  Pt is scheduled to see Dr Laurann Montana in December and I have asked him to get a sooner appt and let me know so we can get the diabetic shoes for 2020.

## 2019-04-27 ENCOUNTER — Ambulatory Visit: Payer: Medicare Other | Admitting: Podiatry

## 2019-04-30 ENCOUNTER — Ambulatory Visit: Payer: Medicare Other | Admitting: Podiatry

## 2019-05-07 ENCOUNTER — Other Ambulatory Visit: Payer: Self-pay

## 2019-05-07 ENCOUNTER — Ambulatory Visit: Payer: Medicare Other | Admitting: Podiatry

## 2019-05-07 ENCOUNTER — Encounter: Payer: Self-pay | Admitting: Podiatry

## 2019-05-07 DIAGNOSIS — B351 Tinea unguium: Secondary | ICD-10-CM

## 2019-05-07 DIAGNOSIS — M79676 Pain in unspecified toe(s): Secondary | ICD-10-CM

## 2019-05-07 DIAGNOSIS — E1142 Type 2 diabetes mellitus with diabetic polyneuropathy: Secondary | ICD-10-CM | POA: Diagnosis not present

## 2019-05-07 DIAGNOSIS — Q828 Other specified congenital malformations of skin: Secondary | ICD-10-CM

## 2019-05-07 NOTE — Progress Notes (Signed)
Complaint:  Visit Type: Patient returns to my office for continued preventative foot care services. Complaint: Patient states" my nails have grown long and thick and become painful to walk and wear shoes" Patient has been diagnosed with DM with no foot complications.  Patient has painful callus left forefoot. The patient presents for preventative foot care services. No changes to ROS  Podiatric Exam: Vascular: dorsalis pedis and posterior tibial pulses are palpable bilateral. Capillary return is immediate. Temperature gradient is WNL. Skin turgor WNL  Sensorium: Normal Semmes Weinstein monofilament test. Normal tactile sensation bilaterally. Nail Exam: Pt has thick disfigured discolored nails with subungual debris noted bilateral entire nail hallux through fifth toenails Ulcer Exam: There is no evidence of ulcer or pre-ulcerative changes or infection. Orthopedic Exam: Muscle tone and strength are WNL. No limitations in general ROM. No crepitus or effusions noted. Foot type and digits show no abnormalities. Bony prominences are unremarkable. Skin:  Porokeratosis sub 3,4 left foot.. No infection or ulcers  Diagnosis:  Onychomycosis, , Pain in right toe, pain in left toes  Debride porokeratosis  Left forefoot.  Treatment & Plan Procedures and Treatment: Consent by patient was obtained for treatment procedures.   Debridement of mycotic and hypertrophic toenails, 1 through 5 bilateral and clearing of subungual debris. No ulceration, no infection noted.  To check on diabetic shoes. Return Visit-Office Procedure: Patient instructed to return to the office for a follow up visit 3 months for continued evaluation and treatment.    Gardiner Barefoot DPM

## 2019-08-06 ENCOUNTER — Other Ambulatory Visit: Payer: Self-pay

## 2019-08-06 ENCOUNTER — Ambulatory Visit: Payer: Medicare Other | Admitting: Podiatry

## 2019-08-06 ENCOUNTER — Encounter: Payer: Self-pay | Admitting: Podiatry

## 2019-08-06 DIAGNOSIS — Q828 Other specified congenital malformations of skin: Secondary | ICD-10-CM

## 2019-08-06 DIAGNOSIS — E1142 Type 2 diabetes mellitus with diabetic polyneuropathy: Secondary | ICD-10-CM

## 2019-08-06 DIAGNOSIS — M79676 Pain in unspecified toe(s): Secondary | ICD-10-CM

## 2019-08-06 DIAGNOSIS — B351 Tinea unguium: Secondary | ICD-10-CM

## 2019-08-06 NOTE — Progress Notes (Signed)
This patient returns to the office for evaluation and treatment of long thick painful nails .  This patient is unable to trim his own nails since the patient cannot reach his feet.  Patient says the nails are painful walking and wearing his shoes.  He returns for preventive foot care services.  General Appearance  Alert, conversant and in no acute stress.  Vascular  Dorsalis pedis and posterior tibial  pulses are palpable  bilaterally.  Capillary return is within normal limits  bilaterally. Temperature is within normal limits  bilaterally.  Neurologic  Senn-Weinstein monofilament wire test within normal limits  bilaterally. Muscle power within normal limits bilaterally.  Nails Thick disfigured discolored nails with subungual debris  from hallux to fifth toes bilaterally. No evidence of bacterial infection or drainage bilaterally.  Orthopedic  No limitations of motion  feet .  No crepitus or effusions noted.  No bony pathology or digital deformities noted.  Skin  normotropic skin with no porokeratosis noted bilaterally.  No signs of infections or ulcers noted.   Porokeratosis sub 3 left foot.  Onychomycosis  Pain in toes right foot  Pain in toes left foot  Debridement  of nails  1-5  B/L with a nail nipper.  Nails were then filed using a dremel tool with no incidents. Debride callus with # 15 blade.   RTC  3 months    Gardiner Barefoot DPM

## 2019-09-18 ENCOUNTER — Other Ambulatory Visit: Payer: Self-pay | Admitting: Internal Medicine

## 2019-09-18 DIAGNOSIS — M810 Age-related osteoporosis without current pathological fracture: Secondary | ICD-10-CM

## 2019-11-09 ENCOUNTER — Encounter: Payer: Self-pay | Admitting: Podiatry

## 2019-11-09 ENCOUNTER — Ambulatory Visit: Payer: Medicare Other | Admitting: Podiatry

## 2019-11-09 ENCOUNTER — Other Ambulatory Visit: Payer: Self-pay

## 2019-11-09 DIAGNOSIS — Q828 Other specified congenital malformations of skin: Secondary | ICD-10-CM | POA: Diagnosis not present

## 2019-11-09 DIAGNOSIS — E1142 Type 2 diabetes mellitus with diabetic polyneuropathy: Secondary | ICD-10-CM

## 2019-11-09 DIAGNOSIS — B351 Tinea unguium: Secondary | ICD-10-CM | POA: Diagnosis not present

## 2019-11-09 DIAGNOSIS — M79676 Pain in unspecified toe(s): Secondary | ICD-10-CM | POA: Diagnosis not present

## 2019-11-09 NOTE — Progress Notes (Signed)
This patient returns to the office for evaluation and treatment of long thick painful nails .  This patient is unable to trim his own nails since the patient cannot reach his feet.  Patient says the nails are painful walking and wearing his shoes.  He returns for preventive foot care services.  General Appearance  Alert, conversant and in no acute stress.  Vascular  Dorsalis pedis and posterior tibial  pulses are palpable  bilaterally.  Capillary return is within normal limits  bilaterally. Temperature is within normal limits  bilaterally.  Neurologic  Senn-Weinstein monofilament wire test within normal limits  bilaterally. Muscle power within normal limits bilaterally.  Nails Thick disfigured discolored nails with subungual debris  from hallux to fifth toes bilaterally. No evidence of bacterial infection or drainage bilaterally.  Orthopedic  No limitations of motion  feet .  No crepitus or effusions noted.  No bony pathology or digital deformities noted.  Skin  normotropic skin with no porokeratosis noted bilaterally.  No signs of infections or ulcers noted.   Porokeratosis sub 4 left foot.  Onychomycosis  Pain in toes right foot  Pain in toes left foot  Debridement  of nails  1-5  B/L with a nail nipper.  Nails were then filed using a dremel tool with no incidents. Debride callus with # 15 blade.   RTC  3 months    Gardiner Barefoot DPM

## 2019-11-23 ENCOUNTER — Other Ambulatory Visit: Payer: Self-pay

## 2019-11-23 ENCOUNTER — Inpatient Hospital Stay
Admission: EM | Admit: 2019-11-23 | Discharge: 2019-11-25 | DRG: 871 | Disposition: A | Discharge status: Home / Self Care | Payer: Medicare Other | Attending: Internal Medicine | Admitting: Internal Medicine

## 2019-11-23 ENCOUNTER — Emergency Department: Payer: Medicare Other

## 2019-11-23 ENCOUNTER — Encounter: Payer: Self-pay | Admitting: Internal Medicine

## 2019-11-23 DIAGNOSIS — H9193 Unspecified hearing loss, bilateral: Secondary | ICD-10-CM | POA: Diagnosis present

## 2019-11-23 DIAGNOSIS — Z886 Allergy status to analgesic agent status: Secondary | ICD-10-CM | POA: Diagnosis not present

## 2019-11-23 DIAGNOSIS — Z888 Allergy status to other drugs, medicaments and biological substances status: Secondary | ICD-10-CM

## 2019-11-23 DIAGNOSIS — I482 Chronic atrial fibrillation, unspecified: Secondary | ICD-10-CM | POA: Diagnosis present

## 2019-11-23 DIAGNOSIS — Z20822 Contact with and (suspected) exposure to covid-19: Secondary | ICD-10-CM | POA: Diagnosis present

## 2019-11-23 DIAGNOSIS — M069 Rheumatoid arthritis, unspecified: Secondary | ICD-10-CM | POA: Diagnosis present

## 2019-11-23 DIAGNOSIS — M810 Age-related osteoporosis without current pathological fracture: Secondary | ICD-10-CM | POA: Diagnosis present

## 2019-11-23 DIAGNOSIS — Z8616 Personal history of COVID-19: Secondary | ICD-10-CM | POA: Diagnosis not present

## 2019-11-23 DIAGNOSIS — Z87891 Personal history of nicotine dependence: Secondary | ICD-10-CM

## 2019-11-23 DIAGNOSIS — Z79899 Other long term (current) drug therapy: Secondary | ICD-10-CM

## 2019-11-23 DIAGNOSIS — Z85828 Personal history of other malignant neoplasm of skin: Secondary | ICD-10-CM | POA: Diagnosis not present

## 2019-11-23 DIAGNOSIS — J189 Pneumonia, unspecified organism: Secondary | ICD-10-CM | POA: Diagnosis present

## 2019-11-23 DIAGNOSIS — J449 Chronic obstructive pulmonary disease, unspecified: Secondary | ICD-10-CM | POA: Diagnosis present

## 2019-11-23 DIAGNOSIS — E785 Hyperlipidemia, unspecified: Secondary | ICD-10-CM | POA: Diagnosis present

## 2019-11-23 DIAGNOSIS — J440 Chronic obstructive pulmonary disease with acute lower respiratory infection: Secondary | ICD-10-CM | POA: Diagnosis present

## 2019-11-23 DIAGNOSIS — R0602 Shortness of breath: Secondary | ICD-10-CM | POA: Diagnosis present

## 2019-11-23 DIAGNOSIS — E1165 Type 2 diabetes mellitus with hyperglycemia: Secondary | ICD-10-CM | POA: Diagnosis present

## 2019-11-23 DIAGNOSIS — Z950 Presence of cardiac pacemaker: Secondary | ICD-10-CM

## 2019-11-23 DIAGNOSIS — Z8249 Family history of ischemic heart disease and other diseases of the circulatory system: Secondary | ICD-10-CM | POA: Diagnosis not present

## 2019-11-23 DIAGNOSIS — D849 Immunodeficiency, unspecified: Secondary | ICD-10-CM | POA: Diagnosis present

## 2019-11-23 DIAGNOSIS — A419 Sepsis, unspecified organism: Secondary | ICD-10-CM | POA: Diagnosis not present

## 2019-11-23 DIAGNOSIS — M0520 Rheumatoid vasculitis with rheumatoid arthritis of unspecified site: Secondary | ICD-10-CM | POA: Diagnosis present

## 2019-11-23 DIAGNOSIS — E1169 Type 2 diabetes mellitus with other specified complication: Secondary | ICD-10-CM | POA: Diagnosis not present

## 2019-11-23 DIAGNOSIS — I442 Atrioventricular block, complete: Secondary | ICD-10-CM | POA: Diagnosis present

## 2019-11-23 DIAGNOSIS — I10 Essential (primary) hypertension: Secondary | ICD-10-CM | POA: Diagnosis present

## 2019-11-23 DIAGNOSIS — Z7982 Long term (current) use of aspirin: Secondary | ICD-10-CM | POA: Diagnosis not present

## 2019-11-23 DIAGNOSIS — R509 Fever, unspecified: Secondary | ICD-10-CM

## 2019-11-23 LAB — APTT: aPTT: 31 seconds (ref 24–36)

## 2019-11-23 LAB — CBC
HCT: 43.3 % (ref 39.0–52.0)
Hemoglobin: 15.3 g/dL (ref 13.0–17.0)
MCH: 30.5 pg (ref 26.0–34.0)
MCHC: 35.3 g/dL (ref 30.0–36.0)
MCV: 86.3 fL (ref 80.0–100.0)
Platelets: 268 10*3/uL (ref 150–400)
RBC: 5.02 MIL/uL (ref 4.22–5.81)
RDW: 13.5 % (ref 11.5–15.5)
WBC: 12.2 10*3/uL — ABNORMAL HIGH (ref 4.0–10.5)
nRBC: 0 % (ref 0.0–0.2)

## 2019-11-23 LAB — BASIC METABOLIC PANEL
Anion gap: 9 (ref 5–15)
BUN: 12 mg/dL (ref 8–23)
CO2: 27 mmol/L (ref 22–32)
Calcium: 8.5 mg/dL — ABNORMAL LOW (ref 8.9–10.3)
Chloride: 100 mmol/L (ref 98–111)
Creatinine, Ser: 1.23 mg/dL (ref 0.61–1.24)
GFR calc Af Amer: >60 mL/min (ref >60)
GFR calc non Af Amer: >60 mL/min (ref >60)
Glucose, Bld: 235 mg/dL — ABNORMAL HIGH (ref 70–99)
Potassium: 4.5 mmol/L (ref 3.5–5.1)
Sodium: 136 mmol/L (ref 135–145)

## 2019-11-23 LAB — SARS CORONAVIRUS 2 BY RT PCR (HOSPITAL ORDER, PERFORMED IN ~~LOC~~ HOSPITAL LAB): SARS Coronavirus 2: NEGATIVE

## 2019-11-23 LAB — URINALYSIS, COMPLETE (UACMP) WITH MICROSCOPIC
Bacteria, UA: NONE SEEN
Bilirubin Urine: NEGATIVE
Glucose, UA: NEGATIVE mg/dL
Ketones, ur: NEGATIVE mg/dL
Leukocytes,Ua: NEGATIVE
Nitrite: NEGATIVE
Protein, ur: NEGATIVE mg/dL
Specific Gravity, Urine: 1.013 (ref 1.005–1.030)
pH: 5 (ref 5.0–8.0)

## 2019-11-23 LAB — PROCALCITONIN: Procalcitonin: 0.1 ng/mL

## 2019-11-23 LAB — TROPONIN I (HIGH SENSITIVITY): Troponin I (High Sensitivity): 12 ng/L (ref <18)

## 2019-11-23 LAB — LACTIC ACID, PLASMA: Lactic Acid, Venous: 1.9 mmol/L (ref 0.5–1.9)

## 2019-11-23 LAB — GLUCOSE, CAPILLARY: Glucose-Capillary: 159 mg/dL — ABNORMAL HIGH (ref 70–99)

## 2019-11-23 LAB — PROTIME-INR
INR: 1.2 (ref 0.8–1.2)
Prothrombin Time: 14.5 seconds (ref 11.4–15.2)

## 2019-11-23 MED ORDER — LACTATED RINGERS IV SOLN: INTRAVENOUS | Status: DC

## 2019-11-23 MED ORDER — ALBUTEROL SULFATE (2.5 MG/3ML) 0.083% IN NEBU: 3.0000 mL | INHALATION_SOLUTION | RESPIRATORY_TRACT | Status: DC | PRN

## 2019-11-23 MED ORDER — OXYCODONE-ACETAMINOPHEN 5-325 MG PO TABS: 1.0000 | ORAL_TABLET | ORAL | Status: DC | PRN

## 2019-11-23 MED ORDER — SODIUM CHLORIDE 0.9 % IV SOLN
500.0000 mg | Freq: Once | INTRAVENOUS | Status: AC
  Administered 2019-11-23: 500 mg via INTRAVENOUS
  Filled 2019-11-23: qty 500

## 2019-11-23 MED ORDER — SODIUM CHLORIDE 0.9 % IV SOLN
2.0000 g | Freq: Three times a day (TID) | INTRAVENOUS | Status: DC
  Administered 2019-11-23 – 2019-11-25 (×6): 2 g via INTRAVENOUS
  Filled 2019-11-23 (×9): qty 2

## 2019-11-23 MED ORDER — ENOXAPARIN SODIUM 40 MG/0.4ML ~~LOC~~ SOLN
40.0000 mg | SUBCUTANEOUS | Status: DC
  Administered 2019-11-23 – 2019-11-24 (×2): 40 mg via SUBCUTANEOUS
  Filled 2019-11-23 (×2): qty 0.4

## 2019-11-23 MED ORDER — LACTATED RINGERS IV BOLUS (SEPSIS)
1000.0000 mL | Freq: Once | INTRAVENOUS | Status: AC
  Administered 2019-11-23: 1000 mL via INTRAVENOUS

## 2019-11-23 MED ORDER — IPRATROPIUM BROMIDE HFA 17 MCG/ACT IN AERS
2.0000 | INHALATION_SPRAY | RESPIRATORY_TRACT | Status: DC
  Administered 2019-11-23 – 2019-11-25 (×11): 2 via RESPIRATORY_TRACT
  Filled 2019-11-23 (×3): qty 12.9

## 2019-11-23 MED ORDER — SODIUM CHLORIDE 0.9 % IV SOLN: INTRAVENOUS | Status: DC

## 2019-11-23 MED ORDER — LATANOPROST 0.005 % OP SOLN
1.0000 [drp] | Freq: Every day | OPHTHALMIC | Status: DC
  Administered 2019-11-24: 1 [drp] via OPHTHALMIC
  Filled 2019-11-23: qty 2.5

## 2019-11-23 MED ORDER — ONDANSETRON HCL 4 MG/2ML IJ SOLN: 4.0000 mg | Freq: Three times a day (TID) | INTRAMUSCULAR | Status: DC | PRN

## 2019-11-23 MED ORDER — VANCOMYCIN HCL IN DEXTROSE 1-5 GM/200ML-% IV SOLN
1000.0000 mg | Freq: Once | INTRAVENOUS | Status: AC
  Administered 2019-11-23: 1000 mg via INTRAVENOUS

## 2019-11-23 MED ORDER — ATORVASTATIN CALCIUM 20 MG PO TABS
20.0000 mg | ORAL_TABLET | Freq: Every day | ORAL | Status: DC
  Administered 2019-11-23 – 2019-11-25 (×3): 20 mg via ORAL
  Filled 2019-11-23 (×3): qty 1

## 2019-11-23 MED ORDER — VANCOMYCIN HCL IN DEXTROSE 1-5 GM/200ML-% IV SOLN
1000.0000 mg | Freq: Once | INTRAVENOUS | Status: DC
  Filled 2019-11-23: qty 200

## 2019-11-23 MED ORDER — VANCOMYCIN HCL IN DEXTROSE 1-5 GM/200ML-% IV SOLN
1000.0000 mg | Freq: Once | INTRAVENOUS | Status: AC
  Administered 2019-11-23: 1000 mg via INTRAVENOUS
  Filled 2019-11-23: qty 200

## 2019-11-23 MED ORDER — DM-GUAIFENESIN ER 30-600 MG PO TB12: 1.0000 | ORAL_TABLET | Freq: Two times a day (BID) | ORAL | Status: DC | PRN

## 2019-11-23 MED ORDER — ASPIRIN EC 81 MG PO TBEC
81.0000 mg | DELAYED_RELEASE_TABLET | Freq: Every day | ORAL | Status: DC
  Filled 2019-11-23: qty 1

## 2019-11-23 MED ORDER — ASPIRIN 81 MG PO CHEW
81.0000 mg | CHEWABLE_TABLET | Freq: Every day | ORAL | Status: DC
  Administered 2019-11-23 – 2019-11-25 (×3): 81 mg via ORAL
  Filled 2019-11-23 (×3): qty 1

## 2019-11-23 MED ORDER — SODIUM CHLORIDE 0.9 % IV SOLN: 2.0000 g | Freq: Once | INTRAVENOUS | Status: DC

## 2019-11-23 MED ORDER — VITAMIN D 25 MCG (1000 UNIT) PO TABS
1000.0000 [IU] | ORAL_TABLET | Freq: Every day | ORAL | Status: DC
  Administered 2019-11-23 – 2019-11-25 (×3): 1000 [IU] via ORAL
  Filled 2019-11-23 (×4): qty 1

## 2019-11-23 MED ORDER — ACETAMINOPHEN 325 MG PO TABS: 650.0000 mg | ORAL_TABLET | Freq: Four times a day (QID) | ORAL | Status: DC | PRN

## 2019-11-23 MED ORDER — LACTATED RINGERS IV BOLUS (SEPSIS)
500.0000 mL | Freq: Once | INTRAVENOUS | Status: AC
  Administered 2019-11-23: 500 mL via INTRAVENOUS

## 2019-11-23 MED ORDER — VANCOMYCIN HCL 750 MG/150ML IV SOLN
750.0000 mg | Freq: Two times a day (BID) | INTRAVENOUS | Status: DC
  Administered 2019-11-24 – 2019-11-25 (×3): 750 mg via INTRAVENOUS
  Filled 2019-11-23 (×5): qty 150

## 2019-11-23 NOTE — Consult Note (Addendum)
Pharmacy Antibiotic Note  Darryl Jones is a 64 y.o. male admitted on 11/23/2019 with sepsis secondary to CAP. PMH includes COPD, diabetes, afib s/p pacemaker, HLD, HTN, and rheumatoid arthritis. Pt presenting with weakness, fever, shortness of breath, and chest pain. Pt has recurrent right PNA with previous empyema. Pt recently had COVID on 7/23. Pharmacy has been consulted for cefepime and vancomycin dosing.  Plan: Pt was given vancomycin 2 g IV and azithromycin 500 mg IV x 1 dose in the ED. Will schedule Cefepime 2 g IV q8h and Vancomycin 750 mg IV every 12 hours.  Goal trough 15-20 mcg/mL. Will monitor Scr with AM labs  Height: 5\' 6"  (167.6 cm) Weight: 104.3 kg (230 lb) IBW/kg (Calculated) : 63.8  Temp (24hrs), Avg:100 F (37.8 C), Min:100 F (37.8 C), Max:100 F (37.8 C)  Recent Labs  Lab 11/23/19 0632  WBC 12.2*  CREATININE 1.23    Estimated Creatinine Clearance: 67.8 mL/min (by C-G formula based on SCr of 1.23 mg/dL).    Allergies  Allergen Reactions   Claritin [Loratadine] Anaphylaxis   Hydroxyquinolines Anaphylaxis   Other Anaphylaxis   Arava [Leflunomide]    Humira [Adalimumab]    Methotrexate Derivatives Other (See Comments)    Elevated lft's   Aspirin Rash   Protonix [Pantoprazole Sodium] Nausea Only    Antimicrobials this admission: 9/13 azithromycin x 1 dose 9/13 cefepime >>  9/13 vancomycin >>  Microbiology results: 9/13 BCx: pending 9/13 COVID PCR: pending 9/13 UCx: pending  Thank you for allowing pharmacy to be a part of this patients care.  Benn Moulder, PharmD Pharmacy Resident  11/23/2019 1:00 PM

## 2019-11-23 NOTE — Progress Notes (Signed)
CODE SEPSIS - PHARMACY COMMUNICATION  **Broad Spectrum Antibiotics should be administered within 1 hour of Sepsis diagnosis**  Time Code Sepsis Called/Page Received: 1143  Antibiotics Ordered: vancomycin, cefepime, azithromycin  Time of 1st antibiotic administration: 1307  Additional action taken by pharmacy: Called RN at 1234 about timing of abx and change in abx  If necessary, Name of Provider/Nurse Contacted:    Rocky Morel ,PharmD Clinical Pharmacist  11/23/2019  11:49 AM

## 2019-11-23 NOTE — ED Notes (Signed)
Pt resting at this time. Expiratory wheezes noted. RR unlabored

## 2019-11-23 NOTE — Progress Notes (Signed)
Notified bedside nurse of need to draw lactic acid.  

## 2019-11-23 NOTE — ED Notes (Addendum)
Per IV team, unable to establish 2nd line. Dr Blaine Hamper notified
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
## 2019-11-23 NOTE — Progress Notes (Signed)
Notified bedside nurse of need to administer antibiotics and fluids.  

## 2019-11-23 NOTE — H&P (Signed)
History and Physical    DAMARE SERANO VHQ:[id number] DOB: 1955-12-13 DOA: 11/23/2019  Referring MD/NP/PA:   PCP: Lavone Orn, MD   Patient coming from:  The patient is coming from home.  At baseline, pt is independent for most of ADL.        Chief Complaint: cough, SOB, chest pain, fever and chill.  HPI: ROALD LUKACS is a 64 y.o. male with medical history significant of rheumatoid arthritis on Xeljant, hypertension, hyperlipidemia, COPD, skin cancer, hard of hearing, atrial fibrillation not on anticoagulants, third-degree AV block, pacemaker placement, pneumonia with empyema, covid 19 infection 10/09/18, who presents with cough, shortness breath, chest pain, fever and chills.  Patient states that he has been sick for 4 days, symptoms include cough, shortness of breath, chest pain, fever and chills.  Patient also has generalized weakness.  The chest pain is located in the right side of chest, sharp, pleuritic, moderate, nonradiating, 6-10 out of 10 severity. His symptoms have been progressively worsening.  Patient denies nausea, vomiting, diarrhea, abdominal pain, symptoms of UTI or unilateral weakness.  Patient states that he is fully vaccinated for Covid.    ED Course: pt was found to have WBC 12.2, troponin 12, pending COVID-19 PCR, pending urinalysis, creatinine 1.23, BUN , GFR> 60, temperature 100, blood pressure 141/84, heart rate 84, RR 25, oxygen saturation 97% on room air.  Chest x-ray with right middle lobe infiltration.  Patient is admitted to [facility] bed as inpatient.   Review of Systems:   General: has fevers, chills, no body weight gain, has poor appetite, has fatigue HEENT: no blurry vision, hearing changes or sore throat Respiratory: has dyspnea, coughing, no wheezing CV: no chest pain, no palpitations GI: no nausea, vomiting, abdominal pain, diarrhea, constipation GU: no dysuria, burning on urination, increased urinary frequency, hematuria  Ext: no leg edema Neuro:  no unilateral weakness, numbness, or tingling, no vision change or hearing loss Skin: no rash, no skin tear. MSK: No muscle spasm, no deformity, no limitation of range of movement in spin Heme: No easy bruising.  Travel history: No recent long distant travel.  Allergy:  Allergies  Allergen Reactions  . Claritin [Loratadine] Anaphylaxis  . Hydroxyquinolines Anaphylaxis  . Other Anaphylaxis  . Arava [Leflunomide]   . Humira [Adalimumab]   . Methotrexate Derivatives Other (See Comments)    Elevated lft's  . Aspirin Rash  . Protonix [Pantoprazole Sodium] Nausea Only    Past Medical History:  Diagnosis Date  . 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 Cancer ([place])    Hx; of Basal cell skin cancers of the lip and nose  . COPD (chronic obstructive pulmonary disease) ([place])   . Diabetes mellitus without complication ([place])   . Elevated liver enzymes    Hx: of "due to methotrexate"  . 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 Hyperlipidemia    Hx: of  . Osteoporosis    Hx: of  . Rosacea, acne    Hx: of  . Shortness of breath     Past Surgical History:  Procedure Laterality Date  . ABDOMINAL EXPLORATION SURGERY    . HERNIA REPAIR     umbilical  . KNEE SURGERY    . PACEMAKER INSERTION Left 10/28/2017   Procedure: INSERTION PACEMAKER-INITIAL INSERT DUAL CHAMBER;  Surgeon: Isaias Cowman, MD;  Location: [facility] ORS;  Service: Cardiovascular;  Laterality: Left;  . SUBMANDIBULAR GLAND EXCISION Right 01/07/2013   Dr Constance Holster  .  SUBMANDIBULAR GLAND EXCISION Right 01/07/2013   Procedure: REMOVAL OF RIGHT SUBMANDIBULAR GLAND;  Surgeon: Izora Gala, MD;  Location: [facility];  Service: ENT;  Laterality: Right;  . TONSILLECTOMY      Social History:  reports that he quit smoking about 9 years ago. His smoking use included cigarettes. He has never used smokeless tobacco. He reports that he does not drink alcohol and does not use drugs.  Family History:  Family History  Problem Relation Age  of Onset  . Diabetes Sister   . Heart disease Sister   . Kidney disease Sister   . Diabetes Brother      Prior to Admission medications   Medication Sig Start Date End Date Taking? Authorizing Provider  acetaminophen (TYLENOL) 650 MG CR tablet Take 650-1,300 mg by mouth every 8 (eight) hours as needed for pain or fever.    [provider]  Albuterol Sulfate (PROAIR RESPICLICK) 157 (90 Base) MCG/ACT AEPB Inhale 2 puffs into the lungs every 6 (six) hours as needed (respiratory difficulties).    [provider]  aspirin EC 81 MG tablet Take by mouth. 10/28/18   [provider]  atorvastatin (LIPITOR) 20 MG tablet Take 20 mg by mouth daily.    [provider]  latanoprost (XALATAN) 0.005 % ophthalmic solution  03/26/17   [provider]  STIOLTO RESPIMAT 2.5-2.5 MCG/ACT AERS Inhale 2 puffs into the lungs daily. 08/29/18   [provider]  Tofacitinib Citrate (XELJANZ) 5 MG TABS Take 5 mg by mouth 2 (two) times daily.    [provider]  vitamin E 400 UNIT capsule Take by mouth.    [provider]    Physical Exam: Vitals:   11/23/19 1200 11/23/19 1230 11/23/19 1300 11/23/19 1400  BP: (!) 157/85 (!) 150/84 (!) 170/103 (!) 165/99  Pulse: 84 83 83 85  Resp: (!) 25 (!) 25 (!) 24 (!) 24  Temp:      TempSrc:      SpO2: 100% 100% 98% 97%  Weight:      Height:       General: Not in acute distress HEENT:       Eyes: PERRL, EOMI, no scleral icterus.       ENT: No discharge from the ears and nose, no pharynx injection, no tonsillar enlargement.        Neck: No JVD, no bruit, no mass felt. Heme: No neck lymph node enlargement. Cardiac: S1/S2, RRR, No murmurs, No gallops or rubs. Respiratory: has coarse breathing sound and decreased air movement on the right side GI: Soft, nondistended, nontender, no rebound pain, no organomegaly, BS present. GU: No hematuria Ext: No pitting leg edema bilaterally. 1+DP/PT pulse  bilaterally. Musculoskeletal: No joint deformities, No joint redness or warmth, no limitation of ROM in spin. Skin: No rashes.  Neuro: Alert, oriented X3, cranial nerves II-XII grossly intact, moves all extremities normally.  Psych: Patient is not psychotic, no suicidal or hemocidal ideation.  Labs on Admission: I have personally reviewed following labs and imaging studies  CBC: Recent Labs  Lab 11/23/19 0632  WBC 12.2*  HGB 15.3  HCT 43.3  MCV 86.3  PLT 262   Basic Metabolic Panel: Recent Labs  Lab 11/23/19 0632  NA 136  K 4.5  CL 100  CO2 27  GLUCOSE 235*  BUN 12  CREATININE 1.23  CALCIUM 8.5*   GFR: Estimated Creatinine Clearance: 67.8 mL/min (by C-G formula based on SCr of 1.23 mg/dL). Liver Function Tests:  No results for input(s): AST, ALT, ALKPHOS, BILITOT, PROT, ALBUMIN in the last 168 hours. No results for input(s): LIPASE, AMYLASE in the last 168 hours. No results for input(s): AMMONIA in the last 168 hours. Coagulation Profile: No results for input(s): INR, PROTIME in the last 168 hours. Cardiac Enzymes: No results for input(s): CKTOTAL, CKMB, CKMBINDEX, TROPONINI in the last 168 hours. BNP (last 3 results) No results for input(s): PROBNP in the last 8760 hours. HbA1C: No results for input(s): HGBA1C in the last 72 hours. CBG: Recent Labs  Lab 11/23/19 1609  GLUCAP 159*   Lipid Profile: No results for input(s): CHOL, HDL, LDLCALC, TRIG, CHOLHDL, LDLDIRECT in the last 72 hours. Thyroid Function Tests: No results for input(s): TSH, T4TOTAL, FREET4, T3FREE, THYROIDAB in the last 72 hours. Anemia Panel: No results for input(s): VITAMINB12, FOLATE, FERRITIN, TIBC, IRON, RETICCTPCT in the last 72 hours. Urine analysis:    Component Value Date/Time   COLORURINE YELLOW (A) 11/23/2019 1433   APPEARANCEUR CLEAR (A) 11/23/2019 1433   LABSPEC 1.013 11/23/2019 1433   PHURINE 5.0 11/23/2019 1433   GLUCOSEU NEGATIVE 11/23/2019 1433   HGBUR MODERATE (A)  11/23/2019 1433   BILIRUBINUR NEGATIVE 11/23/2019 1433   KETONESUR NEGATIVE 11/23/2019 1433   PROTEINUR NEGATIVE 11/23/2019 1433   UROBILINOGEN 0.2 03/12/2012 2205   NITRITE NEGATIVE 11/23/2019 1433   LEUKOCYTESUR NEGATIVE 11/23/2019 1433   Sepsis Labs: @LABRCNTIP (procalcitonin:4,lacticidven:4) ) Recent Results (from the past 240 hour(s))  SARS Coronavirus 2 by RT PCR (hospital order, performed in [facility] hospital lab) Nasopharyngeal Nasopharyngeal Swab     Status: None   Collection Time: 11/23/19 11:38 AM   Specimen: Nasopharyngeal Swab  Result Value Ref Range Status   SARS Coronavirus 2 NEGATIVE NEGATIVE Final    Comment: (NOTE) SARS-CoV-2 target nucleic acids are NOT DETECTED.  The SARS-CoV-2 RNA is generally detectable in upper and lower respiratory specimens during the acute phase of infection. The lowest concentration of SARS-CoV-2 viral copies this assay can detect is 250 copies / mL. A negative result does not preclude SARS-CoV-2 infection and should not be used as the sole basis for treatment or other patient management decisions.  A negative result may occur with improper specimen collection / handling, submission of specimen other than nasopharyngeal swab, presence of viral mutation(s) within the areas targeted by this assay, and inadequate number of viral copies (<250 copies / mL). A negative result must be combined with clinical observations, patient history, and epidemiological information.  Fact Sheet for Patients:   StrictlyIdeas.no  Fact Sheet for Healthcare Providers: BankingDealers.co.za  This test is not yet approved or  cleared by the [place] FDA and has been authorized for detection and/or diagnosis of SARS-CoV-2 by FDA under an Emergency Use Authorization (EUA).  This EUA will remain in effect (meaning this test can be used) for the duration of the COVID-19 declaration under Section 564(b)(1) of  the Act, 21 U.S.C. section 360bbb-3(b)(1), unless the authorization is terminated or revoked sooner.  Performed at [facility], [address] [place], [place] [address]      Radiological Exams on Admission: DG Chest 2 View  Result Date: 11/23/2019 CLINICAL DATA:  Chest pain and short of breath. EXAM: CHEST - 2 VIEW COMPARISON:  10/10/2018 FINDINGS: Right middle lobe infiltrate consistent with pneumonia. No significant pleural effusion. Dual lead pacemaker unchanged. No heart failure or edema. Surgical staple lines in the right lung base and right upper lobe. No acute skeletal abnormality. IMPRESSION: Right middle lobe infiltrate  consistent with pneumonia. Electronically Signed   By: Franchot Gallo M.D.   On: 11/23/2019 07:35     EKG:  Not done in ED, will get one.   Assessment/Plan Principal Problem:   CAP (community acquired pneumonia) Active Problems:   Hyperlipidemia   Hypertension   Atrial fibrillation, chronic (HCC)   COPD (chronic obstructive pulmonary disease) ([place])   Sepsis ([place])   Rheumatoid arteritis ([place])   Sepsis due to CAP (community acquired pneumonia): Patient meets criteria for sepsis with leukocytosis and tachypnea with RR 25.  Pending lactic acid.  Currently hemodynamically stable.  Patient has history of rheumatoid arthritis, currently on Xeljant. He is immunosuppressed, will treat patient with broad antibiotics.  - Will admit to med-surg bed as inpt - IV Vancomycin and cefepime (patient received 1 dose of Rocephin and azithromycin in ED) - Mucinex for cough  - Bronchodilators - Urine legionella and S. pneumococcal antigen - Follow up blood culture x2, sputum culture - will get Procalcitonin and trend lactic acid level per sepsis protocol - IVF: 3.5 L of LR bolus in ED, followed by 125 mL per hour  COPD (chronic obstructive pulmonary disease) ([place]): -Bronchodilators as above  Hyperlipidemia -lipitor  Hypertension: pt is not taking meds at  home currently for blood pressure 141/84 -Will not start blood pressure medication since patient is at high risk of developing hypotension due to sepsis -monitoring Bp closely  History of atrial fibrillation, chronic ([place]): Heart rate 84.  Patient is not taking nodal blocker or anticoagulants. -Cardiac monitor  Rheumatoid arteritis ([place]): -hold Xeljant now         DVT ppx: SQ Lovenox Code Status: Full code Family Communication:   Yes, patient's  wife at bed side Disposition Plan:  Anticipate discharge back to previous environment Consults called:  none Admission status: Med-surg bed as inpt  Status is: Inpatient  Remains inpatient appropriate because:Inpatient level of care appropriate due to severity of illness.  Patient has multiple comorbidities, now presents with pneumonia with sepsis.  His presentation is highly complicated.  Patient is at high risk of deteriorating due to immunosuppressed status.  Will need to be treated in hospital for at least 2 days.   Dispo: The patient is from: Home              Anticipated d/c is to: Home              Anticipated d/c date is: 2 days              Patient currently is not medically stable to d/c.          Date of Service 11/23/2019    Ivor Costa [facility]   If 7PM-7AM, please contact night-coverage [email or web address] 11/23/2019, 5:23 PM

## 2019-11-23 NOTE — Progress Notes (Signed)
PHARMACY -  BRIEF ANTIBIOTIC NOTE   Pharmacy has received consult(s) for vancomycin from an ED provider.  The patient's profile has been reviewed for ht/wt/allergies/indication/available labs.    One time order(s) placed for Vancomycin 1 g IV x1 by EDP. Will order another vancomycin 1 g IV x1 for total loading dose of 2g.   Further antibiotics/pharmacy consults should be ordered by admitting physician if indicated.                       Thank you, Darryl Jones 11/23/2019  11:46 AM

## 2019-11-23 NOTE — ED Triage Notes (Signed)
Pt states has not felt well since Thursday. Pt complains of right sided chest pain, chills, and shortness of breath. Pt denies vomiting, diarrhea. Pt without resp distress noted.

## 2019-11-23 NOTE — Progress Notes (Signed)
VAST consulted to obtain 2nd IV access for sepsis pt. Attempted IV placement x2 utilizing [place]. Pt's veins in bilateral lower arms are small and short; unsuccessful with USGIV placement. RN notified.

## 2019-11-24 DIAGNOSIS — J189 Pneumonia, unspecified organism: Secondary | ICD-10-CM

## 2019-11-24 DIAGNOSIS — E1169 Type 2 diabetes mellitus with other specified complication: Secondary | ICD-10-CM

## 2019-11-24 DIAGNOSIS — A419 Sepsis, unspecified organism: Principal | ICD-10-CM

## 2019-11-24 LAB — CBC
HCT: 37.7 % — ABNORMAL LOW (ref 39.0–52.0)
Hemoglobin: 12.6 g/dL — ABNORMAL LOW (ref 13.0–17.0)
MCH: 29.5 pg (ref 26.0–34.0)
MCHC: 33.4 g/dL (ref 30.0–36.0)
MCV: 88.3 fL (ref 80.0–100.0)
Platelets: 224 10*3/uL (ref 150–400)
RBC: 4.27 MIL/uL (ref 4.22–5.81)
RDW: 13.3 % (ref 11.5–15.5)
WBC: 9 10*3/uL (ref 4.0–10.5)
nRBC: 0 % (ref 0.0–0.2)

## 2019-11-24 LAB — HEMOGLOBIN A1C
Hgb A1c MFr Bld: 7.6 % — ABNORMAL HIGH (ref 4.8–5.6)
Mean Plasma Glucose: 171.42 mg/dL

## 2019-11-24 LAB — BASIC METABOLIC PANEL
Anion gap: 10 (ref 5–15)
BUN: 12 mg/dL (ref 8–23)
CO2: 26 mmol/L (ref 22–32)
Calcium: 8 mg/dL — ABNORMAL LOW (ref 8.9–10.3)
Chloride: 101 mmol/L (ref 98–111)
Creatinine, Ser: 1.11 mg/dL (ref 0.61–1.24)
GFR calc Af Amer: >60 mL/min (ref >60)
GFR calc non Af Amer: >60 mL/min (ref >60)
Glucose, Bld: 168 mg/dL — ABNORMAL HIGH (ref 70–99)
Potassium: 4.2 mmol/L (ref 3.5–5.1)
Sodium: 137 mmol/L (ref 135–145)

## 2019-11-24 LAB — GLUCOSE, CAPILLARY
Glucose-Capillary: 156 mg/dL — ABNORMAL HIGH (ref 70–99)
Glucose-Capillary: 129 mg/dL — ABNORMAL HIGH (ref 70–99)

## 2019-11-24 LAB — HIV ANTIBODY (ROUTINE TESTING W REFLEX): HIV Screen 4th Generation wRfx: NONREACTIVE

## 2019-11-24 LAB — MRSA PCR SCREENING: MRSA by PCR: NEGATIVE

## 2019-11-24 MED ORDER — INSULIN ASPART 100 UNIT/ML ~~LOC~~ SOLN
0.0000 [IU] | Freq: Three times a day (TID) | SUBCUTANEOUS | Status: DC
  Administered 2019-11-24: 1 [IU] via SUBCUTANEOUS
  Filled 2019-11-24: qty 1

## 2019-11-24 MED ORDER — LIVING WELL WITH DIABETES BOOK
Freq: Once | Status: AC
  Filled 2019-11-24: qty 1

## 2019-11-24 NOTE — Progress Notes (Addendum)
PROGRESS NOTE    Darryl Jones  KGM:[id number] DOB: 23-Oct-1955 DOA: 11/23/2019 PCP: Lavone Orn, MD    Assessment & Plan:   Principal Problem:   CAP (community acquired pneumonia) Active Problems:   Hyperlipidemia   Hypertension   Atrial fibrillation, chronic ([place])   COPD (chronic obstructive pulmonary disease) ([place])   Sepsis ([place])   Rheumatoid arteritis ([place])  Sepsis: secondary to CAP. Meets criteria for sepsis with leukocytosis and tachypnea with RR 25. Pt is immunosuppressive secondary to RA. Continue on IV vanco & cefepime.  Blood cxs are NGTD. Urine cx pending. Continue on bronchodilators and encourage incentive spirometry. Continue on IVFs   CAP: continue on IV vanco, cefepime. Blood cxs NGTD. Continue on bronchodilators and encourage incentive spirometry   COPD: w/o exacerbation. Continue on bronchodilators and encourage incentive spirometry   HLD: continue on statin   Chronic a. fib: not on BB or CCB or anticoagulation at home. Unknown reason  RA: continue to hold home dose of xeljant   DM2: not new & not taking anti- DM meds at home. HbA1c 7.6. Will start SSI w/ accuchecks  DVT prophylaxis: lovenox  Code Status: full  Family Communication:  Disposition Plan: likely d/c back home  Status is: Inpatient  Remains inpatient appropriate because:IV treatments appropriate due to intensity of illness or inability to take PO, can likely d/c home tomorrow if respiratory status has improved    Dispo: The patient is from: Home              Anticipated d/c is to: Home              Anticipated d/c date is: 1 day              Patient currently is not medically stable to d/c.        Consultants:    Procedures:   Antimicrobials: vanco, cefepime    Subjective: Pt c/o shortness of breath  Objective: Vitals:   11/23/19 2100 11/23/19 2130 11/23/19 2220 11/24/19 0434  BP: 139/82 134/86 (!) 126/107 125/72  Pulse: 78 75 76 75  Resp: (!) 26 (!) 25 20 (!) 24   Temp:   99.5 F (37.5 C) 99.2 F (37.3 C)  TempSrc:   Oral Oral  SpO2: 95% 93% 99% 95%  Weight:      Height:        Intake/Output Summary (Last 24 hours) at 11/24/2019 0729 Last data filed at 11/24/2019 0508 Gross per 24 hour  Intake 5090.53 ml  Output 1450 ml  Net 3640.53 ml   Filed Weights   11/23/19 0626  Weight: 104.3 kg    Examination:  General exam: Appears calm and comfortable  Respiratory system: severely diminished breath sounds b/l.  Cardiovascular system: S1 & S2+. No rubs, gallops or clicks. Gastrointestinal system: Abdomen is nondistended, soft and nontender.  Hypoactive bowel sounds heard. Central nervous system: Alert and oriented. Move all 4 extremities  Psychiatry: Judgement and insight appear normal. Flat mood and affec    Data Reviewed: I have personally reviewed following labs and imaging studies  CBC: Recent Labs  Lab 11/23/19 0632  WBC 12.2*  HGB 15.3  HCT 43.3  MCV 86.3  PLT 644   Basic Metabolic Panel: Recent Labs  Lab 11/23/19 0632  NA 136  K 4.5  CL 100  CO2 27  GLUCOSE 235*  BUN 12  CREATININE 1.23  CALCIUM 8.5*   GFR: Estimated Creatinine Clearance: 67.8 mL/min (by C-G formula based on  SCr of 1.23 mg/dL). Liver Function Tests: No results for input(s): AST, ALT, ALKPHOS, BILITOT, PROT, ALBUMIN in the last 168 hours. No results for input(s): LIPASE, AMYLASE in the last 168 hours. No results for input(s): AMMONIA in the last 168 hours. Coagulation Profile: Recent Labs  Lab 11/23/19 1945  INR 1.2   Cardiac Enzymes: No results for input(s): CKTOTAL, CKMB, CKMBINDEX, TROPONINI in the last 168 hours. BNP (last 3 results) No results for input(s): PROBNP in the last 8760 hours. HbA1C: Recent Labs    11/23/19 0632  HGBA1C 7.6*   CBG: Recent Labs  Lab 11/23/19 1609  GLUCAP 159*   Lipid Profile: No results for input(s): CHOL, HDL, LDLCALC, TRIG, CHOLHDL, LDLDIRECT in the last 72 hours. Thyroid Function Tests: No  results for input(s): TSH, T4TOTAL, FREET4, T3FREE, THYROIDAB in the last 72 hours. Anemia Panel: No results for input(s): VITAMINB12, FOLATE, FERRITIN, TIBC, IRON, RETICCTPCT in the last 72 hours. Sepsis Labs: Recent Labs  Lab 11/23/19 1135 11/23/19 1945  PROCALCITON  --  0.10  LATICACIDVEN 1.9  --     Recent Results (from the past 240 hour(s))  Blood Culture (routine x 2)     Status: None (Preliminary result)   Collection Time: 11/23/19 11:38 AM   Specimen: BLOOD  Result Value Ref Range Status   Specimen Description BLOOD RIGHT ANTECUBITAL  Final   Special Requests   Final    BOTTLES DRAWN AEROBIC AND ANAEROBIC Blood Culture adequate volume   Culture   Final    NO GROWTH < 24 HOURS Performed at [facility], [address] [place], [place] [address]    Report Status PENDING  Incomplete  SARS Coronavirus 2 by RT PCR (hospital order, performed in [place] hospital lab) Nasopharyngeal Nasopharyngeal Swab     Status: None   Collection Time: 11/23/19 11:38 AM   Specimen: Nasopharyngeal Swab  Result Value Ref Range Status   SARS Coronavirus 2 NEGATIVE NEGATIVE Final    Comment: (NOTE) SARS-CoV-2 target nucleic acids are NOT DETECTED.  The SARS-CoV-2 RNA is generally detectable in upper and lower respiratory specimens during the acute phase of infection. The lowest concentration of SARS-CoV-2 viral copies this assay can detect is 250 copies / mL. A negative result does not preclude SARS-CoV-2 infection and should not be used as the sole basis for treatment or other patient management decisions.  A negative result may occur with improper specimen collection / handling, submission of specimen other than nasopharyngeal swab, presence of viral mutation(s) within the areas targeted by this assay, and inadequate number of viral copies (<250 copies / mL). A negative result must be combined with clinical observations, patient history, and epidemiological  information.  Fact Sheet for Patients:   StrictlyIdeas.no  Fact Sheet for Healthcare Providers: BankingDealers.co.za  This test is not yet approved or  cleared by the [place] FDA and has been authorized for detection and/or diagnosis of SARS-CoV-2 by FDA under an Emergency Use Authorization (EUA).  This EUA will remain in effect (meaning this test can be used) for the duration of the COVID-19 declaration under Section 564(b)(1) of the Act, 21 U.S.C. section 360bbb-3(b)(1), unless the authorization is terminated or revoked sooner.  Performed at [facility], [address] [place], [place] [address]   Blood Culture (routine x 2)     Status: None (Preliminary result)   Collection Time: 11/23/19 12:50 PM   Specimen: BLOOD  Result Value Ref Range Status   Specimen Description BLOOD LEFT  ANTECUBITAL  Final   Special Requests   Final    BOTTLES DRAWN AEROBIC ONLY Blood Culture adequate volume   Culture   Final    NO GROWTH < 24 HOURS Performed at [facility], [address], [place], [place] [address]    Report Status PENDING  Incomplete         Radiology Studies: DG Chest 2 View  Result Date: 11/23/2019 CLINICAL DATA:  Chest pain and short of breath. EXAM: CHEST - 2 VIEW COMPARISON:  10/10/2018 FINDINGS: Right middle lobe infiltrate consistent with pneumonia. No significant pleural effusion. Dual lead pacemaker unchanged. No heart failure or edema. Surgical staple lines in the right lung base and right upper lobe. No acute skeletal abnormality. IMPRESSION: Right middle lobe infiltrate consistent with pneumonia. Electronically Signed   By: Franchot Gallo M.D.   On: 11/23/2019 07:35        Scheduled Meds: . aspirin  81 mg Oral Daily  . atorvastatin  20 mg Oral Daily  . cholecalciferol  1,000 Units Oral Daily  . enoxaparin (LOVENOX) injection  40 mg Subcutaneous Q24H  . ipratropium  2 puff Inhalation  Q4H  . latanoprost  1 drop Both Eyes QHS   Continuous Infusions: . sodium chloride 75 mL/hr at 11/24/19 0508  . ceFEPime (MAXIPIME) IV Stopped (11/24/19 0147)  . vancomycin 150 mL/hr at 11/24/19 0508     LOS: 1 day    Time spent: 33 mins    Wyvonnia Dusky, MD [facility] Pager [phone]  If 7PM-7AM, please contact night-coverage [email or web address] 11/24/2019, 7:29 AM

## 2019-11-24 NOTE — Progress Notes (Signed)
Inpatient Diabetes Program Recommendations  AACE/ADA: New Consensus Statement on Inpatient Glycemic Control (2015)  Target Ranges:  Prepandial:   less than 140 mg/dL      Peak postprandial:   less than 180 mg/dL (1-2 hours)      Critically ill patients:  140 - 180 mg/dL   Lab Results  Component Value Date   GLUCAP 159 (H) 11/23/2019   HGBA1C 7.6 (H) 11/23/2019    Review of Glycemic Control  Diabetes history: DM2 Outpatient Diabetes medications: None Current orders for Inpatient glycemic control: Novolog 0-6 units tid correction   Inpatient Diabetes Program Recommendations:   -Add Novolog 0-5 units correction hs Ordered Living Well with Diabetes Spoke with patient and wife regarding diabetes. Wife states patient has had type 2 diabetes x 6 years and is not currently taking medications for diabetes. Patient was on Metformin in the past but was later discontinued due to A1c improvement. Patient's A1c increased from 6.2 10/28/17 to current 7.6. Wife states A1c @ his PCP office was also approximately 6.2 in April 2021.Patient has been drinking regular gingerale since he has not been feeling well but willing to change to sugar free.  Will follow during hospitalization.  Thank you, Nani Gasser. Deontaye Civello, RN, MSN, CDE  Diabetes Coordinator Inpatient Glycemic Control Team Team Pager [phone] (8am-5pm) 11/24/2019 2:46 PM

## 2019-11-25 ENCOUNTER — Encounter: Payer: Self-pay | Admitting: Internal Medicine

## 2019-11-25 LAB — CBC
HCT: 39 % (ref 39.0–52.0)
Hemoglobin: 13.2 g/dL (ref 13.0–17.0)
MCH: 29.9 pg (ref 26.0–34.0)
MCHC: 33.8 g/dL (ref 30.0–36.0)
MCV: 88.2 fL (ref 80.0–100.0)
Platelets: 240 10*3/uL (ref 150–400)
RBC: 4.42 MIL/uL (ref 4.22–5.81)
RDW: 13.4 % (ref 11.5–15.5)
WBC: 6.9 10*3/uL (ref 4.0–10.5)
nRBC: 0 % (ref 0.0–0.2)

## 2019-11-25 LAB — BASIC METABOLIC PANEL
Anion gap: 11 (ref 5–15)
BUN: 13 mg/dL (ref 8–23)
CO2: 26 mmol/L (ref 22–32)
Calcium: 8.3 mg/dL — ABNORMAL LOW (ref 8.9–10.3)
Chloride: 103 mmol/L (ref 98–111)
Creatinine, Ser: 1.02 mg/dL (ref 0.61–1.24)
GFR calc Af Amer: >60 mL/min (ref >60)
GFR calc non Af Amer: >60 mL/min (ref >60)
Glucose, Bld: 126 mg/dL — ABNORMAL HIGH (ref 70–99)
Potassium: 3.9 mmol/L (ref 3.5–5.1)
Sodium: 140 mmol/L (ref 135–145)

## 2019-11-25 LAB — URINE CULTURE: Culture: NO GROWTH

## 2019-11-25 LAB — GLUCOSE, CAPILLARY: Glucose-Capillary: 136 mg/dL — ABNORMAL HIGH (ref 70–99)

## 2019-11-25 MED ORDER — XELJANZ 5 MG PO TABS: 5.0000 mg | ORAL_TABLET | Freq: Two times a day (BID) | ORAL | Status: DC

## 2019-11-25 MED ORDER — ADULT MULTIVITAMIN W/MINERALS CH: 1.0000 | ORAL_TABLET | Freq: Every day | ORAL | Status: DC

## 2019-11-25 MED ORDER — AMOXICILLIN-POT CLAVULANATE 875-125 MG PO TABS: 1.0000 | ORAL_TABLET | Freq: Two times a day (BID) | ORAL | 0 refills | Status: AC

## 2019-11-25 MED ORDER — ENSURE MAX PROTEIN PO LIQD
11.0000 [oz_av] | Freq: Two times a day (BID) | ORAL | Status: DC
  Administered 2019-11-25: 11 [oz_av] via ORAL
  Filled 2019-11-25: qty 330

## 2019-11-25 MED ORDER — AZITHROMYCIN 250 MG PO TABS
500.0000 mg | ORAL_TABLET | Freq: Every day | ORAL | Status: DC
  Administered 2019-11-25: 500 mg via ORAL
  Filled 2019-11-25: qty 2

## 2019-11-25 MED ORDER — AMOXICILLIN-POT CLAVULANATE 875-125 MG PO TABS
1.0000 | ORAL_TABLET | Freq: Two times a day (BID) | ORAL | Status: DC
  Administered 2019-11-25: 1 via ORAL
  Filled 2019-11-25: qty 1

## 2019-11-25 MED ORDER — AZITHROMYCIN 500 MG PO TABS: 500.0000 mg | ORAL_TABLET | Freq: Every day | ORAL | 0 refills | Status: AC

## 2019-11-25 NOTE — Discharge Summary (Signed)
Physician Discharge Summary  Darryl Jones FGH:[id number] DOB: 04/23/1955 DOA: 11/23/2019  PCP: Lavone Orn, MD  Admit date: 11/23/2019 Discharge date: 11/25/2019  Discharge disposition: Home   Recommendations for Outpatient Follow-Up:   Follow-up with PCP in 1 week   Discharge Diagnosis:   Principal Problem:   CAP (community acquired pneumonia) Active Problems:   Hyperlipidemia   Hypertension   Atrial fibrillation, chronic ([place])   COPD (chronic obstructive pulmonary disease) ([place])   Sepsis ([place])   Rheumatoid arteritis ([place])    Discharge Condition: Stable.  Diet recommendation:  Diet Order            Diet Carb Modified Fluid consistency: Thin; Room service appropriate? Yes  Diet effective now           Diet - low sodium heart healthy                   Code Status: Full Code     Hospital Course:   Mr. Darryl Jones is a 64 y.o. male with medical history significant of rheumatoid arthritis on Xeljant, hypertension, hyperlipidemia, COPD, skin cancer, hard of hearing, atrial fibrillation not on anticoagulants, third-degree AV block, pacemaker placement, pneumonia with empyema, covid 19 infection 10/09/18.  Presented to the hospital with cough, shortness of breath, chest pain, generalized weakness, fever and chills.  He was admitted to the hospital for sepsis secondary to community-acquired pneumonia in an immunocompromised patient.  He was treated with empiric IV antibiotics. There was no growth on blood culture at the time of discharge.  His condition has improved and he is deemed stable for discharge to home.  He is tolerating room air.  He has been able to ambulate without any symptoms or problems.    Medical Consultants:    None   Discharge Exam:    Vitals:   11/24/19 1155 11/24/19 1510 11/24/19 2100 11/25/19 0503  BP: 130/78 133/73 (!) 146/79 138/78  Pulse: 81 76 73 67  Resp: 16 20 20 16   Temp: 98.6 F (37 C) 99.8 F (37.7 C) 99.5 F (37.5  C) 98.6 F (37 C)  TempSrc: Oral Oral Oral Oral  SpO2: 97% 96% 97% 96%  Weight:      Height:         GEN: NAD SKIN: No rash EYES: EOMI ENT: MMM CV: RRR PULM: CTA B ABD: soft, ND, NT, +BS CNS: AAO x 3, non focal EXT: No edema or tenderness   The results of significant diagnostics from this hospitalization (including imaging, microbiology, ancillary and laboratory) are listed below for reference.     Procedures and Diagnostic Studies:   DG Chest 2 View  Result Date: 11/23/2019 CLINICAL DATA:  Chest pain and short of breath. EXAM: CHEST - 2 VIEW COMPARISON:  10/10/2018 FINDINGS: Right middle lobe infiltrate consistent with pneumonia. No significant pleural effusion. Dual lead pacemaker unchanged. No heart failure or edema. Surgical staple lines in the right lung base and right upper lobe. No acute skeletal abnormality. IMPRESSION: Right middle lobe infiltrate consistent with pneumonia. Electronically Signed   By: Franchot Gallo M.D.   On: 11/23/2019 07:35     Labs:   Basic Metabolic Panel: Recent Labs  Lab 11/23/19 0632 11/23/19 0632 11/24/19 0703 11/25/19 0506  NA 136  --  137 140  K 4.5   < > 4.2 3.9  CL 100  --  101 103  CO2 27  --  26 26  GLUCOSE 235*  --  168*  126*  BUN 12  --  12 13  CREATININE 1.23  --  1.11 1.02  CALCIUM 8.5*  --  8.0* 8.3*   < > = values in this interval not displayed.   GFR Estimated Creatinine Clearance: 81.7 mL/min (by C-G formula based on SCr of 1.02 mg/dL). Liver Function Tests: No results for input(s): AST, ALT, ALKPHOS, BILITOT, PROT, ALBUMIN in the last 168 hours. No results for input(s): LIPASE, AMYLASE in the last 168 hours. No results for input(s): AMMONIA in the last 168 hours. Coagulation profile Recent Labs  Lab 11/23/19 1945  INR 1.2    CBC: Recent Labs  Lab 11/23/19 0632 11/24/19 0703 11/25/19 0506  WBC 12.2* 9.0 6.9  HGB 15.3 12.6* 13.2  HCT 43.3 37.7* 39.0  MCV 86.3 88.3 88.2  PLT 268 224 240    Cardiac Enzymes: No results for input(s): CKTOTAL, CKMB, CKMBINDEX, TROPONINI in the last 168 hours. BNP: Invalid input(s): POCBNP CBG: Recent Labs  Lab 11/23/19 1609 11/24/19 1708 11/24/19 2058 11/25/19 0755  GLUCAP 159* 156* 129* 136*   D-Dimer No results for input(s): DDIMER in the last 72 hours. Hgb A1c Recent Labs    11/23/19 0632  HGBA1C 7.6*   Lipid Profile No results for input(s): CHOL, HDL, LDLCALC, TRIG, CHOLHDL, LDLDIRECT in the last 72 hours. Thyroid function studies No results for input(s): TSH, T4TOTAL, T3FREE, THYROIDAB in the last 72 hours.  Invalid input(s): FREET3 Anemia work up No results for input(s): VITAMINB12, FOLATE, FERRITIN, TIBC, IRON, RETICCTPCT in the last 72 hours. Microbiology Recent Results (from the past 240 hour(s))  Blood Culture (routine x 2)     Status: None (Preliminary result)   Collection Time: 11/23/19 11:38 AM   Specimen: BLOOD  Result Value Ref Range Status   Specimen Description BLOOD RIGHT ANTECUBITAL  Final   Special Requests   Final    BOTTLES DRAWN AEROBIC AND ANAEROBIC Blood Culture adequate volume   Culture   Final    NO GROWTH 2 DAYS Performed at [facility], [address] [place], [place] [address]    Report Status PENDING  Incomplete  SARS Coronavirus 2 by RT PCR (hospital order, performed in [place] hospital lab) Nasopharyngeal Nasopharyngeal Swab     Status: None   Collection Time: 11/23/19 11:38 AM   Specimen: Nasopharyngeal Swab  Result Value Ref Range Status   SARS Coronavirus 2 NEGATIVE NEGATIVE Final    Comment: (NOTE) SARS-CoV-2 target nucleic acids are NOT DETECTED.  The SARS-CoV-2 RNA is generally detectable in upper and lower respiratory specimens during the acute phase of infection. The lowest concentration of SARS-CoV-2 viral copies this assay can detect is 250 copies / mL. A negative result does not preclude SARS-CoV-2 infection and should not be used as the sole basis for  treatment or other patient management decisions.  A negative result may occur with improper specimen collection / handling, submission of specimen other than nasopharyngeal swab, presence of viral mutation(s) within the areas targeted by this assay, and inadequate number of viral copies (<250 copies / mL). A negative result must be combined with clinical observations, patient history, and epidemiological information.  Fact Sheet for Patients:   StrictlyIdeas.no  Fact Sheet for Healthcare Providers: BankingDealers.co.za  This test is not yet approved or  cleared by the [place] FDA and has been authorized for detection and/or diagnosis of SARS-CoV-2 by FDA under an Emergency Use Authorization (EUA).  This EUA will remain in effect (meaning this test can  be used) for the duration of the COVID-19 declaration under Section 564(b)(1) of the Act, 21 U.S.C. section 360bbb-3(b)(1), unless the authorization is terminated or revoked sooner.  Performed at [facility], [address] [place], [place] [address]   Blood Culture (routine x 2)     Status: None (Preliminary result)   Collection Time: 11/23/19 12:50 PM   Specimen: BLOOD  Result Value Ref Range Status   Specimen Description BLOOD LEFT ANTECUBITAL  Final   Special Requests   Final    BOTTLES DRAWN AEROBIC ONLY Blood Culture adequate volume   Culture   Final    NO GROWTH 2 DAYS Performed at [facility], [address], [place], [place] [address]    Report Status PENDING  Incomplete  Urine culture     Status: None   Collection Time: 11/23/19  2:33 PM   Specimen: Urine, Random  Result Value Ref Range Status   Specimen Description   Final    URINE, RANDOM Performed at [facility], [address] [place], [place] [address]    Special Requests   Final    NONE Performed at [facility], [address], [place], [place] [address]     Culture   Final    NO GROWTH Performed at [facility], [place] [address], [place], [place] [address]    Report Status 11/25/2019 FINAL  Final  MRSA PCR Screening     Status: None   Collection Time: 11/24/19 11:00 AM   Specimen: Nasopharyngeal  Result Value Ref Range Status   MRSA by PCR NEGATIVE NEGATIVE Final    Comment:        The GeneXpert MRSA Assay (FDA approved for NASAL specimens only), is one component of a comprehensive MRSA colonization surveillance program. It is not intended to diagnose MRSA infection nor to guide or monitor treatment for MRSA infections. Performed at [facility], [address], [place],  [address]      Discharge Instructions:   Discharge Instructions    Diet - low sodium heart healthy   Complete by: As directed    Increase activity slowly   Complete by: As directed      Allergies as of 11/25/2019      Reactions   Claritin [loratadine] Anaphylaxis   Hydroxyquinolines Anaphylaxis   Other Anaphylaxis   Arava [leflunomide]    Humira [adalimumab]    Methotrexate Derivatives Other (See Comments)   Elevated lft's   Aspirin Rash   Protonix [pantoprazole Sodium] Nausea Only      Medication List    TAKE these medications   acetaminophen 650 MG CR tablet Commonly known as: TYLENOL Take 650-1,300 mg by mouth every 8 (eight) hours as needed for pain or fever.   amoxicillin-clavulanate 875-125 MG tablet Commonly known as: AUGMENTIN Take 1 tablet by mouth every 12 (twelve) hours for 4 days.   aspirin EC 81 MG tablet Take 81 mg by mouth daily.   atorvastatin 20 MG tablet Commonly known as: LIPITOR Take 20 mg by mouth daily.   azithromycin 500 MG tablet Commonly known as: ZITHROMAX Take 1 tablet (500 mg total) by mouth daily for 2 days. Start taking on: November 26, 2019   cholecalciferol 25 MCG (1000 UNIT) tablet Commonly known as: VITAMIN D3 Take 1,000 Units by mouth daily.   latanoprost 0.005 % ophthalmic  solution Commonly known as: XALATAN Place 1 drop into both eyes at bedtime.   ProAir RespiClick 937 (90 Base)  MCG/ACT Aepb Generic drug: Albuterol Sulfate Inhale 2 puffs into the lungs every 6 (six) hours as needed (respiratory difficulties).   Stiolto Respimat 2.5-2.5 MCG/ACT Aers Generic drug: Tiotropium Bromide-Olodaterol Inhale 2 puffs into the lungs daily.   Xeljanz 5 MG Tabs Generic drug: Tofacitinib Citrate Take 1 tablet (5 mg total) by mouth 2 (two) times daily. Start taking on: November 30, 2019 What changed: These instructions start on November 30, 2019. If you are unsure what to do until then, ask your doctor or other care provider.         Time coordinating discharge: 33 minutes  Signed:  Ubah Radke  [facility] 11/25/2019, 11:34 AM   Pager on [email or web address]. If 7PM-7AM, please contact night-coverage at [email or web address]

## 2019-11-25 NOTE — Progress Notes (Signed)
Patient given AVS and instructions at this time. Pt denies questions. Medication regimen educated to patient and wife. Pt states he will get medication at the pharmacy upon discharge. IV removed.

## 2019-11-25 NOTE — Plan of Care (Signed)

## 2019-11-28 LAB — CULTURE, BLOOD (ROUTINE X 2)
Culture: NO GROWTH
Culture: NO GROWTH
Special Requests: ADEQUATE
Special Requests: ADEQUATE

## 2019-12-08 ENCOUNTER — Other Ambulatory Visit: Payer: Self-pay | Admitting: Internal Medicine

## 2019-12-08 ENCOUNTER — Ambulatory Visit
Admission: RE | Admit: 2019-12-08 | Discharge: 2019-12-08 | Disposition: A | Discharge status: Home / Self Care | Payer: Medicare Other | Source: Ambulatory Visit | Attending: Internal Medicine | Admitting: Internal Medicine

## 2019-12-08 DIAGNOSIS — J690 Pneumonitis due to inhalation of food and vomit: Secondary | ICD-10-CM

## 2020-01-01 ENCOUNTER — Other Ambulatory Visit: Payer: Self-pay | Admitting: Internal Medicine

## 2020-01-01 ENCOUNTER — Ambulatory Visit
Admission: RE | Admit: 2020-01-01 | Discharge: 2020-01-01 | Disposition: A | Discharge status: Home / Self Care | Payer: Medicare Other | Source: Ambulatory Visit | Attending: Internal Medicine | Admitting: Internal Medicine

## 2020-01-01 DIAGNOSIS — J690 Pneumonitis due to inhalation of food and vomit: Secondary | ICD-10-CM

## 2020-02-11 ENCOUNTER — Ambulatory Visit
Admission: RE | Admit: 2020-02-11 | Discharge: 2020-02-11 | Disposition: A | Discharge status: Home / Self Care | Payer: Medicare Other | Source: Ambulatory Visit | Attending: Internal Medicine | Admitting: Internal Medicine

## 2020-02-11 ENCOUNTER — Other Ambulatory Visit: Payer: Self-pay

## 2020-02-11 DIAGNOSIS — M810 Age-related osteoporosis without current pathological fracture: Secondary | ICD-10-CM

## 2020-02-15 ENCOUNTER — Ambulatory Visit (INDEPENDENT_AMBULATORY_CARE_PROVIDER_SITE_OTHER): Payer: Medicare Other | Admitting: Podiatry

## 2020-02-15 ENCOUNTER — Other Ambulatory Visit: Payer: Self-pay

## 2020-02-15 ENCOUNTER — Encounter: Payer: Self-pay | Admitting: Podiatry

## 2020-02-15 DIAGNOSIS — B351 Tinea unguium: Secondary | ICD-10-CM

## 2020-02-15 DIAGNOSIS — M79676 Pain in unspecified toe(s): Secondary | ICD-10-CM | POA: Diagnosis not present

## 2020-02-15 DIAGNOSIS — E1142 Type 2 diabetes mellitus with diabetic polyneuropathy: Secondary | ICD-10-CM

## 2020-02-15 DIAGNOSIS — Q828 Other specified congenital malformations of skin: Secondary | ICD-10-CM

## 2020-02-15 NOTE — Progress Notes (Signed)
This patient returns to the office for evaluation and treatment of long thick painful nails .  This patient is unable to trim his own nails since the patient cannot reach his feet.  Patient says the nails are painful walking and wearing his shoes.   Painful callus under left forefoot. He returns for preventive foot care services.  General Appearance  Alert, conversant and in no acute stress.  Vascular  Dorsalis pedis and posterior tibial  pulses are palpable  bilaterally.  Capillary return is within normal limits  bilaterally. Temperature is within normal limits  bilaterally.  Neurologic  Senn-Weinstein monofilament wire test within normal limits  bilaterally. Muscle power within normal limits bilaterally.  Nails Thick disfigured discolored nails with subungual debris  from hallux to fifth toes bilaterally. No evidence of bacterial infection or drainage bilaterally.  Orthopedic  No limitations of motion  feet .  No crepitus or effusions noted.  No bony pathology or digital deformities noted.  Skin  normotropic skin with no porokeratosis noted bilaterally.  No signs of infections or ulcers noted.   Porokeratosis sub 4 left foot.  Onychomycosis  Pain in toes right foot  Pain in toes left foot  Porokeratosis sub 4 left foot.  Debridement  of nails  1-5  B/L with a nail nipper.  Nails were then filed using a dremel tool with no incidents. Debride callus with # 15 blade.   RTC  4 months    Darryl Jones DPM

## 2020-03-03 ENCOUNTER — Other Ambulatory Visit: Payer: Self-pay | Admitting: Internal Medicine

## 2020-03-03 DIAGNOSIS — M542 Cervicalgia: Secondary | ICD-10-CM

## 2020-03-23 ENCOUNTER — Ambulatory Visit
Admission: RE | Admit: 2020-03-23 | Discharge: 2020-03-23 | Disposition: A | Discharge status: Home / Self Care | Payer: Medicare Other | Source: Ambulatory Visit | Attending: Internal Medicine | Admitting: Internal Medicine

## 2020-03-23 DIAGNOSIS — M542 Cervicalgia: Secondary | ICD-10-CM | POA: Diagnosis not present

## 2020-03-30 DIAGNOSIS — H9201 Otalgia, right ear: Secondary | ICD-10-CM | POA: Diagnosis not present

## 2020-03-30 DIAGNOSIS — E1151 Type 2 diabetes mellitus with diabetic peripheral angiopathy without gangrene: Secondary | ICD-10-CM | POA: Diagnosis not present

## 2020-03-30 DIAGNOSIS — J449 Chronic obstructive pulmonary disease, unspecified: Secondary | ICD-10-CM | POA: Diagnosis not present

## 2020-03-30 DIAGNOSIS — E1142 Type 2 diabetes mellitus with diabetic polyneuropathy: Secondary | ICD-10-CM | POA: Diagnosis not present

## 2020-03-31 DIAGNOSIS — M8580 Other specified disorders of bone density and structure, unspecified site: Secondary | ICD-10-CM | POA: Diagnosis not present

## 2020-03-31 DIAGNOSIS — M069 Rheumatoid arthritis, unspecified: Secondary | ICD-10-CM | POA: Diagnosis not present

## 2020-03-31 DIAGNOSIS — M5459 Other low back pain: Secondary | ICD-10-CM | POA: Diagnosis not present

## 2020-03-31 DIAGNOSIS — M1990 Unspecified osteoarthritis, unspecified site: Secondary | ICD-10-CM | POA: Diagnosis not present

## 2020-03-31 DIAGNOSIS — Z79899 Other long term (current) drug therapy: Secondary | ICD-10-CM | POA: Diagnosis not present

## 2020-03-31 DIAGNOSIS — I2510 Atherosclerotic heart disease of native coronary artery without angina pectoris: Secondary | ICD-10-CM | POA: Diagnosis not present

## 2020-04-12 DIAGNOSIS — E119 Type 2 diabetes mellitus without complications: Secondary | ICD-10-CM | POA: Diagnosis not present

## 2020-04-12 DIAGNOSIS — I480 Paroxysmal atrial fibrillation: Secondary | ICD-10-CM | POA: Diagnosis not present

## 2020-04-12 DIAGNOSIS — Z23 Encounter for immunization: Secondary | ICD-10-CM | POA: Diagnosis not present

## 2020-04-12 DIAGNOSIS — I442 Atrioventricular block, complete: Secondary | ICD-10-CM | POA: Diagnosis not present

## 2020-04-12 DIAGNOSIS — E785 Hyperlipidemia, unspecified: Secondary | ICD-10-CM | POA: Diagnosis not present

## 2020-04-12 DIAGNOSIS — Z950 Presence of cardiac pacemaker: Secondary | ICD-10-CM | POA: Diagnosis not present

## 2020-04-21 DIAGNOSIS — K219 Gastro-esophageal reflux disease without esophagitis: Secondary | ICD-10-CM | POA: Diagnosis not present

## 2020-04-21 DIAGNOSIS — E1151 Type 2 diabetes mellitus with diabetic peripheral angiopathy without gangrene: Secondary | ICD-10-CM | POA: Diagnosis not present

## 2020-04-21 DIAGNOSIS — H409 Unspecified glaucoma: Secondary | ICD-10-CM | POA: Diagnosis not present

## 2020-04-21 DIAGNOSIS — M0579 Rheumatoid arthritis with rheumatoid factor of multiple sites without organ or systems involvement: Secondary | ICD-10-CM | POA: Diagnosis not present

## 2020-04-21 DIAGNOSIS — E11621 Type 2 diabetes mellitus with foot ulcer: Secondary | ICD-10-CM | POA: Diagnosis not present

## 2020-04-21 DIAGNOSIS — E782 Mixed hyperlipidemia: Secondary | ICD-10-CM | POA: Diagnosis not present

## 2020-04-21 DIAGNOSIS — E1142 Type 2 diabetes mellitus with diabetic polyneuropathy: Secondary | ICD-10-CM | POA: Diagnosis not present

## 2020-04-21 DIAGNOSIS — M069 Rheumatoid arthritis, unspecified: Secondary | ICD-10-CM | POA: Diagnosis not present

## 2020-04-21 DIAGNOSIS — J449 Chronic obstructive pulmonary disease, unspecified: Secondary | ICD-10-CM | POA: Diagnosis not present

## 2020-04-21 DIAGNOSIS — M810 Age-related osteoporosis without current pathological fracture: Secondary | ICD-10-CM | POA: Diagnosis not present

## 2020-04-26 DIAGNOSIS — I442 Atrioventricular block, complete: Secondary | ICD-10-CM | POA: Diagnosis not present

## 2020-04-26 DIAGNOSIS — E785 Hyperlipidemia, unspecified: Secondary | ICD-10-CM | POA: Diagnosis not present

## 2020-04-26 DIAGNOSIS — I480 Paroxysmal atrial fibrillation: Secondary | ICD-10-CM | POA: Diagnosis not present

## 2020-04-26 DIAGNOSIS — E119 Type 2 diabetes mellitus without complications: Secondary | ICD-10-CM | POA: Diagnosis not present

## 2020-04-26 DIAGNOSIS — Z23 Encounter for immunization: Secondary | ICD-10-CM | POA: Diagnosis not present

## 2020-04-26 DIAGNOSIS — Z950 Presence of cardiac pacemaker: Secondary | ICD-10-CM | POA: Diagnosis not present

## 2020-04-26 DIAGNOSIS — R002 Palpitations: Secondary | ICD-10-CM | POA: Diagnosis not present

## 2020-05-06 DIAGNOSIS — U071 COVID-19: Secondary | ICD-10-CM | POA: Diagnosis not present

## 2020-05-07 ENCOUNTER — Other Ambulatory Visit: Payer: Self-pay | Admitting: Adult Health

## 2020-05-07 NOTE — Progress Notes (Signed)
I connected by phone with Darryl Jones on 05/07/2020 at 12:14 PM to discuss the potential use of a new treatment for mild to moderate COVID-19 viral infection in non-hospitalized patients.  This patient is a 65 y.o. male that meets the FDA criteria for Emergency Use Authorization of COVID monoclonal antibody sotrovimab.  Has a (+) direct SARS-CoV-2 viral test result  Has mild or moderate COVID-19   Is NOT hospitalized due to COVID-19  Is within 10 days of symptom onset  Has at least one of the high risk factor(s) for progression to severe COVID-19 and/or hospitalization as defined in EUA.  Specific high risk criteria : Immunosuppressive Disease or Treatment   I have spoken and communicated the following to the patient or parent/caregiver regarding COVID monoclonal antibody treatment:  1. FDA has authorized the emergency use for the treatment of mild to moderate COVID-19 in adults and pediatric patients with positive results of direct SARS-CoV-2 viral testing who are 58 years of age and older weighing at least 40 kg, and who are at high risk for progressing to severe COVID-19 and/or hospitalization.  2. The significant known and potential risks and benefits of COVID monoclonal antibody, and the extent to which such potential risks and benefits are unknown.  3. Information on available alternative treatments and the risks and benefits of those alternatives, including clinical trials.  4. Patients treated with COVID monoclonal antibody should continue to self-isolate and use infection control measures (e.g., wear mask, isolate, social distance, avoid sharing personal items, clean and disinfect "high touch" surfaces, and frequent handwashing) according to CDC guidelines.   5. The patient or parent/caregiver has the option to accept or refuse COVID monoclonal antibody treatment.  After reviewing this information with the patient, the patient has agreed to receive one of the available covid  19 monoclonal antibodies and will be provided an appropriate fact sheet prior to infusion.  Sx onset 2/24, Covid test 2/25 + , Several RF , Vaccinated x 2 only .  Has reaction to Humira in past with severe infection/pna, discussed with covid team felt ok to proceed. Anaphylaxis listed for claritin and hydroxchlorquinoline -had trouble breathing with these meds. Advised will monitor for 1 hr post infusion .   Rexene Edison, NP 05/07/2020 12:14 PM

## 2020-05-09 ENCOUNTER — Ambulatory Visit (HOSPITAL_COMMUNITY)
Admission: RE | Admit: 2020-05-09 | Discharge: 2020-05-09 | Disposition: A | Discharge status: Home / Self Care | Payer: Medicare Other | Source: Ambulatory Visit | Attending: Pulmonary Disease | Admitting: Pulmonary Disease

## 2020-05-09 DIAGNOSIS — U071 COVID-19: Secondary | ICD-10-CM | POA: Diagnosis not present

## 2020-05-09 DIAGNOSIS — D849 Immunodeficiency, unspecified: Secondary | ICD-10-CM | POA: Diagnosis not present

## 2020-05-09 DIAGNOSIS — R54 Age-related physical debility: Secondary | ICD-10-CM | POA: Insufficient documentation

## 2020-05-09 MED ORDER — ALBUTEROL SULFATE HFA 108 (90 BASE) MCG/ACT IN AERS: 2.0000 | INHALATION_SPRAY | Freq: Once | RESPIRATORY_TRACT | Status: DC | PRN

## 2020-05-09 MED ORDER — FAMOTIDINE IN NACL 20-0.9 MG/50ML-% IV SOLN: 20.0000 mg | Freq: Once | INTRAVENOUS | Status: DC | PRN

## 2020-05-09 MED ORDER — METHYLPREDNISOLONE SODIUM SUCC 125 MG IJ SOLR: 125.0000 mg | Freq: Once | INTRAMUSCULAR | Status: DC | PRN

## 2020-05-09 MED ORDER — SODIUM CHLORIDE 0.9 % IV SOLN: INTRAVENOUS | Status: DC | PRN

## 2020-05-09 MED ORDER — DIPHENHYDRAMINE HCL 50 MG/ML IJ SOLN: 50.0000 mg | Freq: Once | INTRAMUSCULAR | Status: DC | PRN

## 2020-05-09 MED ORDER — SOTROVIMAB 500 MG/8ML IV SOLN
500.0000 mg | Freq: Once | INTRAVENOUS | Status: AC
  Administered 2020-05-09: 500 mg via INTRAVENOUS

## 2020-05-09 MED ORDER — EPINEPHRINE 0.3 MG/0.3ML IJ SOAJ: 0.3000 mg | Freq: Once | INTRAMUSCULAR | Status: DC | PRN

## 2020-05-09 NOTE — Progress Notes (Signed)
Patient reviewed Fact Sheet for Patients, Parents, and Caregivers for Emergency Use Authorization (EUA) of sotrovimab for the Treatment of Coronavirus. Patient also reviewed and is agreeable to the estimated cost of treatment. Patient is agreeable to proceed.   

## 2020-05-09 NOTE — Discharge Instructions (Signed)

## 2020-05-09 NOTE — Progress Notes (Signed)
Diagnosis: COVID-19  Physician: Dr. Patrick Wright  Procedure: [facility] Med: Sotrovimab infusion - Provided patient with sotrovimab fact sheet for patients, parents, and caregivers prior to infusion.   Complications: No immediate complications noted  Discharge: Discharged home    

## 2020-05-16 ENCOUNTER — Ambulatory Visit: Payer: Medicare Other | Admitting: Podiatry

## 2020-05-24 DIAGNOSIS — M069 Rheumatoid arthritis, unspecified: Secondary | ICD-10-CM | POA: Diagnosis not present

## 2020-05-24 DIAGNOSIS — K219 Gastro-esophageal reflux disease without esophagitis: Secondary | ICD-10-CM | POA: Diagnosis not present

## 2020-05-24 DIAGNOSIS — E1151 Type 2 diabetes mellitus with diabetic peripheral angiopathy without gangrene: Secondary | ICD-10-CM | POA: Diagnosis not present

## 2020-05-24 DIAGNOSIS — E11621 Type 2 diabetes mellitus with foot ulcer: Secondary | ICD-10-CM | POA: Diagnosis not present

## 2020-05-24 DIAGNOSIS — E1142 Type 2 diabetes mellitus with diabetic polyneuropathy: Secondary | ICD-10-CM | POA: Diagnosis not present

## 2020-05-24 DIAGNOSIS — M0579 Rheumatoid arthritis with rheumatoid factor of multiple sites without organ or systems involvement: Secondary | ICD-10-CM | POA: Diagnosis not present

## 2020-05-24 DIAGNOSIS — H409 Unspecified glaucoma: Secondary | ICD-10-CM | POA: Diagnosis not present

## 2020-05-24 DIAGNOSIS — J449 Chronic obstructive pulmonary disease, unspecified: Secondary | ICD-10-CM | POA: Diagnosis not present

## 2020-05-24 DIAGNOSIS — E782 Mixed hyperlipidemia: Secondary | ICD-10-CM | POA: Diagnosis not present

## 2020-05-24 DIAGNOSIS — M810 Age-related osteoporosis without current pathological fracture: Secondary | ICD-10-CM | POA: Diagnosis not present

## 2020-06-02 ENCOUNTER — Other Ambulatory Visit: Payer: Self-pay

## 2020-06-02 ENCOUNTER — Encounter: Payer: Self-pay | Admitting: Podiatry

## 2020-06-02 ENCOUNTER — Ambulatory Visit: Payer: Medicare Other | Admitting: Podiatry

## 2020-06-02 DIAGNOSIS — B351 Tinea unguium: Secondary | ICD-10-CM

## 2020-06-02 DIAGNOSIS — E1142 Type 2 diabetes mellitus with diabetic polyneuropathy: Secondary | ICD-10-CM

## 2020-06-02 DIAGNOSIS — M79676 Pain in unspecified toe(s): Secondary | ICD-10-CM | POA: Diagnosis not present

## 2020-06-02 DIAGNOSIS — Q828 Other specified congenital malformations of skin: Secondary | ICD-10-CM

## 2020-06-02 NOTE — Progress Notes (Signed)
This patient returns to the office for evaluation and treatment of long thick painful nails .  This patient is unable to trim his own nails since the patient cannot reach his feet.  Patient says the nails are painful walking and wearing his shoes.   Painful callus under left forefoot.  Patient says he stubbed his big toenail left foot. He returns for preventive foot care services.  General Appearance  Alert, conversant and in no acute stress.  Vascular  Dorsalis pedis and posterior tibial  pulses are palpable  bilaterally.  Capillary return is within normal limits  bilaterally. Temperature is within normal limits  bilaterally.  Neurologic  Senn-Weinstein monofilament wire test within normal limits  bilaterally. Muscle power within normal limits bilaterally.  Nails Thick disfigured discolored nails with subungual debris  from hallux to fifth toes bilaterally. No evidence of bacterial infection or drainage bilaterally.  Orthopedic  No limitations of motion  feet .  No crepitus or effusions noted.  No bony pathology or digital deformities noted.  Skin  normotropic skin with no porokeratosis noted bilaterally.  No signs of infections or ulcers noted.   Porokeratosis sub 4 left foot.  Onychomycosis  Pain in toes right foot  Pain in toes left foot  Porokeratosis sub 4 left foot.  Debridement  of nails  1-5  B/L with a nail nipper.  Nails were then filed using a dremel tool with no incidents. Debride callus with # 15 blade.   RTC  4 months    Gardiner Barefoot DPM

## 2020-07-01 DIAGNOSIS — M810 Age-related osteoporosis without current pathological fracture: Secondary | ICD-10-CM | POA: Diagnosis not present

## 2020-07-01 DIAGNOSIS — E782 Mixed hyperlipidemia: Secondary | ICD-10-CM | POA: Diagnosis not present

## 2020-07-01 DIAGNOSIS — K219 Gastro-esophageal reflux disease without esophagitis: Secondary | ICD-10-CM | POA: Diagnosis not present

## 2020-07-01 DIAGNOSIS — M0579 Rheumatoid arthritis with rheumatoid factor of multiple sites without organ or systems involvement: Secondary | ICD-10-CM | POA: Diagnosis not present

## 2020-07-01 DIAGNOSIS — H409 Unspecified glaucoma: Secondary | ICD-10-CM | POA: Diagnosis not present

## 2020-07-01 DIAGNOSIS — E11621 Type 2 diabetes mellitus with foot ulcer: Secondary | ICD-10-CM | POA: Diagnosis not present

## 2020-07-01 DIAGNOSIS — J449 Chronic obstructive pulmonary disease, unspecified: Secondary | ICD-10-CM | POA: Diagnosis not present

## 2020-07-01 DIAGNOSIS — E1151 Type 2 diabetes mellitus with diabetic peripheral angiopathy without gangrene: Secondary | ICD-10-CM | POA: Diagnosis not present

## 2020-07-01 DIAGNOSIS — E1142 Type 2 diabetes mellitus with diabetic polyneuropathy: Secondary | ICD-10-CM | POA: Diagnosis not present

## 2020-07-19 DIAGNOSIS — I442 Atrioventricular block, complete: Secondary | ICD-10-CM | POA: Diagnosis not present

## 2020-09-08 DIAGNOSIS — E785 Hyperlipidemia, unspecified: Secondary | ICD-10-CM | POA: Diagnosis not present

## 2020-09-08 DIAGNOSIS — Z950 Presence of cardiac pacemaker: Secondary | ICD-10-CM | POA: Diagnosis not present

## 2020-09-08 DIAGNOSIS — I480 Paroxysmal atrial fibrillation: Secondary | ICD-10-CM | POA: Diagnosis not present

## 2020-09-08 DIAGNOSIS — I442 Atrioventricular block, complete: Secondary | ICD-10-CM | POA: Diagnosis not present

## 2020-09-22 DIAGNOSIS — M069 Rheumatoid arthritis, unspecified: Secondary | ICD-10-CM | POA: Diagnosis not present

## 2020-09-22 DIAGNOSIS — I700 Atherosclerosis of aorta: Secondary | ICD-10-CM | POA: Diagnosis not present

## 2020-09-22 DIAGNOSIS — G4733 Obstructive sleep apnea (adult) (pediatric): Secondary | ICD-10-CM | POA: Diagnosis not present

## 2020-09-22 DIAGNOSIS — Z1389 Encounter for screening for other disorder: Secondary | ICD-10-CM | POA: Diagnosis not present

## 2020-09-22 DIAGNOSIS — E119 Type 2 diabetes mellitus without complications: Secondary | ICD-10-CM | POA: Diagnosis not present

## 2020-09-22 DIAGNOSIS — Z0000 Encounter for general adult medical examination without abnormal findings: Secondary | ICD-10-CM | POA: Diagnosis not present

## 2020-09-22 DIAGNOSIS — E1142 Type 2 diabetes mellitus with diabetic polyneuropathy: Secondary | ICD-10-CM | POA: Diagnosis not present

## 2020-09-22 DIAGNOSIS — H2513 Age-related nuclear cataract, bilateral: Secondary | ICD-10-CM | POA: Diagnosis not present

## 2020-09-22 DIAGNOSIS — Z7984 Long term (current) use of oral hypoglycemic drugs: Secondary | ICD-10-CM | POA: Diagnosis not present

## 2020-09-22 DIAGNOSIS — M810 Age-related osteoporosis without current pathological fracture: Secondary | ICD-10-CM | POA: Diagnosis not present

## 2020-09-22 DIAGNOSIS — E782 Mixed hyperlipidemia: Secondary | ICD-10-CM | POA: Diagnosis not present

## 2020-09-22 DIAGNOSIS — Z79899 Other long term (current) drug therapy: Secondary | ICD-10-CM | POA: Diagnosis not present

## 2020-09-22 DIAGNOSIS — E1151 Type 2 diabetes mellitus with diabetic peripheral angiopathy without gangrene: Secondary | ICD-10-CM | POA: Diagnosis not present

## 2020-09-22 DIAGNOSIS — J449 Chronic obstructive pulmonary disease, unspecified: Secondary | ICD-10-CM | POA: Diagnosis not present

## 2020-09-22 DIAGNOSIS — H40053 Ocular hypertension, bilateral: Secondary | ICD-10-CM | POA: Diagnosis not present

## 2020-09-26 DIAGNOSIS — M5459 Other low back pain: Secondary | ICD-10-CM | POA: Diagnosis not present

## 2020-09-26 DIAGNOSIS — M069 Rheumatoid arthritis, unspecified: Secondary | ICD-10-CM | POA: Diagnosis not present

## 2020-09-26 DIAGNOSIS — Z79899 Other long term (current) drug therapy: Secondary | ICD-10-CM | POA: Diagnosis not present

## 2020-09-26 DIAGNOSIS — I2510 Atherosclerotic heart disease of native coronary artery without angina pectoris: Secondary | ICD-10-CM | POA: Diagnosis not present

## 2020-09-26 DIAGNOSIS — M1990 Unspecified osteoarthritis, unspecified site: Secondary | ICD-10-CM | POA: Diagnosis not present

## 2020-09-26 DIAGNOSIS — M8580 Other specified disorders of bone density and structure, unspecified site: Secondary | ICD-10-CM | POA: Diagnosis not present

## 2020-09-29 DIAGNOSIS — H409 Unspecified glaucoma: Secondary | ICD-10-CM | POA: Diagnosis not present

## 2020-09-29 DIAGNOSIS — K219 Gastro-esophageal reflux disease without esophagitis: Secondary | ICD-10-CM | POA: Diagnosis not present

## 2020-09-29 DIAGNOSIS — M069 Rheumatoid arthritis, unspecified: Secondary | ICD-10-CM | POA: Diagnosis not present

## 2020-09-29 DIAGNOSIS — M0579 Rheumatoid arthritis with rheumatoid factor of multiple sites without organ or systems involvement: Secondary | ICD-10-CM | POA: Diagnosis not present

## 2020-09-29 DIAGNOSIS — E1151 Type 2 diabetes mellitus with diabetic peripheral angiopathy without gangrene: Secondary | ICD-10-CM | POA: Diagnosis not present

## 2020-09-29 DIAGNOSIS — E782 Mixed hyperlipidemia: Secondary | ICD-10-CM | POA: Diagnosis not present

## 2020-09-29 DIAGNOSIS — J449 Chronic obstructive pulmonary disease, unspecified: Secondary | ICD-10-CM | POA: Diagnosis not present

## 2020-09-29 DIAGNOSIS — E1142 Type 2 diabetes mellitus with diabetic polyneuropathy: Secondary | ICD-10-CM | POA: Diagnosis not present

## 2020-09-29 DIAGNOSIS — M810 Age-related osteoporosis without current pathological fracture: Secondary | ICD-10-CM | POA: Diagnosis not present

## 2020-10-03 ENCOUNTER — Ambulatory Visit: Payer: Medicare Other | Admitting: Podiatry

## 2020-10-24 ENCOUNTER — Encounter: Payer: Self-pay | Admitting: Podiatry

## 2020-10-24 ENCOUNTER — Ambulatory Visit: Payer: Medicare Other | Admitting: Podiatry

## 2020-10-24 ENCOUNTER — Other Ambulatory Visit: Payer: Self-pay

## 2020-10-24 DIAGNOSIS — E1142 Type 2 diabetes mellitus with diabetic polyneuropathy: Secondary | ICD-10-CM | POA: Diagnosis not present

## 2020-10-24 DIAGNOSIS — M79676 Pain in unspecified toe(s): Secondary | ICD-10-CM

## 2020-10-24 DIAGNOSIS — B351 Tinea unguium: Secondary | ICD-10-CM | POA: Diagnosis not present

## 2020-10-24 DIAGNOSIS — Q828 Other specified congenital malformations of skin: Secondary | ICD-10-CM

## 2020-10-24 NOTE — Progress Notes (Signed)
This patient returns to the office for evaluation and treatment of long thick painful nails .  This patient is unable to trim his own nails since the patient cannot reach his feet.  Patient says the nails are painful walking and wearing his shoes.   Painful callus under left forefoot.  Patient says he stubbed his big toenail left foot. He returns for preventive foot care services.  General Appearance  Alert, conversant and in no acute stress.  Vascular  Dorsalis pedis and posterior tibial  pulses are palpable  bilaterally.  Capillary return is within normal limits  bilaterally. Temperature is within normal limits  bilaterally.  Neurologic  Senn-Weinstein monofilament wire test within normal limits  bilaterally. Muscle power within normal limits bilaterally.  Nails Thick disfigured discolored nails with subungual debris  from hallux to fifth toes bilaterally. No evidence of bacterial infection or drainage bilaterally.  Orthopedic  No limitations of motion  feet .  No crepitus or effusions noted.  No bony pathology or digital deformities noted.  Skin  normotropic skin with no porokeratosis noted bilaterally.  No signs of infections or ulcers noted.   Porokeratosis sub 4 left foot.  Onychomycosis  Pain in toes right foot  Pain in toes left foot  Porokeratosis sub 4 left foot.  Debridement  of nails  1-5  B/L with a nail nipper.  Nails were then filed using a dremel tool with no incidents. Debride callus with # 15 blade.   RTC  4 months    Gardiner Barefoot DPM

## 2020-10-30 ENCOUNTER — Emergency Department
Admission: EM | Admit: 2020-10-30 | Discharge: 2020-10-30 | Disposition: A | Discharge status: Home / Self Care | Payer: Medicare Other | Attending: Emergency Medicine | Admitting: Emergency Medicine

## 2020-10-30 ENCOUNTER — Other Ambulatory Visit: Payer: Self-pay

## 2020-10-30 ENCOUNTER — Emergency Department: Payer: Medicare Other

## 2020-10-30 DIAGNOSIS — R000 Tachycardia, unspecified: Secondary | ICD-10-CM | POA: Diagnosis not present

## 2020-10-30 DIAGNOSIS — I10 Essential (primary) hypertension: Secondary | ICD-10-CM

## 2020-10-30 DIAGNOSIS — R079 Chest pain, unspecified: Secondary | ICD-10-CM | POA: Diagnosis not present

## 2020-10-30 DIAGNOSIS — R059 Cough, unspecified: Secondary | ICD-10-CM | POA: Diagnosis not present

## 2020-10-30 DIAGNOSIS — E1165 Type 2 diabetes mellitus with hyperglycemia: Secondary | ICD-10-CM | POA: Diagnosis not present

## 2020-10-30 DIAGNOSIS — R0789 Other chest pain: Secondary | ICD-10-CM | POA: Diagnosis not present

## 2020-10-30 DIAGNOSIS — Z85828 Personal history of other malignant neoplasm of skin: Secondary | ICD-10-CM | POA: Diagnosis not present

## 2020-10-30 DIAGNOSIS — Z87891 Personal history of nicotine dependence: Secondary | ICD-10-CM | POA: Diagnosis not present

## 2020-10-30 DIAGNOSIS — Z7951 Long term (current) use of inhaled steroids: Secondary | ICD-10-CM | POA: Insufficient documentation

## 2020-10-30 DIAGNOSIS — Z7982 Long term (current) use of aspirin: Secondary | ICD-10-CM | POA: Diagnosis not present

## 2020-10-30 DIAGNOSIS — Z950 Presence of cardiac pacemaker: Secondary | ICD-10-CM | POA: Diagnosis not present

## 2020-10-30 DIAGNOSIS — R739 Hyperglycemia, unspecified: Secondary | ICD-10-CM

## 2020-10-30 DIAGNOSIS — J449 Chronic obstructive pulmonary disease, unspecified: Secondary | ICD-10-CM | POA: Diagnosis not present

## 2020-10-30 LAB — BASIC METABOLIC PANEL
Anion gap: 10 (ref 5–15)
BUN: 17 mg/dL (ref 8–23)
CO2: 26 mmol/L (ref 22–32)
Calcium: 9 mg/dL (ref 8.9–10.3)
Chloride: 102 mmol/L (ref 98–111)
Creatinine, Ser: 1.05 mg/dL (ref 0.61–1.24)
GFR, Estimated: >60 mL/min (ref >60)
Glucose, Bld: 186 mg/dL — ABNORMAL HIGH (ref 70–99)
Potassium: 4.6 mmol/L (ref 3.5–5.1)
Sodium: 138 mmol/L (ref 135–145)

## 2020-10-30 LAB — CBC
HCT: 45.9 % (ref 39.0–52.0)
Hemoglobin: 15.6 g/dL (ref 13.0–17.0)
MCH: 30 pg (ref 26.0–34.0)
MCHC: 34 g/dL (ref 30.0–36.0)
MCV: 88.3 fL (ref 80.0–100.0)
Platelets: 217 10*3/uL (ref 150–400)
RBC: 5.2 MIL/uL (ref 4.22–5.81)
RDW: 13.7 % (ref 11.5–15.5)
WBC: 8.4 10*3/uL (ref 4.0–10.5)
nRBC: 0 % (ref 0.0–0.2)

## 2020-10-30 LAB — D-DIMER, QUANTITATIVE: D-Dimer, Quant: 0.5 ug/mL-FEU (ref 0.00–0.50)

## 2020-10-30 LAB — TROPONIN I (HIGH SENSITIVITY)
Troponin I (High Sensitivity): 8 ng/L (ref <18)
Troponin I (High Sensitivity): 6 ng/L (ref <18)

## 2020-10-30 MED ORDER — LACTATED RINGERS IV BOLUS: 1000.0000 mL | Freq: Once | INTRAVENOUS | Status: DC

## 2020-10-30 MED ORDER — ACETAMINOPHEN 500 MG PO TABS
1000.0000 mg | ORAL_TABLET | Freq: Once | ORAL | Status: DC
  Filled 2020-10-30: qty 2
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
## 2020-10-30 NOTE — ED Triage Notes (Signed)
Pt here with wife who states that he has been complaining on L sided chest soreness that gets worse with coughing or when he takes a deep breath- pt has a pacemaker on that side of his chest- pain radiates from chest into shoulder- states pain started this AM

## 2020-10-30 NOTE — ED Notes (Signed)
See triage note  Presents with left side chest pain for 1-2 days   Pain increases with cough and inspiration  No fever on arrival

## 2020-12-28 DIAGNOSIS — I442 Atrioventricular block, complete: Secondary | ICD-10-CM | POA: Diagnosis not present

## 2021-01-02 DIAGNOSIS — Z950 Presence of cardiac pacemaker: Secondary | ICD-10-CM | POA: Diagnosis not present

## 2021-01-02 DIAGNOSIS — I480 Paroxysmal atrial fibrillation: Secondary | ICD-10-CM | POA: Diagnosis not present

## 2021-01-02 DIAGNOSIS — E785 Hyperlipidemia, unspecified: Secondary | ICD-10-CM | POA: Diagnosis not present

## 2021-01-02 DIAGNOSIS — Z23 Encounter for immunization: Secondary | ICD-10-CM | POA: Diagnosis not present

## 2021-01-18 DIAGNOSIS — E11621 Type 2 diabetes mellitus with foot ulcer: Secondary | ICD-10-CM | POA: Diagnosis not present

## 2021-01-18 DIAGNOSIS — E1142 Type 2 diabetes mellitus with diabetic polyneuropathy: Secondary | ICD-10-CM | POA: Diagnosis not present

## 2021-01-18 DIAGNOSIS — E1151 Type 2 diabetes mellitus with diabetic peripheral angiopathy without gangrene: Secondary | ICD-10-CM | POA: Diagnosis not present

## 2021-01-18 DIAGNOSIS — J449 Chronic obstructive pulmonary disease, unspecified: Secondary | ICD-10-CM | POA: Diagnosis not present

## 2021-01-18 DIAGNOSIS — Z23 Encounter for immunization: Secondary | ICD-10-CM | POA: Diagnosis not present

## 2021-01-18 DIAGNOSIS — M069 Rheumatoid arthritis, unspecified: Secondary | ICD-10-CM | POA: Diagnosis not present

## 2021-01-31 DIAGNOSIS — Z950 Presence of cardiac pacemaker: Secondary | ICD-10-CM | POA: Diagnosis not present

## 2021-01-31 DIAGNOSIS — I480 Paroxysmal atrial fibrillation: Secondary | ICD-10-CM | POA: Diagnosis not present

## 2021-02-16 ENCOUNTER — Ambulatory Visit: Payer: Medicare Other | Admitting: Podiatry

## 2021-02-16 ENCOUNTER — Other Ambulatory Visit: Payer: Self-pay

## 2021-02-16 DIAGNOSIS — E1142 Type 2 diabetes mellitus with diabetic polyneuropathy: Secondary | ICD-10-CM

## 2021-02-16 DIAGNOSIS — L84 Corns and callosities: Secondary | ICD-10-CM | POA: Diagnosis not present

## 2021-02-16 NOTE — Progress Notes (Signed)
Subjective:  Patient ID: ORA MCNATT, male    DOB: 04/28/1955,  MRN: [id number]  Chief Complaint  Patient presents with   Callouses    Left foot place between 4th and 5th toe     65 y.o. male presents with the above complaint.  Patient presents with complaint of left fourth/fifth digit heloma molle.  Patient states that he was starting become a little bit dark and discolored.  Patient is here with his wife today wanting to make sure that there is nothing going on given that he is a diabetic.  His A1c is around 8%.  He states that he was little bit red and swollen 2 days ago it is looking little bit better.  He soaked it in Epsom salt which seems to help with the pain.  He would like to discuss treatment options he does wear offloading pads.  He is on Eliquis.   Review of Systems: Negative except as noted in the HPI. Denies N/V/F/Ch.  Past Medical History:  Diagnosis Date   Arthritis    Hx: o f rheumatoid   Cancer ([place])    Hx; of Basal cell skin cancers of the lip and nose   COPD (chronic obstructive pulmonary disease) ([place])    Diabetes mellitus without complication (HCC)    Elevated liver enzymes    Hx: of "due to methotrexate"   Hearing loss    Hx: of deficit in both ears   Hyperlipidemia    Hx: of   Osteoporosis    Hx: of   Rosacea, acne    Hx: of   Shortness of breath     Current Outpatient Medications:    acetaminophen (TYLENOL) 650 MG CR tablet, Take 650-1,300 mg by mouth every 8 (eight) hours as needed for pain or fever., Disp: , Rfl:    Albuterol Sulfate (PROAIR RESPICLICK) 096 (90 Base) MCG/ACT AEPB, Inhale 2 puffs into the lungs every 6 (six) hours as needed (respiratory difficulties)., Disp: , Rfl:    aspirin EC 81 MG tablet, Take 81 mg by mouth daily. , Disp: , Rfl:    atorvastatin (LIPITOR) 20 MG tablet, Take 20 mg by mouth daily., Disp: , Rfl:    cholecalciferol (VITAMIN D3) 25 MCG (1000 UNIT) tablet, Take 1,000 Units by mouth daily., Disp: , Rfl:     latanoprost (XALATAN) 0.005 % ophthalmic solution, Place 1 drop into both eyes at bedtime. , Disp: , Rfl:    STIOLTO RESPIMAT 2.5-2.5 MCG/ACT AERS, Inhale 2 puffs into the lungs daily., Disp: , Rfl:    Tofacitinib Citrate (XELJANZ) 5 MG TABS, Take 1 tablet (5 mg total) by mouth 2 (two) times daily., Disp: , Rfl:   Social History   Tobacco Use  Smoking Status Former   Types: Cigarettes   Quit date: 06/08/2010   Years since quitting: 10.7  Smokeless Tobacco Never  Tobacco Comments   "quit smoking cigarettes in 2012"    Allergies  Allergen Reactions   Hydroxyquinolines Anaphylaxis   Loratadine Anaphylaxis and Other (See Comments)   Other Anaphylaxis and Other (See Comments)   Adalimumab Other (See Comments)   Leflunomide Other (See Comments)   Methotrexate Other (See Comments)   Methotrexate Derivatives Other (See Comments)    Elevated lft's   Aspirin Rash and Other (See Comments)   Protonix [Pantoprazole Sodium] Nausea Only   Objective:  There were no vitals filed for this visit. There is no height or weight on file to calculate BMI. Constitutional Well developed. Well  nourished.  Vascular Dorsalis pedis pulses palpable bilaterally. Posterior tibial pulses palpable bilaterally. Capillary refill normal to all digits.  No cyanosis or clubbing noted. Pedal hair growth normal.  Neurologic Normal speech. Oriented to person, place, and time. Epicritic sensation to light touch grossly present bilaterally.  Dermatologic Left fourth and fifth interdigital space heloma molle.  Mild pain on palpation.  No underlying ulceration noted.  Dried blood noted underneath the hyperkeratotic lesion.  Underlying driving force is likely the hammertoe contracture of fifth and fourth digit rubbing against each other  Orthopedic: Normal joint ROM without pain or crepitus bilaterally. No visible deformities. No bony tenderness.   Radiographs: None Assessment:   1. Heloma molle   2. Diabetic  polyneuropathy associated with type 2 diabetes mellitus ([place])    Plan:  Patient was evaluated and treated and all questions answered.  Left fourth and fifth digit heloma molle without concern for ulceration on Eliquis -All questions and concerns were discussed with the patient in extensive detail -The lesions were debrided down to healthy striated tissue.  No signs of ulceration noted.  No bleeding noted.  Dried blister noted.  No clinical signs of infection noted. -Padding and shoe gear modification were discussed in extensive detail.  They both state understanding.  If it continues to get worse on more painful we will discuss treatment options to help with them. -Offloading pads were dispensed  No follow-ups on file.

## 2021-02-17 DIAGNOSIS — I5189 Other ill-defined heart diseases: Secondary | ICD-10-CM

## 2021-02-17 DIAGNOSIS — I480 Paroxysmal atrial fibrillation: Secondary | ICD-10-CM | POA: Diagnosis not present

## 2021-02-17 HISTORY — DX: Other ill-defined heart diseases: I51.89

## 2021-02-23 ENCOUNTER — Ambulatory Visit: Payer: Medicare Other | Admitting: Podiatry

## 2021-03-14 DIAGNOSIS — I480 Paroxysmal atrial fibrillation: Secondary | ICD-10-CM | POA: Diagnosis not present

## 2021-03-14 DIAGNOSIS — I519 Heart disease, unspecified: Secondary | ICD-10-CM | POA: Diagnosis not present

## 2021-03-14 DIAGNOSIS — Z950 Presence of cardiac pacemaker: Secondary | ICD-10-CM | POA: Diagnosis not present

## 2021-03-14 DIAGNOSIS — Z7901 Long term (current) use of anticoagulants: Secondary | ICD-10-CM | POA: Diagnosis not present

## 2021-03-14 DIAGNOSIS — R0609 Other forms of dyspnea: Secondary | ICD-10-CM | POA: Diagnosis not present

## 2021-03-14 DIAGNOSIS — E1151 Type 2 diabetes mellitus with diabetic peripheral angiopathy without gangrene: Secondary | ICD-10-CM | POA: Diagnosis not present

## 2021-03-14 DIAGNOSIS — J449 Chronic obstructive pulmonary disease, unspecified: Secondary | ICD-10-CM | POA: Diagnosis not present

## 2021-03-14 DIAGNOSIS — I442 Atrioventricular block, complete: Secondary | ICD-10-CM | POA: Diagnosis not present

## 2021-03-14 DIAGNOSIS — R002 Palpitations: Secondary | ICD-10-CM | POA: Diagnosis not present

## 2021-03-27 DIAGNOSIS — I5189 Other ill-defined heart diseases: Secondary | ICD-10-CM | POA: Diagnosis not present

## 2021-03-27 DIAGNOSIS — I519 Heart disease, unspecified: Secondary | ICD-10-CM | POA: Diagnosis not present

## 2021-03-30 DIAGNOSIS — L570 Actinic keratosis: Secondary | ICD-10-CM | POA: Diagnosis not present

## 2021-03-30 DIAGNOSIS — D0461 Carcinoma in situ of skin of right upper limb, including shoulder: Secondary | ICD-10-CM | POA: Diagnosis not present

## 2021-03-30 DIAGNOSIS — L814 Other melanin hyperpigmentation: Secondary | ICD-10-CM | POA: Diagnosis not present

## 2021-03-30 DIAGNOSIS — D044 Carcinoma in situ of skin of scalp and neck: Secondary | ICD-10-CM | POA: Diagnosis not present

## 2021-03-30 DIAGNOSIS — Z85828 Personal history of other malignant neoplasm of skin: Secondary | ICD-10-CM | POA: Diagnosis not present

## 2021-03-30 DIAGNOSIS — L821 Other seborrheic keratosis: Secondary | ICD-10-CM | POA: Diagnosis not present

## 2021-04-03 DIAGNOSIS — M1990 Unspecified osteoarthritis, unspecified site: Secondary | ICD-10-CM | POA: Diagnosis not present

## 2021-04-03 DIAGNOSIS — M069 Rheumatoid arthritis, unspecified: Secondary | ICD-10-CM | POA: Diagnosis not present

## 2021-04-03 DIAGNOSIS — Z79899 Other long term (current) drug therapy: Secondary | ICD-10-CM | POA: Diagnosis not present

## 2021-04-03 DIAGNOSIS — M8580 Other specified disorders of bone density and structure, unspecified site: Secondary | ICD-10-CM | POA: Diagnosis not present

## 2021-04-03 DIAGNOSIS — I2510 Atherosclerotic heart disease of native coronary artery without angina pectoris: Secondary | ICD-10-CM | POA: Diagnosis not present

## 2021-04-03 DIAGNOSIS — M5459 Other low back pain: Secondary | ICD-10-CM | POA: Diagnosis not present

## 2021-04-06 DIAGNOSIS — R9439 Abnormal result of other cardiovascular function study: Secondary | ICD-10-CM | POA: Diagnosis not present

## 2021-04-06 DIAGNOSIS — R0609 Other forms of dyspnea: Secondary | ICD-10-CM | POA: Diagnosis not present

## 2021-04-06 DIAGNOSIS — E785 Hyperlipidemia, unspecified: Secondary | ICD-10-CM | POA: Diagnosis not present

## 2021-04-06 DIAGNOSIS — Z23 Encounter for immunization: Secondary | ICD-10-CM | POA: Diagnosis not present

## 2021-04-06 DIAGNOSIS — I442 Atrioventricular block, complete: Secondary | ICD-10-CM | POA: Diagnosis not present

## 2021-04-06 DIAGNOSIS — I480 Paroxysmal atrial fibrillation: Secondary | ICD-10-CM | POA: Diagnosis not present

## 2021-04-06 DIAGNOSIS — R002 Palpitations: Secondary | ICD-10-CM | POA: Diagnosis not present

## 2021-05-16 DIAGNOSIS — I442 Atrioventricular block, complete: Secondary | ICD-10-CM | POA: Diagnosis not present

## 2021-05-16 DIAGNOSIS — I480 Paroxysmal atrial fibrillation: Secondary | ICD-10-CM | POA: Diagnosis not present

## 2021-05-16 DIAGNOSIS — Z950 Presence of cardiac pacemaker: Secondary | ICD-10-CM | POA: Diagnosis not present

## 2021-05-22 ENCOUNTER — Other Ambulatory Visit: Payer: Self-pay

## 2021-05-22 ENCOUNTER — Ambulatory Visit: Payer: Medicare Other | Admitting: Podiatry

## 2021-05-22 ENCOUNTER — Encounter: Payer: Self-pay | Admitting: Podiatry

## 2021-05-22 DIAGNOSIS — M810 Age-related osteoporosis without current pathological fracture: Secondary | ICD-10-CM | POA: Insufficient documentation

## 2021-05-22 DIAGNOSIS — B351 Tinea unguium: Secondary | ICD-10-CM | POA: Diagnosis not present

## 2021-05-22 DIAGNOSIS — M0510 Rheumatoid lung disease with rheumatoid arthritis of unspecified site: Secondary | ICD-10-CM | POA: Insufficient documentation

## 2021-05-22 DIAGNOSIS — E11621 Type 2 diabetes mellitus with foot ulcer: Secondary | ICD-10-CM | POA: Diagnosis not present

## 2021-05-22 DIAGNOSIS — L84 Corns and callosities: Secondary | ICD-10-CM | POA: Diagnosis not present

## 2021-05-22 DIAGNOSIS — G4733 Obstructive sleep apnea (adult) (pediatric): Secondary | ICD-10-CM | POA: Insufficient documentation

## 2021-05-22 DIAGNOSIS — C4491 Basal cell carcinoma of skin, unspecified: Secondary | ICD-10-CM | POA: Insufficient documentation

## 2021-05-22 DIAGNOSIS — L719 Rosacea, unspecified: Secondary | ICD-10-CM | POA: Insufficient documentation

## 2021-05-22 DIAGNOSIS — I700 Atherosclerosis of aorta: Secondary | ICD-10-CM | POA: Insufficient documentation

## 2021-05-22 DIAGNOSIS — R1313 Dysphagia, pharyngeal phase: Secondary | ICD-10-CM | POA: Insufficient documentation

## 2021-05-22 DIAGNOSIS — E1142 Type 2 diabetes mellitus with diabetic polyneuropathy: Secondary | ICD-10-CM | POA: Insufficient documentation

## 2021-05-22 DIAGNOSIS — M79676 Pain in unspecified toe(s): Secondary | ICD-10-CM | POA: Diagnosis not present

## 2021-05-22 DIAGNOSIS — H9190 Unspecified hearing loss, unspecified ear: Secondary | ICD-10-CM | POA: Insufficient documentation

## 2021-05-22 DIAGNOSIS — L97509 Non-pressure chronic ulcer of other part of unspecified foot with unspecified severity: Secondary | ICD-10-CM | POA: Insufficient documentation

## 2021-05-22 DIAGNOSIS — R7989 Other specified abnormal findings of blood chemistry: Secondary | ICD-10-CM | POA: Insufficient documentation

## 2021-05-22 DIAGNOSIS — K219 Gastro-esophageal reflux disease without esophagitis: Secondary | ICD-10-CM | POA: Insufficient documentation

## 2021-05-22 DIAGNOSIS — R0989 Other specified symptoms and signs involving the circulatory and respiratory systems: Secondary | ICD-10-CM | POA: Insufficient documentation

## 2021-05-22 DIAGNOSIS — Q828 Other specified congenital malformations of skin: Secondary | ICD-10-CM

## 2021-05-22 DIAGNOSIS — M069 Rheumatoid arthritis, unspecified: Secondary | ICD-10-CM | POA: Insufficient documentation

## 2021-05-22 DIAGNOSIS — H409 Unspecified glaucoma: Secondary | ICD-10-CM | POA: Insufficient documentation

## 2021-05-22 NOTE — Progress Notes (Signed)
This patient returns to the office for evaluation and treatment of long thick painful nails .  This patient is unable to trim his own nails since the patient cannot reach his feet.  Patient says the nails are painful walking and wearing his shoes.   Painful callus under left forefoot.   He also has corn fourth toe left foot. He returns for preventive foot care services. ? ?General Appearance  Alert, conversant and in no acute stress. ? ?Vascular  Dorsalis pedis and posterior tibial  pulses are palpable  bilaterally.  Capillary return is within normal limits  bilaterally. Temperature is within normal limits  bilaterally. ? ?Neurologic  Senn-Weinstein monofilament wire test within normal limits  bilaterally. Muscle power within normal limits bilaterally. ? ?Nails Thick disfigured discolored nails with subungual debris  from hallux to fifth toes bilaterally. No evidence of bacterial infection or drainage bilaterally. ? ?Orthopedic  No limitations of motion  feet .  No crepitus or effusions noted.  No bony pathology or digital deformities noted. ? ?Skin  normotropic skin with no porokeratosis noted bilaterally.  No signs of infections or ulcers noted.   Porokeratosis sub 4 left foot.  Corn 4th toe left foot. ? ?Onychomycosis  Pain in toes right foot  Pain in toes left foot  Porokeratosis sub 4 left foot. ? ?Debridement  of nails  1-5  B/L with a nail nipper.  Nails were then filed using a dremel tool with no incidents. Debride callus with # 15 blade.   RTC  3  months  ? ? ?Gardiner Barefoot DPM  ?

## 2021-05-25 DIAGNOSIS — E11621 Type 2 diabetes mellitus with foot ulcer: Secondary | ICD-10-CM | POA: Diagnosis not present

## 2021-05-25 DIAGNOSIS — E1142 Type 2 diabetes mellitus with diabetic polyneuropathy: Secondary | ICD-10-CM | POA: Diagnosis not present

## 2021-05-25 DIAGNOSIS — E1151 Type 2 diabetes mellitus with diabetic peripheral angiopathy without gangrene: Secondary | ICD-10-CM | POA: Diagnosis not present

## 2021-05-25 DIAGNOSIS — J449 Chronic obstructive pulmonary disease, unspecified: Secondary | ICD-10-CM | POA: Diagnosis not present

## 2021-05-25 DIAGNOSIS — I480 Paroxysmal atrial fibrillation: Secondary | ICD-10-CM | POA: Diagnosis not present

## 2021-06-09 DIAGNOSIS — E782 Mixed hyperlipidemia: Secondary | ICD-10-CM | POA: Diagnosis not present

## 2021-06-09 DIAGNOSIS — J449 Chronic obstructive pulmonary disease, unspecified: Secondary | ICD-10-CM | POA: Diagnosis not present

## 2021-06-30 DIAGNOSIS — R0602 Shortness of breath: Secondary | ICD-10-CM | POA: Diagnosis not present

## 2021-06-30 DIAGNOSIS — J441 Chronic obstructive pulmonary disease with (acute) exacerbation: Secondary | ICD-10-CM | POA: Diagnosis not present

## 2021-07-05 DIAGNOSIS — J449 Chronic obstructive pulmonary disease, unspecified: Secondary | ICD-10-CM | POA: Diagnosis not present

## 2021-07-05 DIAGNOSIS — I480 Paroxysmal atrial fibrillation: Secondary | ICD-10-CM | POA: Diagnosis not present

## 2021-07-05 DIAGNOSIS — I442 Atrioventricular block, complete: Secondary | ICD-10-CM | POA: Diagnosis not present

## 2021-07-05 DIAGNOSIS — Z950 Presence of cardiac pacemaker: Secondary | ICD-10-CM | POA: Diagnosis not present

## 2021-07-05 DIAGNOSIS — R0609 Other forms of dyspnea: Secondary | ICD-10-CM | POA: Diagnosis not present

## 2021-07-10 DIAGNOSIS — J449 Chronic obstructive pulmonary disease, unspecified: Secondary | ICD-10-CM | POA: Diagnosis not present

## 2021-08-02 ENCOUNTER — Telehealth: Payer: Self-pay | Admitting: *Deleted

## 2021-08-02 NOTE — Telephone Encounter (Signed)
Asking when his appointment is, please call back

## 2021-08-02 NOTE — Telephone Encounter (Signed)
Called pt let him know when his next appt was.

## 2021-08-24 ENCOUNTER — Encounter: Payer: Self-pay | Admitting: Podiatry

## 2021-08-24 ENCOUNTER — Ambulatory Visit: Payer: Medicare Other | Admitting: Podiatry

## 2021-08-24 DIAGNOSIS — E11621 Type 2 diabetes mellitus with foot ulcer: Secondary | ICD-10-CM

## 2021-08-24 DIAGNOSIS — Q828 Other specified congenital malformations of skin: Secondary | ICD-10-CM | POA: Diagnosis not present

## 2021-08-24 DIAGNOSIS — L84 Corns and callosities: Secondary | ICD-10-CM | POA: Diagnosis not present

## 2021-08-24 DIAGNOSIS — B351 Tinea unguium: Secondary | ICD-10-CM

## 2021-08-24 DIAGNOSIS — M79674 Pain in right toe(s): Secondary | ICD-10-CM

## 2021-08-24 DIAGNOSIS — E1142 Type 2 diabetes mellitus with diabetic polyneuropathy: Secondary | ICD-10-CM | POA: Diagnosis not present

## 2021-08-24 DIAGNOSIS — M79675 Pain in left toe(s): Secondary | ICD-10-CM | POA: Diagnosis not present

## 2021-08-24 NOTE — Progress Notes (Signed)
This patient returns to the office for evaluation and treatment of long thick painful nails .  This patient is unable to trim his own nails since the patient cannot reach his feet.  Patient says the nails are painful walking and wearing his shoes.   Painful callus under left forefoot.   He also has corn fourth toe left foot. He returns for preventive foot care services.  General Appearance  Alert, conversant and in no acute stress.  Vascular  Dorsalis pedis and posterior tibial  pulses are palpable  bilaterally.  Capillary return is within normal limits  bilaterally. Temperature is within normal limits  bilaterally.  Neurologic  Senn-Weinstein monofilament wire test within normal limits  bilaterally. Muscle power within normal limits bilaterally.  Nails Thick disfigured discolored nails with subungual debris  from hallux to fifth toes bilaterally. No evidence of bacterial infection or drainage bilaterally.  Orthopedic  No limitations of motion  feet .  No crepitus or effusions noted.  No bony pathology or digital deformities noted.  Skin  normotropic skin with no porokeratosis noted bilaterally.  No signs of infections or ulcers noted.   Porokeratosis sub 4 left foot.  Corn 4th toe left foot.  Onychomycosis  Pain in toes right foot  Pain in toes left foot  Porokeratosis sub 4 left foot.  Corn 4th toe left foot.  Debridement  of nails  1-5  B/L with a nail nipper.  Nails were then filed using a dremel tool with no incidents. Debride callus with # 15 blade.   RTC  3  months    Era Parr DPM  

## 2021-09-26 DIAGNOSIS — H40053 Ocular hypertension, bilateral: Secondary | ICD-10-CM | POA: Diagnosis not present

## 2021-09-26 DIAGNOSIS — M069 Rheumatoid arthritis, unspecified: Secondary | ICD-10-CM | POA: Diagnosis not present

## 2021-09-26 DIAGNOSIS — H2513 Age-related nuclear cataract, bilateral: Secondary | ICD-10-CM | POA: Diagnosis not present

## 2021-09-26 DIAGNOSIS — E119 Type 2 diabetes mellitus without complications: Secondary | ICD-10-CM | POA: Diagnosis not present

## 2021-10-02 DIAGNOSIS — M8580 Other specified disorders of bone density and structure, unspecified site: Secondary | ICD-10-CM | POA: Diagnosis not present

## 2021-10-02 DIAGNOSIS — I2510 Atherosclerotic heart disease of native coronary artery without angina pectoris: Secondary | ICD-10-CM | POA: Diagnosis not present

## 2021-10-02 DIAGNOSIS — M25562 Pain in left knee: Secondary | ICD-10-CM | POA: Diagnosis not present

## 2021-10-02 DIAGNOSIS — M069 Rheumatoid arthritis, unspecified: Secondary | ICD-10-CM | POA: Diagnosis not present

## 2021-10-02 DIAGNOSIS — M1990 Unspecified osteoarthritis, unspecified site: Secondary | ICD-10-CM | POA: Diagnosis not present

## 2021-10-02 DIAGNOSIS — M5459 Other low back pain: Secondary | ICD-10-CM | POA: Diagnosis not present

## 2021-10-02 DIAGNOSIS — Z79899 Other long term (current) drug therapy: Secondary | ICD-10-CM | POA: Diagnosis not present

## 2021-10-04 DIAGNOSIS — Z79899 Other long term (current) drug therapy: Secondary | ICD-10-CM | POA: Diagnosis not present

## 2021-10-09 DIAGNOSIS — K219 Gastro-esophageal reflux disease without esophagitis: Secondary | ICD-10-CM | POA: Diagnosis not present

## 2021-10-09 DIAGNOSIS — G4733 Obstructive sleep apnea (adult) (pediatric): Secondary | ICD-10-CM | POA: Diagnosis not present

## 2021-10-09 DIAGNOSIS — J449 Chronic obstructive pulmonary disease, unspecified: Secondary | ICD-10-CM | POA: Diagnosis not present

## 2021-10-09 DIAGNOSIS — E782 Mixed hyperlipidemia: Secondary | ICD-10-CM | POA: Diagnosis not present

## 2021-10-09 DIAGNOSIS — Z0000 Encounter for general adult medical examination without abnormal findings: Secondary | ICD-10-CM | POA: Diagnosis not present

## 2021-10-09 DIAGNOSIS — M0510 Rheumatoid lung disease with rheumatoid arthritis of unspecified site: Secondary | ICD-10-CM | POA: Diagnosis not present

## 2021-10-09 DIAGNOSIS — M810 Age-related osteoporosis without current pathological fracture: Secondary | ICD-10-CM | POA: Diagnosis not present

## 2021-10-09 DIAGNOSIS — M069 Rheumatoid arthritis, unspecified: Secondary | ICD-10-CM | POA: Diagnosis not present

## 2021-10-09 DIAGNOSIS — N182 Chronic kidney disease, stage 2 (mild): Secondary | ICD-10-CM | POA: Diagnosis not present

## 2021-10-09 DIAGNOSIS — I480 Paroxysmal atrial fibrillation: Secondary | ICD-10-CM | POA: Diagnosis not present

## 2021-10-09 DIAGNOSIS — E1142 Type 2 diabetes mellitus with diabetic polyneuropathy: Secondary | ICD-10-CM | POA: Diagnosis not present

## 2021-10-09 DIAGNOSIS — I700 Atherosclerosis of aorta: Secondary | ICD-10-CM | POA: Diagnosis not present

## 2021-10-11 DIAGNOSIS — H40053 Ocular hypertension, bilateral: Secondary | ICD-10-CM | POA: Diagnosis not present

## 2021-10-24 DIAGNOSIS — I442 Atrioventricular block, complete: Secondary | ICD-10-CM | POA: Diagnosis not present

## 2021-11-01 ENCOUNTER — Ambulatory Visit: Payer: Medicare Other | Admitting: Podiatry

## 2021-11-01 ENCOUNTER — Encounter: Payer: Self-pay | Admitting: Podiatry

## 2021-11-01 DIAGNOSIS — Q828 Other specified congenital malformations of skin: Secondary | ICD-10-CM

## 2021-11-01 DIAGNOSIS — L603 Nail dystrophy: Secondary | ICD-10-CM

## 2021-11-01 NOTE — Patient Instructions (Signed)
Look for urea 17% + 2% salicylic cream or ointment and apply to the thickened dry skin / calluses. This can be bought over the counter, at a pharmacy or online such as [employer].

## 2021-11-02 DIAGNOSIS — I480 Paroxysmal atrial fibrillation: Secondary | ICD-10-CM | POA: Diagnosis not present

## 2021-11-02 DIAGNOSIS — I442 Atrioventricular block, complete: Secondary | ICD-10-CM | POA: Diagnosis not present

## 2021-11-02 DIAGNOSIS — R0609 Other forms of dyspnea: Secondary | ICD-10-CM | POA: Diagnosis not present

## 2021-11-02 DIAGNOSIS — E785 Hyperlipidemia, unspecified: Secondary | ICD-10-CM | POA: Diagnosis not present

## 2021-11-06 DIAGNOSIS — Z85828 Personal history of other malignant neoplasm of skin: Secondary | ICD-10-CM | POA: Diagnosis not present

## 2021-11-06 DIAGNOSIS — D485 Neoplasm of uncertain behavior of skin: Secondary | ICD-10-CM | POA: Diagnosis not present

## 2021-11-06 DIAGNOSIS — L570 Actinic keratosis: Secondary | ICD-10-CM | POA: Diagnosis not present

## 2021-11-06 DIAGNOSIS — D0461 Carcinoma in situ of skin of right upper limb, including shoulder: Secondary | ICD-10-CM | POA: Diagnosis not present

## 2021-11-06 NOTE — Progress Notes (Signed)
  Subjective:  Patient ID: Darryl Jones, male    DOB: 08-08-1955,  MRN: [id number]  Chief Complaint  Patient presents with   Callouses    "He had a hole on his big toe.  He has a hard place on the ball of his foot and on his heel." N - hole on toe (Ulcer) L - distal 2nd left D - Friday of last week O - suddenly C - red, oozing clear liquid, black area on the nail A - none T - peroxide    66 y.o. male presents with the above complaint. History confirmed with patient.   Objective:  Physical Exam: warm, good capillary refill, no trophic changes or ulcerative lesions, normal DP and PT pulses, normal sensory exam, and left great toenail shows dystrophy and some serous drainage from the nailbed, there is no ulceration deep to the nail, nail is well attached.  Assessment:   1. Nail dystrophy   2. Porokeratosis      Plan:  Patient was evaluated and treated and all questions answered.  Debrided the nail back to evaluate the nailbed.  There is no ulceration.  Appears to have some serous drainage from nail dystrophy and onychomycosis.  Remaining nails and calluses will be treated at his regular visit.  Recommend leaving open to air.  For the calluses he has I recommended urea cream and salicylic acid.  No follow-ups on file.

## 2021-11-08 DIAGNOSIS — H2513 Age-related nuclear cataract, bilateral: Secondary | ICD-10-CM | POA: Diagnosis not present

## 2021-11-08 DIAGNOSIS — H40053 Ocular hypertension, bilateral: Secondary | ICD-10-CM | POA: Diagnosis not present

## 2021-11-15 DIAGNOSIS — J449 Chronic obstructive pulmonary disease, unspecified: Secondary | ICD-10-CM | POA: Diagnosis not present

## 2021-11-20 DIAGNOSIS — M0540 Rheumatoid myopathy with rheumatoid arthritis of unspecified site: Secondary | ICD-10-CM | POA: Diagnosis not present

## 2021-11-21 DIAGNOSIS — M25562 Pain in left knee: Secondary | ICD-10-CM | POA: Diagnosis not present

## 2021-11-23 ENCOUNTER — Encounter: Payer: Self-pay | Admitting: Podiatry

## 2021-11-23 ENCOUNTER — Ambulatory Visit (INDEPENDENT_AMBULATORY_CARE_PROVIDER_SITE_OTHER): Payer: Medicare Other | Admitting: Podiatry

## 2021-11-23 DIAGNOSIS — E11621 Type 2 diabetes mellitus with foot ulcer: Secondary | ICD-10-CM

## 2021-11-23 DIAGNOSIS — M79676 Pain in unspecified toe(s): Secondary | ICD-10-CM

## 2021-11-23 DIAGNOSIS — B351 Tinea unguium: Secondary | ICD-10-CM | POA: Diagnosis not present

## 2021-11-23 DIAGNOSIS — Q828 Other specified congenital malformations of skin: Secondary | ICD-10-CM

## 2021-11-23 NOTE — Progress Notes (Signed)
This patient returns to the office for evaluation and treatment of long thick painful nails .  This patient is unable to trim his own nails since the patient cannot reach his feet.  Patient says the nails are painful walking and wearing his shoes.   Painful callus under left forefoot.   He also has corn fourth toe left foot. He returns for preventive foot care services.  General Appearance  Alert, conversant and in no acute stress.  Vascular  Dorsalis pedis and posterior tibial  pulses are palpable  bilaterally.  Capillary return is within normal limits  bilaterally. Temperature is within normal limits  bilaterally.  Neurologic  Senn-Weinstein monofilament wire test within normal limits  bilaterally. Muscle power within normal limits bilaterally.  Nails Thick disfigured discolored nails with subungual debris  from hallux to fifth toes bilaterally. No evidence of bacterial infection or drainage bilaterally.  Orthopedic  No limitations of motion  feet .  No crepitus or effusions noted.  No bony pathology or digital deformities noted.  Skin  normotropic skin with no porokeratosis noted bilaterally.  No signs of infections or ulcers noted.   Porokeratosis sub 4 left foot.  Corn 4th toe left foot.  Onychomycosis  Pain in toes right foot  Pain in toes left foot  Porokeratosis sub 4 left foot.  Corn 4th toe left foot.  Debridement  of nails  1-5  B/L with a nail nipper.  Nails were then filed using a dremel tool with no incidents. Debride callus with # 15 blade.   RTC  3  months    Gardiner Barefoot DPM

## 2021-12-04 DIAGNOSIS — M6281 Muscle weakness (generalized): Secondary | ICD-10-CM | POA: Diagnosis not present

## 2021-12-04 DIAGNOSIS — M799 Soft tissue disorder, unspecified: Secondary | ICD-10-CM | POA: Diagnosis not present

## 2021-12-04 DIAGNOSIS — M25362 Other instability, left knee: Secondary | ICD-10-CM | POA: Diagnosis not present

## 2021-12-08 DIAGNOSIS — M6281 Muscle weakness (generalized): Secondary | ICD-10-CM | POA: Diagnosis not present

## 2021-12-08 DIAGNOSIS — M799 Soft tissue disorder, unspecified: Secondary | ICD-10-CM | POA: Diagnosis not present

## 2021-12-08 DIAGNOSIS — M25362 Other instability, left knee: Secondary | ICD-10-CM | POA: Diagnosis not present

## 2021-12-11 DIAGNOSIS — H40021 Open angle with borderline findings, high risk, right eye: Secondary | ICD-10-CM | POA: Diagnosis not present

## 2021-12-11 DIAGNOSIS — H52203 Unspecified astigmatism, bilateral: Secondary | ICD-10-CM | POA: Diagnosis not present

## 2021-12-11 DIAGNOSIS — E119 Type 2 diabetes mellitus without complications: Secondary | ICD-10-CM | POA: Diagnosis not present

## 2021-12-11 DIAGNOSIS — H524 Presbyopia: Secondary | ICD-10-CM | POA: Diagnosis not present

## 2021-12-11 DIAGNOSIS — H5203 Hypermetropia, bilateral: Secondary | ICD-10-CM | POA: Diagnosis not present

## 2021-12-13 DIAGNOSIS — M6281 Muscle weakness (generalized): Secondary | ICD-10-CM | POA: Diagnosis not present

## 2021-12-13 DIAGNOSIS — M25362 Other instability, left knee: Secondary | ICD-10-CM | POA: Diagnosis not present

## 2021-12-13 DIAGNOSIS — M799 Soft tissue disorder, unspecified: Secondary | ICD-10-CM | POA: Diagnosis not present

## 2021-12-15 DIAGNOSIS — M799 Soft tissue disorder, unspecified: Secondary | ICD-10-CM | POA: Diagnosis not present

## 2021-12-15 DIAGNOSIS — M25362 Other instability, left knee: Secondary | ICD-10-CM | POA: Diagnosis not present

## 2021-12-15 DIAGNOSIS — M6281 Muscle weakness (generalized): Secondary | ICD-10-CM | POA: Diagnosis not present

## 2021-12-20 DIAGNOSIS — M6281 Muscle weakness (generalized): Secondary | ICD-10-CM | POA: Diagnosis not present

## 2021-12-20 DIAGNOSIS — M25362 Other instability, left knee: Secondary | ICD-10-CM | POA: Diagnosis not present

## 2021-12-20 DIAGNOSIS — M799 Soft tissue disorder, unspecified: Secondary | ICD-10-CM | POA: Diagnosis not present

## 2021-12-22 DIAGNOSIS — M25362 Other instability, left knee: Secondary | ICD-10-CM | POA: Diagnosis not present

## 2021-12-22 DIAGNOSIS — M799 Soft tissue disorder, unspecified: Secondary | ICD-10-CM | POA: Diagnosis not present

## 2021-12-22 DIAGNOSIS — M6281 Muscle weakness (generalized): Secondary | ICD-10-CM | POA: Diagnosis not present

## 2021-12-27 DIAGNOSIS — M6281 Muscle weakness (generalized): Secondary | ICD-10-CM | POA: Diagnosis not present

## 2021-12-27 DIAGNOSIS — M25362 Other instability, left knee: Secondary | ICD-10-CM | POA: Diagnosis not present

## 2021-12-27 DIAGNOSIS — M799 Soft tissue disorder, unspecified: Secondary | ICD-10-CM | POA: Diagnosis not present

## 2022-01-10 HISTORY — PX: GLAUCOMA SURGERY: SHX656

## 2022-01-22 DIAGNOSIS — N182 Chronic kidney disease, stage 2 (mild): Secondary | ICD-10-CM | POA: Diagnosis not present

## 2022-01-22 DIAGNOSIS — E1142 Type 2 diabetes mellitus with diabetic polyneuropathy: Secondary | ICD-10-CM | POA: Diagnosis not present

## 2022-01-22 DIAGNOSIS — M0579 Rheumatoid arthritis with rheumatoid factor of multiple sites without organ or systems involvement: Secondary | ICD-10-CM | POA: Diagnosis not present

## 2022-01-22 DIAGNOSIS — K219 Gastro-esophageal reflux disease without esophagitis: Secondary | ICD-10-CM | POA: Diagnosis not present

## 2022-01-22 DIAGNOSIS — J449 Chronic obstructive pulmonary disease, unspecified: Secondary | ICD-10-CM | POA: Diagnosis not present

## 2022-01-22 DIAGNOSIS — M810 Age-related osteoporosis without current pathological fracture: Secondary | ICD-10-CM | POA: Diagnosis not present

## 2022-01-22 DIAGNOSIS — E782 Mixed hyperlipidemia: Secondary | ICD-10-CM | POA: Diagnosis not present

## 2022-01-26 DIAGNOSIS — J449 Chronic obstructive pulmonary disease, unspecified: Secondary | ICD-10-CM | POA: Diagnosis not present

## 2022-02-12 DIAGNOSIS — G4733 Obstructive sleep apnea (adult) (pediatric): Secondary | ICD-10-CM | POA: Diagnosis not present

## 2022-02-12 DIAGNOSIS — I700 Atherosclerosis of aorta: Secondary | ICD-10-CM | POA: Diagnosis not present

## 2022-02-12 DIAGNOSIS — E782 Mixed hyperlipidemia: Secondary | ICD-10-CM | POA: Diagnosis not present

## 2022-02-12 DIAGNOSIS — Z23 Encounter for immunization: Secondary | ICD-10-CM | POA: Diagnosis not present

## 2022-02-12 DIAGNOSIS — E1142 Type 2 diabetes mellitus with diabetic polyneuropathy: Secondary | ICD-10-CM | POA: Diagnosis not present

## 2022-02-12 DIAGNOSIS — J449 Chronic obstructive pulmonary disease, unspecified: Secondary | ICD-10-CM | POA: Diagnosis not present

## 2022-02-12 DIAGNOSIS — D6869 Other thrombophilia: Secondary | ICD-10-CM | POA: Diagnosis not present

## 2022-02-12 DIAGNOSIS — N182 Chronic kidney disease, stage 2 (mild): Secondary | ICD-10-CM | POA: Diagnosis not present

## 2022-02-12 DIAGNOSIS — E1151 Type 2 diabetes mellitus with diabetic peripheral angiopathy without gangrene: Secondary | ICD-10-CM | POA: Diagnosis not present

## 2022-02-12 DIAGNOSIS — I480 Paroxysmal atrial fibrillation: Secondary | ICD-10-CM | POA: Diagnosis not present

## 2022-02-12 DIAGNOSIS — M069 Rheumatoid arthritis, unspecified: Secondary | ICD-10-CM | POA: Diagnosis not present

## 2022-02-13 ENCOUNTER — Ambulatory Visit
Admission: EM | Admit: 2022-02-13 | Discharge: 2022-02-13 | Discharge status: Against Medical Advice | Payer: Medicare Other

## 2022-02-13 DIAGNOSIS — S61011A Laceration without foreign body of right thumb without damage to nail, initial encounter: Secondary | ICD-10-CM | POA: Diagnosis not present

## 2022-02-22 ENCOUNTER — Ambulatory Visit
Admission: RE | Admit: 2022-02-22 | Discharge: 2022-02-22 | Disposition: A | Discharge status: Home / Self Care | Payer: Medicare Other | Source: Ambulatory Visit | Attending: Physician Assistant | Admitting: Physician Assistant

## 2022-02-22 ENCOUNTER — Other Ambulatory Visit: Payer: Self-pay | Admitting: Physician Assistant

## 2022-02-22 DIAGNOSIS — S61001A Unspecified open wound of right thumb without damage to nail, initial encounter: Secondary | ICD-10-CM | POA: Diagnosis not present

## 2022-02-22 DIAGNOSIS — L0390 Cellulitis, unspecified: Secondary | ICD-10-CM

## 2022-02-22 DIAGNOSIS — M79644 Pain in right finger(s): Secondary | ICD-10-CM

## 2022-02-22 DIAGNOSIS — Z872 Personal history of diseases of the skin and subcutaneous tissue: Secondary | ICD-10-CM | POA: Diagnosis not present

## 2022-02-25 DIAGNOSIS — J449 Chronic obstructive pulmonary disease, unspecified: Secondary | ICD-10-CM | POA: Diagnosis not present

## 2022-03-01 ENCOUNTER — Ambulatory Visit: Payer: Medicare Other | Admitting: Podiatry

## 2022-03-07 DIAGNOSIS — I442 Atrioventricular block, complete: Secondary | ICD-10-CM | POA: Diagnosis not present

## 2022-03-07 DIAGNOSIS — Z950 Presence of cardiac pacemaker: Secondary | ICD-10-CM | POA: Diagnosis not present

## 2022-03-07 DIAGNOSIS — R0609 Other forms of dyspnea: Secondary | ICD-10-CM | POA: Diagnosis not present

## 2022-03-07 DIAGNOSIS — J42 Unspecified chronic bronchitis: Secondary | ICD-10-CM | POA: Diagnosis not present

## 2022-03-07 DIAGNOSIS — E785 Hyperlipidemia, unspecified: Secondary | ICD-10-CM | POA: Diagnosis not present

## 2022-03-07 DIAGNOSIS — Z23 Encounter for immunization: Secondary | ICD-10-CM | POA: Diagnosis not present

## 2022-03-07 DIAGNOSIS — I480 Paroxysmal atrial fibrillation: Secondary | ICD-10-CM | POA: Diagnosis not present

## 2022-03-14 DIAGNOSIS — M1990 Unspecified osteoarthritis, unspecified site: Secondary | ICD-10-CM | POA: Diagnosis not present

## 2022-03-14 DIAGNOSIS — M5459 Other low back pain: Secondary | ICD-10-CM | POA: Diagnosis not present

## 2022-03-14 DIAGNOSIS — M0609 Rheumatoid arthritis without rheumatoid factor, multiple sites: Secondary | ICD-10-CM | POA: Diagnosis not present

## 2022-03-14 DIAGNOSIS — M25562 Pain in left knee: Secondary | ICD-10-CM | POA: Diagnosis not present

## 2022-03-14 DIAGNOSIS — I2510 Atherosclerotic heart disease of native coronary artery without angina pectoris: Secondary | ICD-10-CM | POA: Diagnosis not present

## 2022-03-14 DIAGNOSIS — M8580 Other specified disorders of bone density and structure, unspecified site: Secondary | ICD-10-CM | POA: Diagnosis not present

## 2022-03-14 DIAGNOSIS — Z79899 Other long term (current) drug therapy: Secondary | ICD-10-CM | POA: Diagnosis not present

## 2022-03-19 DIAGNOSIS — J849 Interstitial pulmonary disease, unspecified: Secondary | ICD-10-CM | POA: Diagnosis not present

## 2022-03-21 ENCOUNTER — Other Ambulatory Visit: Payer: Self-pay | Admitting: Pulmonary Disease

## 2022-03-21 DIAGNOSIS — J849 Interstitial pulmonary disease, unspecified: Secondary | ICD-10-CM

## 2022-03-28 DIAGNOSIS — J449 Chronic obstructive pulmonary disease, unspecified: Secondary | ICD-10-CM | POA: Diagnosis not present

## 2022-03-29 ENCOUNTER — Ambulatory Visit
Admission: RE | Admit: 2022-03-29 | Discharge: 2022-03-29 | Disposition: A | Discharge status: Home / Self Care | Payer: Medicare Other | Source: Ambulatory Visit | Attending: Pulmonary Disease | Admitting: Pulmonary Disease

## 2022-03-29 DIAGNOSIS — J849 Interstitial pulmonary disease, unspecified: Secondary | ICD-10-CM | POA: Diagnosis not present

## 2022-03-29 DIAGNOSIS — J439 Emphysema, unspecified: Secondary | ICD-10-CM | POA: Diagnosis not present

## 2022-03-29 DIAGNOSIS — R918 Other nonspecific abnormal finding of lung field: Secondary | ICD-10-CM | POA: Diagnosis not present

## 2022-03-29 DIAGNOSIS — J449 Chronic obstructive pulmonary disease, unspecified: Secondary | ICD-10-CM | POA: Diagnosis not present

## 2022-04-12 ENCOUNTER — Ambulatory Visit: Payer: Medicare Other | Admitting: Podiatry

## 2022-04-17 ENCOUNTER — Ambulatory Visit: Payer: Medicare Other | Admitting: Podiatry

## 2022-04-18 DIAGNOSIS — Z8719 Personal history of other diseases of the digestive system: Secondary | ICD-10-CM | POA: Diagnosis not present

## 2022-04-18 DIAGNOSIS — M069 Rheumatoid arthritis, unspecified: Secondary | ICD-10-CM | POA: Diagnosis not present

## 2022-04-18 DIAGNOSIS — E1142 Type 2 diabetes mellitus with diabetic polyneuropathy: Secondary | ICD-10-CM | POA: Diagnosis not present

## 2022-04-18 DIAGNOSIS — Z9889 Other specified postprocedural states: Secondary | ICD-10-CM | POA: Diagnosis not present

## 2022-04-19 ENCOUNTER — Other Ambulatory Visit (HOSPITAL_COMMUNITY): Payer: Self-pay | Admitting: Internal Medicine

## 2022-04-19 DIAGNOSIS — E782 Mixed hyperlipidemia: Secondary | ICD-10-CM | POA: Diagnosis not present

## 2022-04-19 DIAGNOSIS — M069 Rheumatoid arthritis, unspecified: Secondary | ICD-10-CM | POA: Diagnosis not present

## 2022-04-19 DIAGNOSIS — R1900 Intra-abdominal and pelvic swelling, mass and lump, unspecified site: Secondary | ICD-10-CM

## 2022-04-19 DIAGNOSIS — N182 Chronic kidney disease, stage 2 (mild): Secondary | ICD-10-CM | POA: Diagnosis not present

## 2022-04-19 DIAGNOSIS — M810 Age-related osteoporosis without current pathological fracture: Secondary | ICD-10-CM | POA: Diagnosis not present

## 2022-04-19 DIAGNOSIS — I480 Paroxysmal atrial fibrillation: Secondary | ICD-10-CM | POA: Diagnosis not present

## 2022-04-19 DIAGNOSIS — J449 Chronic obstructive pulmonary disease, unspecified: Secondary | ICD-10-CM | POA: Diagnosis not present

## 2022-04-19 DIAGNOSIS — E1142 Type 2 diabetes mellitus with diabetic polyneuropathy: Secondary | ICD-10-CM | POA: Diagnosis not present

## 2022-04-19 DIAGNOSIS — K219 Gastro-esophageal reflux disease without esophagitis: Secondary | ICD-10-CM | POA: Diagnosis not present

## 2022-04-21 ENCOUNTER — Emergency Department
Admission: EM | Admit: 2022-04-21 | Discharge: 2022-04-21 | Disposition: A | Discharge status: Home / Self Care | Payer: Medicare Other | Attending: Emergency Medicine | Admitting: Emergency Medicine

## 2022-04-21 ENCOUNTER — Encounter: Payer: Self-pay | Admitting: Emergency Medicine

## 2022-04-21 ENCOUNTER — Emergency Department: Payer: Medicare Other

## 2022-04-21 DIAGNOSIS — R079 Chest pain, unspecified: Secondary | ICD-10-CM

## 2022-04-21 DIAGNOSIS — E119 Type 2 diabetes mellitus without complications: Secondary | ICD-10-CM | POA: Diagnosis not present

## 2022-04-21 DIAGNOSIS — I10 Essential (primary) hypertension: Secondary | ICD-10-CM | POA: Diagnosis not present

## 2022-04-21 DIAGNOSIS — J449 Chronic obstructive pulmonary disease, unspecified: Secondary | ICD-10-CM | POA: Diagnosis not present

## 2022-04-21 DIAGNOSIS — R0789 Other chest pain: Secondary | ICD-10-CM | POA: Insufficient documentation

## 2022-04-21 LAB — CBC
HCT: 48.3 % (ref 39.0–52.0)
Hemoglobin: 15.9 g/dL (ref 13.0–17.0)
MCH: 29.1 pg (ref 26.0–34.0)
MCHC: 32.9 g/dL (ref 30.0–36.0)
MCV: 88.3 fL (ref 80.0–100.0)
Platelets: 207 10*3/uL (ref 150–400)
RBC: 5.47 MIL/uL (ref 4.22–5.81)
RDW: 13.6 % (ref 11.5–15.5)
WBC: 12.9 10*3/uL — ABNORMAL HIGH (ref 4.0–10.5)
nRBC: 0 % (ref 0.0–0.2)

## 2022-04-21 LAB — BASIC METABOLIC PANEL
Anion gap: 11 (ref 5–15)
BUN: 14 mg/dL (ref 8–23)
CO2: 22 mmol/L (ref 22–32)
Calcium: 8.4 mg/dL — ABNORMAL LOW (ref 8.9–10.3)
Chloride: 98 mmol/L (ref 98–111)
Creatinine, Ser: 1.36 mg/dL — ABNORMAL HIGH (ref 0.61–1.24)
GFR, Estimated: 57 mL/min — ABNORMAL LOW (ref >60)
Glucose, Bld: 273 mg/dL — ABNORMAL HIGH (ref 70–99)
Potassium: 4.1 mmol/L (ref 3.5–5.1)
Sodium: 131 mmol/L — ABNORMAL LOW (ref 135–145)

## 2022-04-21 LAB — TROPONIN I (HIGH SENSITIVITY): Troponin I (High Sensitivity): 9 ng/L (ref <18)

## 2022-04-21 NOTE — ED Provider Notes (Signed)
[facility] Provider Note   Event Date/Time   First MD Initiated Contact with Patient 04/21/22 [phone]     (approximate) History  Chest Pain  HPI Darryl Jones is a 67 y.o. male with stated past medical history of type 2 diabetes, atrial fibrillation with pacemaker in place, COPD, and hypertension who presents for intermittent chest pain that has been present over the last 2 days.  Patient states that this pain is associated with a feeling of chills.  Patient states that this pain occurs during any exertion and partially resolves at rest.  Patient denies any other exacerbating or relieving factors ROS: Patient currently denies any vision changes, tinnitus, difficulty speaking, facial droop, sore throat, chest pain, shortness of breath, abdominal pain, nausea/vomiting/diarrhea, dysuria, or weakness/numbness/paresthesias in any extremity   Physical Exam  Triage Vital Signs: ED Triage Vitals  Enc Vitals Group     BP 04/21/22 0115 136/80     Pulse Rate 04/21/22 0115 81     Resp 04/21/22 0115 18     Temp 04/21/22 0115 98.3 F (36.8 C)     Temp Source 04/21/22 0115 Oral     SpO2 04/21/22 0115 99 %     Weight 04/21/22 0114 196 lb 10.4 oz (89.2 kg)     Height 04/21/22 0114 5' 6"$  (1.676 m)     Head Circumference --      Peak Flow --      Pain Score 04/21/22 0114 6     Pain Loc --      Pain Edu? --      Excl. in [place]? --    Most recent vital signs: Vitals:   04/21/22 0115 04/21/22 0300  BP: 136/80 130/74  Pulse: 81 77  Resp: 18   Temp: 98.3 F (36.8 C)   SpO2: 99% 96%   General: Awake, oriented x4. CV:  Good peripheral perfusion.  Resp:  Normal effort.  Abd:  No distention.  Other:  Overweight elderly Caucasian male laying in bed in no acute distress ED Results / Procedures / Treatments  Labs (all labs ordered are listed, but only abnormal results are displayed) Labs Reviewed  BASIC METABOLIC PANEL - Abnormal; Notable for the following components:       Result Value   Sodium 131 (*)    Glucose, Bld 273 (*)    Creatinine, Ser 1.36 (*)    Calcium 8.4 (*)    GFR, Estimated 57 (*)    All other components within normal limits  CBC - Abnormal; Notable for the following components:   WBC 12.9 (*)    All other components within normal limits  TROPONIN I (HIGH SENSITIVITY)  TROPONIN I (HIGH SENSITIVITY)   EKG ED ECG REPORT I, Naaman Plummer, the attending physician, personally viewed and interpreted this ECG. Date: 04/21/2022 EKG Time: 0117 Rate: 92 Rhythm: Atrially sensed ventricularly paced rhythm QRS Axis: normal Intervals: normal ST/T Wave abnormalities: normal Narrative Interpretation: Atrially sensed and ventricularly paced rhythm.  No evidence of acute ischemia RADIOLOGY ED MD interpretation: One-view portable chest x-ray interpreted by me shows no evidence of acute abnormalities including no pneumonia, pneumothorax, or widened mediastinum -Agree with radiology assessment Official radiology report(s): DG Chest 1 View  Result Date: 04/21/2022 CLINICAL DATA:  Chest pain EXAM: CHEST  1 VIEW COMPARISON:  06/30/2021 FINDINGS: Left pacer remains in place, unchanged. Heart and mediastinal contours are within normal limits. No focal opacities or effusions. No acute bony abnormality. IMPRESSION: No active  disease. Electronically Signed   By: [name] M.D.   On: 04/21/2022 01:32   PROCEDURES: Critical Care performed: No .1-3 Lead EKG Interpretation  Performed by: Naaman Plummer, MD Authorized by: Naaman Plummer, MD     Interpretation: abnormal     ECG rate:  89   ECG rate assessment: normal     Rhythm: paced     Ectopy: none     Conduction: normal    MEDICATIONS ORDERED IN ED: Medications - No data to display IMPRESSION / MDM / [place] / ED COURSE  I reviewed the triage vital signs and the nursing notes.                             The patient is on the cardiac monitor to evaluate for evidence of arrhythmia  and/or significant heart rate changes. Patient's presentation is most consistent with acute presentation with potential threat to life or bodily function. Workup: ECG, CXR, CBC, BMP, Troponin Findings: ECG: No overt evidence of STEMI. No evidence of Brugadas sign, delta wave, epsilon wave, significantly prolonged QTc, or malignant arrhythmia HS Troponin: Negative x1 Other Labs unremarkable for emergent problems. CXR: Without PTX, PNA, or widened mediastinum Last Stress Test:  04/06/2021 Last Heart Catheterization:  10/28/2017 HEART Score: 4  Given History, Exam, and Workup I have low suspicion for ACS, Pneumothorax, Pneumonia, Pulmonary Embolus, Tamponade, Aortic Dissection or other emergent problem as a cause for this presentation.   Reassesment: Prior to discharge patients pain was controlled and they were well appearing.  Disposition:  Discharge. Strict return precautions discussed with patient with full understanding. Advised patient to follow up promptly with primary care provider    FINAL CLINICAL IMPRESSION(S) / ED DIAGNOSES   Final diagnoses:  Nonspecific chest pain   Rx / DC Orders   ED Discharge Orders     None      Note:  This document was prepared using Dragon voice recognition software and may include unintentional dictation errors.   Naaman Plummer, MD 04/21/22 [phone]

## 2022-04-21 NOTE — ED Triage Notes (Addendum)
Pt presents via POV with complaints of L sided CP for the last 10 hours. Pt states that it was gradual in nature and now more pronounced.  Rates the pain 2022-08-22. Hx of afib, COPD, pacemaker - denies any firing or abnormalities. Denies N/V/D, fevers, chills, SOB.

## 2022-04-25 ENCOUNTER — Other Ambulatory Visit: Payer: Self-pay

## 2022-04-25 ENCOUNTER — Ambulatory Visit: Admission: RE | Admit: 2022-04-25 | Payer: Medicare Other | Source: Ambulatory Visit

## 2022-04-25 ENCOUNTER — Emergency Department: Payer: Medicare Other

## 2022-04-25 ENCOUNTER — Emergency Department
Admission: EM | Admit: 2022-04-25 | Discharge: 2022-04-25 | Disposition: A | Discharge status: Home / Self Care | Payer: Medicare Other | Attending: Emergency Medicine | Admitting: Emergency Medicine

## 2022-04-25 DIAGNOSIS — J449 Chronic obstructive pulmonary disease, unspecified: Secondary | ICD-10-CM | POA: Diagnosis not present

## 2022-04-25 DIAGNOSIS — I10 Essential (primary) hypertension: Secondary | ICD-10-CM | POA: Insufficient documentation

## 2022-04-25 DIAGNOSIS — Z950 Presence of cardiac pacemaker: Secondary | ICD-10-CM | POA: Diagnosis not present

## 2022-04-25 DIAGNOSIS — Z20822 Contact with and (suspected) exposure to covid-19: Secondary | ICD-10-CM | POA: Insufficient documentation

## 2022-04-25 DIAGNOSIS — E119 Type 2 diabetes mellitus without complications: Secondary | ICD-10-CM | POA: Insufficient documentation

## 2022-04-25 DIAGNOSIS — R791 Abnormal coagulation profile: Secondary | ICD-10-CM | POA: Insufficient documentation

## 2022-04-25 DIAGNOSIS — R0602 Shortness of breath: Secondary | ICD-10-CM | POA: Diagnosis not present

## 2022-04-25 DIAGNOSIS — R079 Chest pain, unspecified: Secondary | ICD-10-CM | POA: Diagnosis not present

## 2022-04-25 DIAGNOSIS — R0789 Other chest pain: Secondary | ICD-10-CM | POA: Diagnosis not present

## 2022-04-25 DIAGNOSIS — Z743 Need for continuous supervision: Secondary | ICD-10-CM | POA: Diagnosis not present

## 2022-04-25 DIAGNOSIS — J189 Pneumonia, unspecified organism: Secondary | ICD-10-CM | POA: Insufficient documentation

## 2022-04-25 LAB — TROPONIN I (HIGH SENSITIVITY)
Troponin I (High Sensitivity): 13 ng/L (ref <18)
Troponin I (High Sensitivity): 13 ng/L (ref <18)

## 2022-04-25 LAB — RESP PANEL BY RT-PCR (RSV, FLU A&B, COVID)  RVPGX2
Influenza A by PCR: NEGATIVE
Influenza B by PCR: NEGATIVE
Resp Syncytial Virus by PCR: NEGATIVE
SARS Coronavirus 2 by RT PCR: NEGATIVE

## 2022-04-25 LAB — CBC WITH DIFFERENTIAL/PLATELET
Abs Immature Granulocytes: 0.04 10*3/uL (ref 0.00–0.07)
Basophils Absolute: 0 10*3/uL (ref 0.0–0.1)
Basophils Relative: 0 %
Eosinophils Absolute: 0 10*3/uL (ref 0.0–0.5)
Eosinophils Relative: 0 %
HCT: 42.8 % (ref 39.0–52.0)
Hemoglobin: 14.2 g/dL (ref 13.0–17.0)
Immature Granulocytes: 0 %
Lymphocytes Relative: 19 %
Lymphs Abs: 2 10*3/uL (ref 0.7–4.0)
MCH: 28.7 pg (ref 26.0–34.0)
MCHC: 33.2 g/dL (ref 30.0–36.0)
MCV: 86.6 fL (ref 80.0–100.0)
Monocytes Absolute: 1.1 10*3/uL — ABNORMAL HIGH (ref 0.1–1.0)
Monocytes Relative: 10 %
Neutro Abs: 7.1 10*3/uL (ref 1.7–7.7)
Neutrophils Relative %: 71 %
Platelets: 220 10*3/uL (ref 150–400)
RBC: 4.94 MIL/uL (ref 4.22–5.81)
RDW: 13.3 % (ref 11.5–15.5)
WBC: 10.2 10*3/uL (ref 4.0–10.5)
nRBC: 0 % (ref 0.0–0.2)

## 2022-04-25 LAB — COMPREHENSIVE METABOLIC PANEL
ALT: 31 U/L (ref 0–44)
AST: 31 U/L (ref 15–41)
Albumin: 2.8 g/dL — ABNORMAL LOW (ref 3.5–5.0)
Alkaline Phosphatase: 20 U/L — ABNORMAL LOW (ref 38–126)
Anion gap: 10 (ref 5–15)
BUN: 19 mg/dL (ref 8–23)
CO2: 24 mmol/L (ref 22–32)
Calcium: 8.1 mg/dL — ABNORMAL LOW (ref 8.9–10.3)
Chloride: 100 mmol/L (ref 98–111)
Creatinine, Ser: 1.27 mg/dL — ABNORMAL HIGH (ref 0.61–1.24)
GFR, Estimated: >60 mL/min (ref >60)
Glucose, Bld: 169 mg/dL — ABNORMAL HIGH (ref 70–99)
Potassium: 4.2 mmol/L (ref 3.5–5.1)
Sodium: 134 mmol/L — ABNORMAL LOW (ref 135–145)
Total Bilirubin: 0.8 mg/dL (ref 0.3–1.2)
Total Protein: 6.8 g/dL (ref 6.5–8.1)

## 2022-04-25 LAB — D-DIMER, QUANTITATIVE: D-Dimer, Quant: 0.65 ug/mL-FEU — ABNORMAL HIGH (ref 0.00–0.50)

## 2022-04-25 LAB — BRAIN NATRIURETIC PEPTIDE: B Natriuretic Peptide: 62.2 pg/mL (ref 0.0–100.0)

## 2022-04-25 MED ORDER — LEVOFLOXACIN 750 MG PO TABS: 750.0000 mg | ORAL_TABLET | Freq: Every day | ORAL | 0 refills | Status: AC

## 2022-04-25 MED ORDER — LEVOFLOXACIN IN D5W 750 MG/150ML IV SOLN
750.0000 mg | Freq: Once | INTRAVENOUS | Status: AC
  Administered 2022-04-25: 750 mg via INTRAVENOUS
  Filled 2022-04-25: qty 150

## 2022-04-25 NOTE — ED Provider Notes (Signed)
[facility] Provider Note    Event Date/Time   First MD Initiated Contact with Patient 04/25/22 [phone]     (approximate)   History   Chest Pain   HPI  Darryl Jones is a 67 y.o. 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  He reports associated nonproductive cough as well as shortness of breath.  He also has bodyaches and generalized weakness.  Denies any leg pain or swelling.  He has no vomiting or diarrhea.  He has no fever.  I reviewed the past medical records.  The patient was seen for chest pain on 2/10 and had negative troponin and other workup at that time.  His most recent outpatient encounter was on 11/23/2021 with podiatry for painful nails.   Physical Exam   Triage Vital Signs: ED Triage Vitals  Enc Vitals Group     BP 04/25/22 0851 (!) 143/88     Pulse Rate 04/25/22 0851 96     Resp 04/25/22 0851 (!) 22     Temp 04/25/22 0851 97.8 F (36.6 C)     Temp Source 04/25/22 0851 Oral     SpO2 04/25/22 0851 96 %     Weight --      Height 04/25/22 0850 5' 6"$  (1.676 m)     Head Circumference --      Peak Flow --      Pain Score --      Pain Loc --      Pain Edu? --      Excl. in [place]? --     Most recent vital signs: Vitals:   04/25/22 1100 04/25/22 1130  BP: 114/71 127/72  Pulse: 81 77  Resp: (!) 23 (!) 22  Temp:    SpO2: 96% 96%     General: Alert, uncomfortable appearing but in no acute distress. CV:  Good peripheral perfusion.  Normal heart sounds. Resp:  Normal effort.  Lungs with somewhat diminished breath sounds bilaterally but no wheezes or rales. Abd:  No distention.  Other:  No peripheral edema.   ED Results / Procedures / Treatments   Labs (all labs  ordered are listed, but only abnormal results are displayed) Labs Reviewed  COMPREHENSIVE METABOLIC PANEL - Abnormal; Notable for the following components:      Result Value   Sodium 134 (*)    Glucose, Bld 169 (*)    Creatinine, Ser 1.27 (*)    Calcium 8.1 (*)    Albumin 2.8 (*)    Alkaline Phosphatase 20 (*)    All other components within normal limits  CBC WITH DIFFERENTIAL/PLATELET - Abnormal; Notable for the following components:   Monocytes Absolute 1.1 (*)    All other components within normal limits  D-DIMER, QUANTITATIVE - Abnormal; Notable for the following components:   D-Dimer, Quant 0.65 (*)    All other components within normal limits  RESP PANEL BY RT-PCR (RSV, FLU A&B, COVID)  RVPGX2  BRAIN NATRIURETIC PEPTIDE  TROPONIN I (HIGH SENSITIVITY)  TROPONIN I (HIGH SENSITIVITY)     EKG  ED ECG REPORT I, Arta Silence, the attending physician, personally viewed and interpreted this ECG.  Date: 04/25/2022 EKG Time: 0901 Rate: 82 Rhythm: Atrial sensed ventricular paced rhythm ST/T Wave abnormalities: normal Narrative Interpretation:  no evidence of acute ischemia; no significant change when compared to EKG of 04/21/2022   RADIOLOGY  Chest x-ray: I independently viewed and interpreted the images; there is a left upper lung opacity consistent with pneumonia   PROCEDURES:  Critical Care performed: No  Procedures   MEDICATIONS ORDERED IN ED: Medications  levofloxacin (LEVAQUIN) IVPB 750 mg (750 mg Intravenous New Bag/Given 04/25/22 1136)     IMPRESSION / MDM / [place] / ED COURSE  I reviewed the triage vital signs and the nursing notes.  67 year old male with PMH as noted above presents with somewhat atypical chest pain, cough, and shortness of breath over the last 5 days.  Differential diagnosis includes, but is not limited to, pneumonia, COVID-19, influenza, other viral syndrome, acute bronchitis, new onset CHF, ACS, musculoskeletal pain.   I have a lower suspicion for PE given the lack of tachycardia or hypoxia.  There is no evidence of aortic dissection or other vascular cause.  We will obtain chest x-ray, lab workup including troponin, BNP to rule out new onset CHF, and D-dimer, respiratory panel, and reassess.  Patient's presentation is most consistent with acute presentation with potential threat to life or bodily function.  The patient is on the cardiac monitor to evaluate for evidence of arrhythmia and/or significant heart rate changes.   ----------------------------------------- 12:42 PM on 04/25/2022 -----------------------------------------  Chest x-ray shows pneumonia which is new since his most recent ED visit.  Lab workup is overall reassuring.  Troponins are negative x 2.  Respiratory panel is negative.  D-dimer is minimally elevated, however age corrected it is normal.  Minimal elevation is also to be expected in an acute infection.  At this time there is no indication for CT angio or further workup.  On reassessment the patient states he is feeling somewhat better.  I did consider whether he may benefit from admission given his age and comorbidities, however given the reassuring labs, stable vital signs, lack of any oxygen requirement, he is appropriate for outpatient treatment.  The patient agrees and states he would like to go home.  I gave a dose of IV Levaquin here and will prescribe the same for home.  I gave him and his family member strict return precautions and they expressed understanding.   FINAL CLINICAL IMPRESSION(S) / ED DIAGNOSES   Final diagnoses:  Community acquired pneumonia of left lung, unspecified part of lung     Rx / DC Orders   ED Discharge Orders          Ordered    levofloxacin (LEVAQUIN) 750 MG tablet  Daily        04/25/22 1236             Note:  This document was prepared using Dragon voice recognition software and may include unintentional dictation errors.     Arta Silence, MD 04/25/22 1244

## 2022-04-25 NOTE — ED Triage Notes (Signed)
Patient states left sided chest pain and SOB for 5 days, was seen here for same on 04/21/22.

## 2022-04-25 NOTE — ED Notes (Signed)
Vitals stable, pt ambulates with a steady gait

## 2022-04-25 NOTE — Discharge Instructions (Addendum)
Take the antibiotic as prescribed and finish the full course.  Use your albuterol breathing treatments and other regular medications as prescribed.  Return to the ER for new, worsening, or persistent severe shortness of breath, chest pain, weakness or lightheadedness, high fever, vomiting, inability take the medication, or any other new or worsening symptoms that concern you.

## 2022-04-28 DIAGNOSIS — J449 Chronic obstructive pulmonary disease, unspecified: Secondary | ICD-10-CM | POA: Diagnosis not present

## 2022-04-30 DIAGNOSIS — R0602 Shortness of breath: Secondary | ICD-10-CM | POA: Diagnosis not present

## 2022-05-04 ENCOUNTER — Ambulatory Visit: Payer: Medicare Other | Admitting: Podiatry

## 2022-05-04 DIAGNOSIS — E11621 Type 2 diabetes mellitus with foot ulcer: Secondary | ICD-10-CM | POA: Diagnosis not present

## 2022-05-04 DIAGNOSIS — M79676 Pain in unspecified toe(s): Secondary | ICD-10-CM | POA: Diagnosis not present

## 2022-05-04 DIAGNOSIS — B351 Tinea unguium: Secondary | ICD-10-CM | POA: Diagnosis not present

## 2022-05-04 NOTE — Progress Notes (Signed)
This patient returns to the office for evaluation and treatment of long thick painful nails .  This patient is unable to trim his own nails since the patient cannot reach his feet.  Patient says the nails are painful walking and wearing his shoes.   Painful callus under left forefoot.   He also has corn fourth toe left foot. He returns for preventive foot care services.  General Appearance  Alert, conversant and in no acute stress.  Vascular  Dorsalis pedis and posterior tibial  pulses are palpable  bilaterally.  Capillary return is within normal limits  bilaterally. Temperature is within normal limits  bilaterally.  Neurologic  Senn-Weinstein monofilament wire test within normal limits  bilaterally. Muscle power within normal limits bilaterally.  Nails Thick disfigured discolored nails with subungual debris  from hallux to fifth toes bilaterally. No evidence of bacterial infection or drainage bilaterally.  Orthopedic  No limitations of motion  feet .  No crepitus or effusions noted.  No bony pathology or digital deformities noted.  Skin  normotropic skin with no porokeratosis noted bilaterally.  No signs of infections or ulcers noted.   Porokeratosis sub 4 left foot.  Corn 4th toe left foot.  Onychomycosis  Pain in toes right foot  Pain in toes left foot  Porokeratosis sub 4 left foot.  Corn 4th toe left foot.  Debridement  of nails  1-5  B/L with a nail nipper.  Nails were then filed using a dremel tool with no incidents. Debride callus with # 15 blade.   RTC  3  months   Boneta Lucks DPM

## 2022-05-08 ENCOUNTER — Other Ambulatory Visit: Payer: Self-pay | Admitting: Pulmonary Disease

## 2022-05-08 ENCOUNTER — Other Ambulatory Visit: Payer: Medicare Other

## 2022-05-08 DIAGNOSIS — J189 Pneumonia, unspecified organism: Secondary | ICD-10-CM

## 2022-05-18 ENCOUNTER — Other Ambulatory Visit: Payer: Self-pay | Admitting: Internal Medicine

## 2022-05-18 DIAGNOSIS — R109 Unspecified abdominal pain: Secondary | ICD-10-CM

## 2022-05-23 DIAGNOSIS — Z950 Presence of cardiac pacemaker: Secondary | ICD-10-CM | POA: Diagnosis not present

## 2022-05-23 DIAGNOSIS — I442 Atrioventricular block, complete: Secondary | ICD-10-CM | POA: Diagnosis not present

## 2022-05-24 ENCOUNTER — Other Ambulatory Visit: Payer: Self-pay | Admitting: Pulmonary Disease

## 2022-05-24 DIAGNOSIS — J189 Pneumonia, unspecified organism: Secondary | ICD-10-CM

## 2022-05-25 ENCOUNTER — Ambulatory Visit
Admission: RE | Admit: 2022-05-25 | Discharge: 2022-05-25 | Disposition: A | Discharge status: Home / Self Care | Payer: Medicare Other | Source: Ambulatory Visit | Attending: Pulmonary Disease | Admitting: Pulmonary Disease

## 2022-05-25 DIAGNOSIS — J439 Emphysema, unspecified: Secondary | ICD-10-CM | POA: Diagnosis not present

## 2022-05-25 DIAGNOSIS — J189 Pneumonia, unspecified organism: Secondary | ICD-10-CM | POA: Insufficient documentation

## 2022-05-25 DIAGNOSIS — R109 Unspecified abdominal pain: Secondary | ICD-10-CM | POA: Diagnosis not present

## 2022-05-25 DIAGNOSIS — R918 Other nonspecific abnormal finding of lung field: Secondary | ICD-10-CM | POA: Diagnosis not present

## 2022-05-25 DIAGNOSIS — N281 Cyst of kidney, acquired: Secondary | ICD-10-CM | POA: Diagnosis not present

## 2022-05-25 MED ORDER — IOHEXOL 300 MG/ML  SOLN
100.0000 mL | Freq: Once | INTRAMUSCULAR | Status: AC | PRN
  Administered 2022-05-25: 100 mL via INTRAVENOUS

## 2022-05-27 DIAGNOSIS — J449 Chronic obstructive pulmonary disease, unspecified: Secondary | ICD-10-CM | POA: Diagnosis not present

## 2022-07-05 DIAGNOSIS — R911 Solitary pulmonary nodule: Secondary | ICD-10-CM | POA: Diagnosis not present

## 2022-07-05 DIAGNOSIS — R918 Other nonspecific abnormal finding of lung field: Secondary | ICD-10-CM | POA: Diagnosis not present

## 2022-07-05 DIAGNOSIS — J449 Chronic obstructive pulmonary disease, unspecified: Secondary | ICD-10-CM | POA: Diagnosis not present

## 2022-07-09 DIAGNOSIS — D0461 Carcinoma in situ of skin of right upper limb, including shoulder: Secondary | ICD-10-CM | POA: Diagnosis not present

## 2022-07-09 DIAGNOSIS — L570 Actinic keratosis: Secondary | ICD-10-CM | POA: Diagnosis not present

## 2022-07-09 DIAGNOSIS — L821 Other seborrheic keratosis: Secondary | ICD-10-CM | POA: Diagnosis not present

## 2022-07-09 DIAGNOSIS — D0462 Carcinoma in situ of skin of left upper limb, including shoulder: Secondary | ICD-10-CM | POA: Diagnosis not present

## 2022-07-09 DIAGNOSIS — Z85828 Personal history of other malignant neoplasm of skin: Secondary | ICD-10-CM | POA: Diagnosis not present

## 2022-07-09 DIAGNOSIS — D045 Carcinoma in situ of skin of trunk: Secondary | ICD-10-CM | POA: Diagnosis not present

## 2022-07-09 DIAGNOSIS — L814 Other melanin hyperpigmentation: Secondary | ICD-10-CM | POA: Diagnosis not present

## 2022-07-20 DIAGNOSIS — E119 Type 2 diabetes mellitus without complications: Secondary | ICD-10-CM | POA: Diagnosis not present

## 2022-07-20 DIAGNOSIS — H2513 Age-related nuclear cataract, bilateral: Secondary | ICD-10-CM | POA: Diagnosis not present

## 2022-07-20 DIAGNOSIS — H401134 Primary open-angle glaucoma, bilateral, indeterminate stage: Secondary | ICD-10-CM | POA: Diagnosis not present

## 2022-08-03 ENCOUNTER — Other Ambulatory Visit: Payer: Self-pay | Admitting: Pulmonary Disease

## 2022-08-03 DIAGNOSIS — R911 Solitary pulmonary nodule: Secondary | ICD-10-CM

## 2022-08-13 ENCOUNTER — Ambulatory Visit: Payer: Medicare Other | Admitting: Podiatry

## 2022-08-13 ENCOUNTER — Encounter: Payer: Self-pay | Admitting: Podiatry

## 2022-08-13 DIAGNOSIS — B351 Tinea unguium: Secondary | ICD-10-CM

## 2022-08-13 DIAGNOSIS — M79676 Pain in unspecified toe(s): Secondary | ICD-10-CM

## 2022-08-13 DIAGNOSIS — Q828 Other specified congenital malformations of skin: Secondary | ICD-10-CM

## 2022-08-13 DIAGNOSIS — E1142 Type 2 diabetes mellitus with diabetic polyneuropathy: Secondary | ICD-10-CM | POA: Diagnosis not present

## 2022-08-16 NOTE — Progress Notes (Signed)
  Subjective:  Patient ID: Darryl Jones, male    DOB: March 02, 1956,  MRN: [id number]  Darryl Jones presents to clinic today for: at risk foot care with history of diabetic neuropathy and painful porokeratotic lesion(s) both feet and painful mycotic toenails that limit ambulation. Painful toenails interfere with ambulation. Aggravating factors include wearing enclosed shoe gear. Pain is relieved with periodic professional debridement. Painful porokeratotic lesions are aggravated when weightbearing with and without shoegear. Pain is relieved with periodic professional debridement.  Chief Complaint  Patient presents with   Nail Problem    DFC,A1C:7.6,Referring Provider Georgann Housekeeper, MD,LOV:12/23,BS:unknown       PCP is Georgann Housekeeper, MD.  Allergies  Allergen Reactions   Hydroxyquinolines Anaphylaxis   Loratadine Anaphylaxis and Other (See Comments)   Other Anaphylaxis and Other (See Comments)   Adalimumab Other (See Comments)   Hydroxychloroquine Other (See Comments)    Elevated LFTs   Leflunomide Other (See Comments)   Methotrexate Other (See Comments)   Methotrexate Derivatives Other (See Comments)    Elevated lft's   Aspirin Rash and Other (See Comments)   Protonix [Pantoprazole Sodium] Nausea Only    Review of Systems: Negative except as noted in the HPI.  Objective: No changes noted in today's physical examination. There were no vitals filed for this visit.  Darryl Jones is a pleasant 67 y.o. male in NAD. AAO x 3.  Vascular Examination: Capillary refill time <3 seconds b/l LE. Palpable pedal pulses b/l LE. Digital hair sparse b/l. No pedal edema b/l. Skin temperature gradient WNL b/l. No varicosities b/l. No cyanosis or clubbing noted b/l LE.Marland Kitchen  Dermatological Examination: Pedal skin with normal turgor, texture and tone b/l. No open wounds. No interdigital macerations b/l. Toenails 1-5 b/l thickened, discolored, dystrophic with subungual debris. There is pain on  palpation to dorsal aspect of nailplates. Porokeratotic lesion(s) bilateral heels and submet head 4 left foot. No erythema, no edema, no drainage, no fluctuance..  Neurological Examination: Protective sensation intact with 10 gram monofilament b/l LE. Vibratory sensation intact b/l LE. Pt has subjective symptoms of neuropathy. Protective sensation intact 5/5 intact bilaterally with 10g monofilament b/l. Vibratory sensation intact b/l.  Musculoskeletal Examination: Muscle strength 5/5 to all lower extremity muscle groups bilaterally. Plantarflexed metatarsal(s) 4th metatarsal head left lower extremity.  Assessment/Plan: 1. Pain due to onychomycosis of toenail   2. Porokeratosis   3. Diabetic peripheral neuropathy associated with type 2 diabetes mellitus (HCC)     -Patient was evaluated and treated. All patient's and/or POA's questions/concerns answered on today's visit. -Will submit preauthorization to insurance company for diabetic shoes with 3 pairs of insoles. -Patient to continue soft, supportive shoe gear daily. -Toenails 1-5 b/l were debrided in length and girth with sterile nail nippers and dremel without iatrogenic bleeding.  -Porokeratotic lesion(s) bilateral heels and submet head 4 left foot pared and enucleated with sterile currette without incident. Total number of lesions debrided=3. -Patient/POA to call should there be question/concern in the interim.   Return in about 3 months (around 11/13/2022).  Freddie Breech, DPM

## 2022-08-20 DIAGNOSIS — H401134 Primary open-angle glaucoma, bilateral, indeterminate stage: Secondary | ICD-10-CM | POA: Diagnosis not present

## 2022-08-21 ENCOUNTER — Ambulatory Visit: Admission: RE | Admit: 2022-08-21 | Payer: Medicare Other | Source: Ambulatory Visit

## 2022-08-22 ENCOUNTER — Ambulatory Visit
Admission: RE | Admit: 2022-08-22 | Discharge: 2022-08-22 | Disposition: A | Discharge status: Home / Self Care | Payer: Medicare Other | Source: Ambulatory Visit | Attending: Pulmonary Disease | Admitting: Pulmonary Disease

## 2022-08-22 DIAGNOSIS — J439 Emphysema, unspecified: Secondary | ICD-10-CM | POA: Diagnosis not present

## 2022-08-22 DIAGNOSIS — R911 Solitary pulmonary nodule: Secondary | ICD-10-CM | POA: Insufficient documentation

## 2022-09-05 ENCOUNTER — Other Ambulatory Visit: Payer: Self-pay | Admitting: Pulmonary Disease

## 2022-09-05 DIAGNOSIS — J42 Unspecified chronic bronchitis: Secondary | ICD-10-CM | POA: Diagnosis not present

## 2022-09-05 DIAGNOSIS — E785 Hyperlipidemia, unspecified: Secondary | ICD-10-CM | POA: Diagnosis not present

## 2022-09-05 DIAGNOSIS — E119 Type 2 diabetes mellitus without complications: Secondary | ICD-10-CM | POA: Diagnosis not present

## 2022-09-05 DIAGNOSIS — I480 Paroxysmal atrial fibrillation: Secondary | ICD-10-CM | POA: Diagnosis not present

## 2022-09-05 DIAGNOSIS — R911 Solitary pulmonary nodule: Secondary | ICD-10-CM

## 2022-09-05 DIAGNOSIS — Z950 Presence of cardiac pacemaker: Secondary | ICD-10-CM | POA: Diagnosis not present

## 2022-09-05 DIAGNOSIS — I442 Atrioventricular block, complete: Secondary | ICD-10-CM | POA: Diagnosis not present

## 2022-09-05 DIAGNOSIS — E1151 Type 2 diabetes mellitus with diabetic peripheral angiopathy without gangrene: Secondary | ICD-10-CM | POA: Diagnosis not present

## 2022-09-19 DIAGNOSIS — M545 Low back pain, unspecified: Secondary | ICD-10-CM | POA: Diagnosis not present

## 2022-09-19 DIAGNOSIS — M069 Rheumatoid arthritis, unspecified: Secondary | ICD-10-CM | POA: Diagnosis not present

## 2022-09-19 DIAGNOSIS — I2510 Atherosclerotic heart disease of native coronary artery without angina pectoris: Secondary | ICD-10-CM | POA: Diagnosis not present

## 2022-09-19 DIAGNOSIS — M25562 Pain in left knee: Secondary | ICD-10-CM | POA: Diagnosis not present

## 2022-09-19 DIAGNOSIS — M1990 Unspecified osteoarthritis, unspecified site: Secondary | ICD-10-CM | POA: Diagnosis not present

## 2022-09-19 DIAGNOSIS — Z79899 Other long term (current) drug therapy: Secondary | ICD-10-CM | POA: Diagnosis not present

## 2022-09-19 DIAGNOSIS — M8580 Other specified disorders of bone density and structure, unspecified site: Secondary | ICD-10-CM | POA: Diagnosis not present

## 2022-09-27 DIAGNOSIS — M542 Cervicalgia: Secondary | ICD-10-CM | POA: Diagnosis not present

## 2022-09-27 DIAGNOSIS — R1310 Dysphagia, unspecified: Secondary | ICD-10-CM | POA: Diagnosis not present

## 2022-10-04 DIAGNOSIS — R911 Solitary pulmonary nodule: Secondary | ICD-10-CM | POA: Diagnosis not present

## 2022-10-04 DIAGNOSIS — R918 Other nonspecific abnormal finding of lung field: Secondary | ICD-10-CM | POA: Diagnosis not present

## 2022-10-15 DIAGNOSIS — R918 Other nonspecific abnormal finding of lung field: Secondary | ICD-10-CM | POA: Diagnosis not present

## 2022-10-19 ENCOUNTER — Other Ambulatory Visit: Payer: Self-pay | Admitting: Pulmonary Disease

## 2022-10-19 DIAGNOSIS — R911 Solitary pulmonary nodule: Secondary | ICD-10-CM

## 2022-10-22 ENCOUNTER — Encounter
Admission: RE | Admit: 2022-10-22 | Discharge: 2022-10-22 | Disposition: A | Discharge status: Home / Self Care | Payer: Medicare Other | Source: Ambulatory Visit | Attending: Pulmonary Disease | Admitting: Pulmonary Disease

## 2022-10-22 VITALS — Ht 66.0 in | Wt 192.0 lb

## 2022-10-22 DIAGNOSIS — Z01812 Encounter for preprocedural laboratory examination: Secondary | ICD-10-CM

## 2022-10-22 DIAGNOSIS — E11621 Type 2 diabetes mellitus with foot ulcer: Secondary | ICD-10-CM

## 2022-10-22 DIAGNOSIS — I482 Chronic atrial fibrillation, unspecified: Secondary | ICD-10-CM

## 2022-10-22 HISTORY — DX: Pneumonia, unspecified organism: J18.9

## 2022-10-22 HISTORY — DX: Essential (primary) hypertension: I10

## 2022-10-22 HISTORY — DX: Cardiogenic shock: R57.0

## 2022-10-22 HISTORY — DX: Atherosclerosis of aorta: I70.0

## 2022-10-22 HISTORY — DX: Sepsis, unspecified organism: A41.9

## 2022-10-22 HISTORY — DX: Rheumatoid arthritis, unspecified: M06.9

## 2022-10-22 HISTORY — DX: Unspecified glaucoma: H40.9

## 2022-10-22 HISTORY — DX: Unspecified chronic bronchitis: J42

## 2022-10-22 HISTORY — DX: Gastro-esophageal reflux disease without esophagitis: K21.9

## 2022-10-22 HISTORY — DX: Paroxysmal atrial fibrillation: I48.0

## 2022-10-22 HISTORY — DX: Type 2 diabetes mellitus with diabetic peripheral angiopathy without gangrene: E11.51

## 2022-10-22 HISTORY — DX: Personal history of other diseases of the digestive system: Z87.19

## 2022-10-22 HISTORY — DX: Atrioventricular block, complete: I44.2

## 2022-10-22 NOTE — Patient Instructions (Addendum)
Your procedure is scheduled on: Monday, August 19 Report to the Registration Desk on the 1st floor of the [facility] To find out your arrival time, please call [phone] between 1PM - 3PM on: Friday, August 16 If your arrival time is 6:00 am, do not arrive before that time as the [facility] entrance doors do not open until 6:00 am.  REMEMBER: Instructions that are not followed completely may result in serious medical risk, up to and including death; or upon the discretion of your surgeon and anesthesiologist your surgery may need to be rescheduled.  Do not eat food after midnight the night before surgery.  No gum chewing or hard candies.  You may however, drink water up to 2 hours before you are scheduled to arrive for your surgery. Do not drink anything within 2 hours of your scheduled arrival time.  One week prior to surgery: starting August 12 Stop Anti-inflammatories (NSAIDS) such as Advil, Aleve, Ibuprofen, Motrin, Naproxen, Naprosyn and Aspirin based products such as Excedrin, Goody's Powder, BC Powder. Stop ANY OVER THE COUNTER supplements until after surgery. Stop vitamin D. You may however, continue to take Tylenol if needed for pain up until the day of surgery.  Continue taking all prescribed medications with the exception of the following:  Per instructions from Dr. Terence Lux office...Marland KitchenMarland KitchenEliquis - hold for 3 days. Last day to take is Thursday, August 15. Resume AFTER surgery per surgeon instruction.  TAKE ONLY THESE MEDICATIONS THE MORNING OF SURGERY WITH A SIP OF WATER:  Atorvastatin (Lipitor) Trelegy inhaler Duoneb nebulizer Dorzolamide eye drops  Use inhalers on the day of surgery and bring your albuterol inhaler to the hospital.  No Alcohol for 24 hours before or after surgery.  No Smoking including e-cigarettes for 24 hours before surgery.  No chewable tobacco products for at least 6 hours before surgery.  No nicotine patches on the day of surgery.  Do  not use any "recreational" drugs for at least a week (preferably 2 weeks) before your surgery.  Please be advised that the combination of cocaine and anesthesia may have negative outcomes, up to and including death. If you test positive for cocaine, your surgery will be cancelled.  On the morning of surgery brush your teeth with toothpaste and water, you may rinse your mouth with mouthwash if you wish. Do not swallow any toothpaste or mouthwash.  Do not wear jewelry, make-up, hairpins, clips or nail polish.  Do not wear lotions, powders, or perfumes.   Do not shave body hair from the neck down 48 hours before surgery.  Contact lenses, hearing aids and dentures may not be worn into surgery.  Do not bring valuables to the hospital. [facility] is not responsible for any missing/lost belongings or valuables.   Notify your doctor if there is any change in your medical condition (cold, fever, infection).  Wear comfortable clothing (specific to your surgery type) to the hospital.  After surgery, you can help prevent lung complications by doing breathing exercises.  Take deep breaths and cough every 1-2 hours.   If you are being discharged the day of surgery, you will not be allowed to drive home. You will need a responsible individual to drive you home and stay with you for 24 hours after surgery.   If you are taking public transportation, you will need to have a responsible individual with you.  Please call the Pre-admissions Testing Dept. at [phone] if you have any questions about these instructions.  Surgery  Visitation Policy:  Patients having surgery or a procedure may have two visitors.  Children under the age of 3 must have an adult with them who is not the patient.

## 2022-10-23 ENCOUNTER — Encounter: Payer: Self-pay | Admitting: Urgent Care

## 2022-10-23 ENCOUNTER — Ambulatory Visit
Admission: RE | Admit: 2022-10-23 | Discharge: 2022-10-23 | Disposition: A | Discharge status: Home / Self Care | Payer: Medicare Other | Source: Ambulatory Visit | Attending: Pulmonary Disease | Admitting: Pulmonary Disease

## 2022-10-23 ENCOUNTER — Encounter
Admission: RE | Admit: 2022-10-23 | Discharge: 2022-10-23 | Disposition: A | Discharge status: Home / Self Care | Payer: Medicare Other | Source: Ambulatory Visit | Attending: Pulmonary Disease | Admitting: Pulmonary Disease

## 2022-10-23 DIAGNOSIS — R9431 Abnormal electrocardiogram [ECG] [EKG]: Secondary | ICD-10-CM | POA: Insufficient documentation

## 2022-10-23 DIAGNOSIS — R918 Other nonspecific abnormal finding of lung field: Secondary | ICD-10-CM | POA: Diagnosis not present

## 2022-10-23 DIAGNOSIS — Z01812 Encounter for preprocedural laboratory examination: Secondary | ICD-10-CM | POA: Insufficient documentation

## 2022-10-23 DIAGNOSIS — R911 Solitary pulmonary nodule: Secondary | ICD-10-CM

## 2022-10-23 DIAGNOSIS — Z01810 Encounter for preprocedural cardiovascular examination: Secondary | ICD-10-CM | POA: Insufficient documentation

## 2022-10-23 DIAGNOSIS — E11621 Type 2 diabetes mellitus with foot ulcer: Secondary | ICD-10-CM

## 2022-10-23 DIAGNOSIS — L97909 Non-pressure chronic ulcer of unspecified part of unspecified lower leg with unspecified severity: Secondary | ICD-10-CM | POA: Insufficient documentation

## 2022-10-23 DIAGNOSIS — I482 Chronic atrial fibrillation, unspecified: Secondary | ICD-10-CM | POA: Insufficient documentation

## 2022-10-23 DIAGNOSIS — J439 Emphysema, unspecified: Secondary | ICD-10-CM | POA: Diagnosis not present

## 2022-10-23 DIAGNOSIS — Z01818 Encounter for other preprocedural examination: Secondary | ICD-10-CM | POA: Diagnosis not present

## 2022-10-23 DIAGNOSIS — I442 Atrioventricular block, complete: Secondary | ICD-10-CM | POA: Diagnosis not present

## 2022-10-23 DIAGNOSIS — I700 Atherosclerosis of aorta: Secondary | ICD-10-CM | POA: Diagnosis not present

## 2022-10-23 LAB — CBC
HCT: 47.8 % (ref 39.0–52.0)
Hemoglobin: 16 g/dL (ref 13.0–17.0)
MCH: 28.4 pg (ref 26.0–34.0)
MCHC: 33.5 g/dL (ref 30.0–36.0)
MCV: 84.8 fL (ref 80.0–100.0)
Platelets: 276 10*3/uL (ref 150–400)
RBC: 5.64 MIL/uL (ref 4.22–5.81)
RDW: 14.5 % (ref 11.5–15.5)
WBC: 8.3 10*3/uL (ref 4.0–10.5)
nRBC: 0 % (ref 0.0–0.2)

## 2022-10-23 LAB — BASIC METABOLIC PANEL
Anion gap: 9 (ref 5–15)
BUN: 15 mg/dL (ref 8–23)
CO2: 27 mmol/L (ref 22–32)
Calcium: 9 mg/dL (ref 8.9–10.3)
Chloride: 102 mmol/L (ref 98–111)
Creatinine, Ser: 0.99 mg/dL (ref 0.61–1.24)
GFR, Estimated: >60 mL/min (ref >60)
Glucose, Bld: 140 mg/dL — ABNORMAL HIGH (ref 70–99)
Potassium: 4.4 mmol/L (ref 3.5–5.1)
Sodium: 138 mmol/L (ref 135–145)

## 2022-10-23 LAB — PROTIME-INR
INR: 1.2 (ref 0.8–1.2)
Prothrombin Time: 15.5 seconds — ABNORMAL HIGH (ref 11.4–15.2)

## 2022-10-23 LAB — APTT: aPTT: 29 seconds (ref 24–36)

## 2022-10-23 NOTE — Progress Notes (Signed)
Perioperative / Anesthesia Services  Pre-Admission Testing Clinical Review / Preoperative Anesthesia Consult  Date: 10/26/22  Patient Demographics:  Name: Darryl Jones DOB:   01-18-1956 MRN:   [id number]  Planned Surgical Procedure(s):    Case: [id number] Date/Time: 10/29/22 1200   Procedures:      VIDEO BRONCHOSCOPY WITH ENDOBRONCHIAL ULTRASOUND     ROBOTIC ASSISTED NAVIGATIONAL BRONCHOSCOPY   Anesthesia type: General   Pre-op diagnosis: Lung Mass, R91.8   Location: [facility] PROCEDURE RM 02 / [facility] ORS FOR ANESTHESIA GROUP   Surgeons: Vida Rigger, MD     NOTE: Available PAT nursing documentation and vital signs have been reviewed. Clinical nursing staff has updated patient's PMH/PSHx, current medication list, and drug allergies/intolerances to ensure comprehensive history available to assist in medical decision making as it pertains to the aforementioned surgical procedure and anticipated anesthetic course. Extensive review of available clinical information personally performed.  PMH and PSHx updated with any diagnoses/procedures that  may have been inadvertently omitted during his intake with the pre-admission testing department's nursing staff.  Clinical Discussion:  Darryl Jones is a 67 y.o. male who is submitted for pre-surgical anesthesia review and clearance prior to him undergoing the above procedure. Patient is a Former Smoker (86 pack years; quit 05/2010). Pertinent PMH includes: CAD, diastolic dysfunction, PAF, complete heart block (s/p PPM placement), aortic atherosclerosis, HTN, HLD, T2DM, interstitial rheumatoid disease of the lung, COPD, SOB, LEFT upper lobe pulmonary mass, laryngopharyngeal reflux, RA, glaucoma.  Patient is followed by cardiology Darrold Junker, MD). He was last seen in the cardiology clinic on 09/05/2022; notes reviewed. At the time of his clinic visit, patient doing well overall from a cardiovascular perspective.  Patient with chronic exertional  dyspnea related to his underlying COPD diagnosis and previous RIGHT pulmonary wedge resection.  Patient also complaining of mild peripheral edema that is also chronic and reported to be stable and at baseline.  Patient denied any chest pain, PND, orthopnea, palpitations, weakness, fatigue, vertiginous symptoms, or presyncope/syncope. Patient with a past medical history significant for cardiovascular diagnoses. Documented physical exam was grossly benign, providing no evidence of acute exacerbation and/or decompensation of the patient's known cardiovascular conditions.  Patient with a history of complete heart block requiring placement of a implanted cardiac pacemaker.  [employer] Azure XT DR MRI SureScan dual-chamber pacemaker placed on 10/28/2017.  Device is regularly interrogated by patient's primary cardiology/electrophysiology team.  Device was last interrogated on 10/23/2022, at which time he was noted to be functioning properly.  Most recent TTE was performed on 02/17/2021 revealing a mildly reduced left ventricular systolic function with an EF of 45%.  There was septal, apical, and inferior wall hypokinesis. Left ventricular diastolic Doppler parameters consistent with abnormal relaxation (G1DD).  Right ventricle mildly enlarged.  There was trivial to mild mitral, tricuspid, and pulmonary valve regurgitation.  All transvalvular gradients were noted to be normal providing no evidence suggestive of valvular stenosis.  RVSP 36.4 mmHg.  Aorta normal in size with no evidence of aneurysmal dilatation.  Myocardial perfusion imaging study performed on 03/27/2021 revealed normal left ventricular systolic function with an EF of 57%.  There were no regional wall motion abnormalities.  SPECT images demonstrated a small reversible perfusion abnormality of mild intensity present in the inferoapical region consistent with ischemia.  ECG was nondiagnostic.  Patient demonstrated a hypertensive response to  exercise.  Patient with an atrial fibrillation diagnosis; CHA2DS2-VASc Score = 4 (age, HTN, vascular disease history, T2DM). Cardiac rate and rhythm currently maintained intrinsically  without the need for pharmacological intervention.  Of note, patient has had atrial fibrillation episodes (up to 414) noted on recent pacemaker interrogations with the longest lasting 12 hours.  With that said, patient is minimally symptomatic; denies vertiginous symptoms, syncope, palpitations.  Patient is chronically anticoagulated using apixaban; reported be compliant with therapy with no evidence or reports of GI bleeding.  Blood pressure well-controlled at 124/76 mmHg without the need for medications.  He is on atorvastatin for his HLD diagnosis and ASCVD prevention.  T2DM reasonably controlled on currently prescribed regimen; last HgbA1c was 7.6%.  Patient makes efforts to maintain an active lifestyle, however he does not exercise regularly.  He is able to complete all of his ADLs/IADLs independently without cardiovascular limitation.  Per the [facility], patient is able to complete at least 4 METS of physical activity without experiencing any significant angina/anginal equivalent symptoms.  No changes were made to his medication regimen.  Patient follow-up with outpatient cardiology in 6 months or sooner if needed.  DANTE KEETCH found to have an irregular area of consolidation in the anterior LEFT upper lobe with surrounding heterogeneous groundglass density.  Finding has been persistent since 05/2021 with slight interval change in morphology.  Additionally, most recent CT imaging of the chest performed on 08/22/2022 revealed a new 10 x 9 mm spiculated pulmonary nodule in the RIGHT lower lobe.  Findings concerning for possible primary bronchogenic carcinoma.  PET imaging has been recommended patient has been referred to pulmonary medicine for discussions regarding definitive diagnosis.  He has subsequently been scheduled for a  VIDEO BRONCHOSCOPY WITH ENDOBRONCHIAL ULTRASOUND; ROBOTIC ASSISTED NAVIGATIONAL BRONCHOSCOPY on 10/29/2022 with Dr. Vida Rigger, MD.  Given patient's past medical history significant for cardiovascular diagnoses, presurgical cardiac clearance was sought by the PAT team. Per cardiology, "this patient is optimized for surgery and may proceed with the planned procedural course with a LOW risk of significant perioperative cardiovascular complications".   Again, this patient is on daily oral anticoagulation therapy using a DOAC medication.  He has been instructed on recommendations for holding his apixaban for 3 days prior to his procedure with plans to restart as soon as postoperative bleeding risk felt to be minimized by his attending surgeon. The patient has been instructed that his last dose of his apixaban should be on 10/25/2022.  Patient denies previous perioperative complications with anesthesia in the past. In review of the available records, it is noted that patient underwent a general anesthetic course here at [facility] in 10/2017 without documented complications.      10/22/2022    9:33 AM 04/25/2022   12:57 PM 04/25/2022   11:30 AM  Vitals with BMI  Height 5\' 6"     Weight 192 lbs    BMI 31    Systolic  112 127  Diastolic  77 72  Pulse  80 77    Providers/Specialists:   NOTE: Primary physician provider listed below. Patient may have been seen by APP or partner within same practice.   PROVIDER ROLE / SPECIALTY LAST Yolanda Manges, MD Pulmonary Medicine (Surgeon) 10/15/2022  Georgann Housekeeper, MD Primary Care Provider 10/11/2022  Marcina Millard, MD Cardiology 09/05/2022   Allergies:  Hydroxyquinolines, Loratadine, Other, Adalimumab, Hydroxychloroquine, Leflunomide, Methotrexate, Methotrexate derivatives, Aspirin, and Protonix [pantoprazole sodium]  Current Home Medications:   No current facility-administered medications for  this encounter.    brimonidine (ALPHAGAN) 0.2 % ophthalmic solution   acetaminophen (TYLENOL) 650 MG CR tablet  Albuterol Sulfate (PROAIR RESPICLICK) 108 (90 Base) MCG/ACT AEPB   atorvastatin (LIPITOR) 20 MG tablet   Cholecalciferol (VITAMIN D3) 10 MCG (400 UNIT) CAPS   ELIQUIS 5 MG TABS tablet   Fluticasone-Umeclidin-Vilant (TRELEGY ELLIPTA) 100-62.5-25 MCG/ACT AEPB   glimepiride (AMARYL) 1 MG tablet   ipratropium-albuterol (DUONEB) 0.5-2.5 (3) MG/3ML SOLN   latanoprost (XALATAN) 0.005 % ophthalmic solution   History:   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)    a.) s/p surgery 10/02/2013 --> bronchoscopy + RIGHT VATS with diagnostic RUL/RML/RLL wedge resections + RIGHT pleural biopsy, and partial decortication --> pathology consistent with rheumatoid lung disease   Elevated liver enzymes    a.) secondary to use of MTX for RA diagnosis   Glaucoma    Hearing loss    Hepatic steatosis    History of hiatal hernia    Hyperlipidemia    Hypertension    Laryngopharyngeal reflux    Mass of upper lobe of left lung    On apixaban therapy    Osteoporosis    Paroxysmal atrial fibrillation (HCC)    a.) CHA2DS2VASc = 4 (age, HTN, vascular disease history, T2DM);  b.) rate/rhythm maintained intrisically without phamacological intervention; chronically anticoagulated with apixaban   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) 10/28/2017   a.) MDT Azure XT DR MRI SureScan dual chamber PPM (SN: [id number])    Rheumatoid arthritis (HCC)    Rosacea, acne    Sepsis (HCC)    Shortness of breath    Third degree AV block (HCC)    a.) s/p MDT dual chamber PPM placement 10/28/2017   Type 2 diabetes mellitus with peripheral angiopathy (HCC)    Past Surgical History:  Procedure Laterality Date   ABDOMINAL EXPLORATION SURGERY  1966   undescended testicle; explore for cancer   GLAUCOMA SURGERY Right 01/2022   laser (did not take)   HIATAL HERNIA REPAIR  2000   KNEE SURGERY Left 1995   knee cap shattered; wire in place   LUNG SURGERY Right    abscess lung biopsy (right lower)   PACEMAKER INSERTION Left 10/28/2017   Procedure: INSERTION PACEMAKER-INITIAL INSERT DUAL CHAMBER;  Surgeon: Marcina Millard, MD;  Location: [facility] ORS;  Service: Cardiovascular;  Laterality: Left;   SUBMANDIBULAR GLAND EXCISION Right 01/07/2013   Dr Mahlon Gammon GLAND EXCISION Right 01/07/2013   Procedure: REMOVAL OF RIGHT SUBMANDIBULAR GLAND;  Surgeon: Serena Colonel, MD;  Location: [facility] OR;  Service: ENT;  Laterality: Right;   TONSILLECTOMY     child   Family History  Problem Relation Age of Onset   Diabetes Sister    Heart disease Sister    Kidney disease Sister    Diabetes Brother    Social History   Tobacco Use   Smoking status: Former    Current packs/day: 0.00    Types: Cigarettes    Quit date: 06/08/2010    Years since quitting: 12.3   Smokeless tobacco: Never  Tobacco comments:    "quit smoking cigarettes in 2012"  Vaping Use   Vaping status: Never Used  Substance Use Topics   Alcohol use: No   Drug use: No    Pertinent Clinical Results:  LABS:   No visits with results within 3 Day(s) from this visit.  Latest known visit with results is:  Hospital Outpatient Visit on 10/23/2022  Component Date Value Ref Range Status   Sodium 10/23/2022 138  135 - 145 mmol/L Final   Potassium 10/23/2022 4.4  3.5 - 5.1 mmol/L Final   Chloride 10/23/2022 102  98 - 111 mmol/L Final   CO2 10/23/2022 27   22 - 32 mmol/L Final   Glucose, Bld 10/23/2022 140 (H)  70 - 99 mg/dL Final   Glucose reference range applies only to samples taken after fasting for at least 8 hours.   BUN 10/23/2022 15  8 - 23 mg/dL Final   Creatinine, Ser 10/23/2022 0.99  0.61 - 1.24 mg/dL Final   Calcium 16/12/9602 9.0  8.9 - 10.3 mg/dL Final   GFR, Estimated 10/23/2022 >60  >60 mL/min Final   Comment: (NOTE) Calculated using the CKD-EPI Creatinine Equation (2021)    Anion gap 10/23/2022 9  5 - 15 Final   Performed at [facility], [address] [place], [place] [address]   Prothrombin Time 10/23/2022 15.5 (H)  11.4 - 15.2 seconds Final   INR 10/23/2022 1.2  0.8 - 1.2 Final   Comment: (NOTE) INR goal varies based on device and disease states. Performed at [facility], [address] [place], [place] [address]    aPTT 10/23/2022 29  24 - 36 seconds Final   Performed at [facility], [address] [place], [place] [address]   WBC 10/23/2022 8.3  4.0 - 10.5 K/uL Final   RBC 10/23/2022 5.64  4.22 - 5.81 MIL/uL Final   Hemoglobin 10/23/2022 16.0  13.0 - 17.0 g/dL Final   HCT 62/13/0865 47.8  39.0 - 52.0 % Final   MCV 10/23/2022 84.8  80.0 - 100.0 fL Final   MCH 10/23/2022 28.4  26.0 - 34.0 pg Final   MCHC 10/23/2022 33.5  30.0 - 36.0 g/dL Final   RDW 78/46/9629 14.5  11.5 - 15.5 % Final   Platelets 10/23/2022 276  150 - 400 K/uL Final   nRBC 10/23/2022 0.0  0.0 - 0.2 % Final   Performed at [facility], [address] [place], [place] [address]    ECG: Date: 10/26/2022 Time ECG obtained: 1445 PM Rate: 63 bpm Rhythm:  Atrial sensed ventricular paced rhythm Axis (leads I and aVF): Normal Intervals: PR 184 ms. QRS 180 ms. QTc 491 ms. ST segment and T wave changes: No evidence of acute ST segment elevation or depression.   Comparison: Similar to previous tracing obtained on 04/25/2022   IMAGING / PROCEDURES: CT CHEST WO CONTRAST performed on 08/22/2022 Irregular  consolidation in the left upper lobe anteriorly with surrounding heterogeneous ground-glass density is persistent compared to CT from March. Although there is slight interval change in morphology of the area of consolidation, it is largely non resolved, raising concern for potential neoplastic process. In addition, there is a new 10 x 9 mm slightly spiculated pulmonary nodule in the right lower lobe compared to previous. Recommend multi care disciplinary thoracic consultation and potential PET CT for further assessment. Other previously noted largely right-sided pulmonary nodules are not significantly changed in size. Aortic atherosclerosis  Emphysema  CT  ABDOMEN PELVIS W CONTRAST performed on 05/25/2022 No acute findings within the abdomen or pelvis. Mild decreased liver attenuation suggesting hepatic steatosis. Aortic atherosclerosis.  MYOCARDIAL PERFUSION IMAGING STUDY (LEXISCAN) performed on 03/27/2021 Normal left ventricular systolic function with a normal LVEF of 57% Normal myocardial thickening and wall motion Left ventricular cavity size normal SPECT images demonstrate small reversible perfusion abnormality of mild intensity in the inferoapical region consistent with ischemia  No evidence of stress-induced arrhythmia  TRANSTHORACIC ECHOCARDIOGRAM performed on 02/17/2021 Normal left ventricular systolic function with an EF of 45% Septal, apical, and inferior hypokinesis Left ventricular diastolic Doppler parameters consistent with abnormal relaxation (G1DD). Mild right ventricular enlargement Mild MR and TR Trivial PR RVSP = 36.4 mmHg Normal gradients; no valvular stenosis  Impression and Plan:  Darryl Jones has been referred for pre-anesthesia review and clearance prior to him undergoing the planned anesthetic and procedural courses. Available labs, pertinent testing, and imaging results were personally reviewed by me in preparation for upcoming operative/procedural course.  [facility] medical record has been updated following extensive record review and patient interview with PAT staff.   This patient has been appropriately cleared by cardiology with an overall LOW risk of experiencing significant perioperative cardiovascular complications. Completed perioperative prescription for cardiac device management documentation completed by primary cardiology team and placed on patient's chart for review by the surgical/anesthetic team on the day of his procedure. Electrophysiology indicating that procedure should not interfere with planned surgical procedure. Beyond normal perioperative cardiovascular monitoring, there are no recommendations from electrophysiology team that prompt further discussion/recommendations from industry representative.   Based on clinical review performed today (10/26/22), barring any significant acute changes in the patient's overall condition, it is anticipated that he will be able to proceed with the planned surgical intervention. Any acute changes in clinical condition may necessitate his procedure being postponed and/or cancelled. Patient will meet with anesthesia team (MD and/or CRNA) on the day of his procedure for preoperative evaluation/assessment. Questions regarding anesthetic course will be fielded at that time.   Pre-surgical instructions were reviewed with the patient during his PAT appointment, and questions were fielded to satisfaction by PAT clinical staff. He has been instructed on which medications that he will need to hold prior to surgery, as well as the ones that have been deemed safe/appropriate to take on the day of his procedure. As part of the general education provided by PAT, patient made aware both verbally and in writing, that he would need to abstain from the use of any illegal substances during his perioperative course.  He was advised that failure to follow the provided instructions could necessitate case cancellation or result  in serious perioperative complications up to and including death. Patient encouraged to contact PAT and/or his surgeon's office to discuss any questions or concerns that may arise prior to surgery; verbalized understanding.   Quentin Mulling, MSN, APRN, FNP-C, CEN [facility]  Peri-operative Services Nurse Practitioner Phone: [phone] Fax: [phone] 10/26/22 10:39 AM  NOTE: This note has been prepared using Dragon dictation software. Despite my best ability to proofread, there is always the potential that unintentional transcriptional errors may still occur from this process.

## 2022-10-29 ENCOUNTER — Encounter
Admission: RE | Disposition: A | Discharge status: Home / Self Care | Payer: Self-pay | Source: Home / Self Care | Attending: Pulmonary Disease

## 2022-10-29 ENCOUNTER — Ambulatory Visit: Payer: Medicare Other | Admitting: Urgent Care

## 2022-10-29 ENCOUNTER — Ambulatory Visit: Payer: Medicare Other

## 2022-10-29 ENCOUNTER — Ambulatory Visit
Admission: RE | Admit: 2022-10-29 | Discharge: 2022-10-29 | Disposition: A | Discharge status: Home / Self Care | Payer: Medicare Other | Attending: Pulmonary Disease | Admitting: Pulmonary Disease

## 2022-10-29 ENCOUNTER — Other Ambulatory Visit: Payer: Self-pay

## 2022-10-29 DIAGNOSIS — R918 Other nonspecific abnormal finding of lung field: Secondary | ICD-10-CM | POA: Diagnosis not present

## 2022-10-29 DIAGNOSIS — Z950 Presence of cardiac pacemaker: Secondary | ICD-10-CM | POA: Insufficient documentation

## 2022-10-29 DIAGNOSIS — I700 Atherosclerosis of aorta: Secondary | ICD-10-CM | POA: Insufficient documentation

## 2022-10-29 DIAGNOSIS — E11621 Type 2 diabetes mellitus with foot ulcer: Secondary | ICD-10-CM

## 2022-10-29 DIAGNOSIS — I509 Heart failure, unspecified: Secondary | ICD-10-CM | POA: Diagnosis not present

## 2022-10-29 DIAGNOSIS — E785 Hyperlipidemia, unspecified: Secondary | ICD-10-CM | POA: Diagnosis not present

## 2022-10-29 DIAGNOSIS — I110 Hypertensive heart disease with heart failure: Secondary | ICD-10-CM | POA: Insufficient documentation

## 2022-10-29 DIAGNOSIS — Z85828 Personal history of other malignant neoplasm of skin: Secondary | ICD-10-CM | POA: Diagnosis not present

## 2022-10-29 DIAGNOSIS — K219 Gastro-esophageal reflux disease without esophagitis: Secondary | ICD-10-CM | POA: Diagnosis not present

## 2022-10-29 DIAGNOSIS — K449 Diaphragmatic hernia without obstruction or gangrene: Secondary | ICD-10-CM | POA: Diagnosis not present

## 2022-10-29 DIAGNOSIS — I480 Paroxysmal atrial fibrillation: Secondary | ICD-10-CM | POA: Insufficient documentation

## 2022-10-29 DIAGNOSIS — Z01812 Encounter for preprocedural laboratory examination: Secondary | ICD-10-CM

## 2022-10-29 DIAGNOSIS — Z09 Encounter for follow-up examination after completed treatment for conditions other than malignant neoplasm: Secondary | ICD-10-CM | POA: Diagnosis not present

## 2022-10-29 DIAGNOSIS — M069 Rheumatoid arthritis, unspecified: Secondary | ICD-10-CM | POA: Diagnosis not present

## 2022-10-29 DIAGNOSIS — Z87891 Personal history of nicotine dependence: Secondary | ICD-10-CM | POA: Insufficient documentation

## 2022-10-29 DIAGNOSIS — J4489 Other specified chronic obstructive pulmonary disease: Secondary | ICD-10-CM | POA: Insufficient documentation

## 2022-10-29 DIAGNOSIS — Z48813 Encounter for surgical aftercare following surgery on the respiratory system: Secondary | ICD-10-CM | POA: Diagnosis not present

## 2022-10-29 DIAGNOSIS — I442 Atrioventricular block, complete: Secondary | ICD-10-CM | POA: Insufficient documentation

## 2022-10-29 DIAGNOSIS — Z7901 Long term (current) use of anticoagulants: Secondary | ICD-10-CM | POA: Insufficient documentation

## 2022-10-29 DIAGNOSIS — H409 Unspecified glaucoma: Secondary | ICD-10-CM | POA: Insufficient documentation

## 2022-10-29 DIAGNOSIS — I10 Essential (primary) hypertension: Secondary | ICD-10-CM | POA: Diagnosis not present

## 2022-10-29 DIAGNOSIS — Z9889 Other specified postprocedural states: Secondary | ICD-10-CM | POA: Insufficient documentation

## 2022-10-29 DIAGNOSIS — E1151 Type 2 diabetes mellitus with diabetic peripheral angiopathy without gangrene: Secondary | ICD-10-CM | POA: Insufficient documentation

## 2022-10-29 DIAGNOSIS — M810 Age-related osteoporosis without current pathological fracture: Secondary | ICD-10-CM | POA: Insufficient documentation

## 2022-10-29 DIAGNOSIS — C3412 Malignant neoplasm of upper lobe, left bronchus or lung: Secondary | ICD-10-CM | POA: Diagnosis not present

## 2022-10-29 DIAGNOSIS — I2510 Atherosclerotic heart disease of native coronary artery without angina pectoris: Secondary | ICD-10-CM | POA: Diagnosis not present

## 2022-10-29 DIAGNOSIS — R911 Solitary pulmonary nodule: Secondary | ICD-10-CM | POA: Diagnosis not present

## 2022-10-29 HISTORY — DX: Basal cell carcinoma of skin of nose: C44.311

## 2022-10-29 HISTORY — DX: Fatty (change of) liver, not elsewhere classified: K76.0

## 2022-10-29 HISTORY — DX: Rheumatoid lung disease with rheumatoid arthritis of unspecified site: M05.10

## 2022-10-29 HISTORY — DX: Atherosclerotic heart disease of native coronary artery without angina pectoris: I25.10

## 2022-10-29 HISTORY — PX: VIDEO BRONCHOSCOPY WITH ENDOBRONCHIAL ULTRASOUND: SHX6177

## 2022-10-29 HISTORY — DX: Basal cell carcinoma of skin of lip: C44.01

## 2022-10-29 HISTORY — DX: Long term (current) use of anticoagulants: Z79.01

## 2022-10-29 LAB — BODY FLUID CELL COUNT WITH DIFFERENTIAL
Eos, Fluid: 3 %
Lymphs, Fluid: 1 %
Monocyte-Macrophage-Serous Fluid: 4 % — ABNORMAL LOW (ref 50–90)
Neutrophil Count, Fluid: 92 % — ABNORMAL HIGH (ref 0–25)
Total Nucleated Cell Count, Fluid: 938 cu mm (ref 0–1000)

## 2022-10-29 LAB — GLUCOSE, CAPILLARY
Glucose-Capillary: 185 mg/dL — ABNORMAL HIGH (ref 70–99)
Glucose-Capillary: 113 mg/dL — ABNORMAL HIGH (ref 70–99)

## 2022-10-29 SURGERY — VIDEO BRONCHOSCOPY WITH ENDOBRONCHIAL ULTRASOUND
Anesthesia: General

## 2022-10-29 MED ORDER — PHENYLEPHRINE 80 MCG/ML (10ML) SYRINGE FOR IV PUSH (FOR BLOOD PRESSURE SUPPORT)
PREFILLED_SYRINGE | INTRAVENOUS | Status: DC | PRN
  Administered 2022-10-29: 80 ug via INTRAVENOUS

## 2022-10-29 MED ORDER — DEXAMETHASONE SODIUM PHOSPHATE 10 MG/ML IJ SOLN
INTRAMUSCULAR | Status: DC | PRN
  Administered 2022-10-29: 10 mg via INTRAVENOUS

## 2022-10-29 MED ORDER — PROPOFOL 10 MG/ML IV BOLUS
INTRAVENOUS | Status: DC | PRN
Start: 2022-10-29 — End: 2022-10-29
  Administered 2022-10-29: 200 mg via INTRAVENOUS

## 2022-10-29 MED ORDER — FAMOTIDINE 20 MG PO TABS: 20.0000 mg | ORAL_TABLET | Freq: Once | ORAL | Status: DC

## 2022-10-29 MED ORDER — ROCURONIUM BROMIDE 100 MG/10ML IV SOLN
INTRAVENOUS | Status: DC | PRN
  Administered 2022-10-29: 60 mg via INTRAVENOUS
  Administered 2022-10-29: 10 mg via INTRAVENOUS

## 2022-10-29 MED ORDER — ONDANSETRON HCL 4 MG/2ML IJ SOLN: 4.0000 mg | Freq: Once | INTRAMUSCULAR | Status: DC | PRN

## 2022-10-29 MED ORDER — ORAL CARE MOUTH RINSE: 15.0000 mL | Freq: Once | OROMUCOSAL | Status: DC

## 2022-10-29 MED ORDER — SUCCINYLCHOLINE CHLORIDE 200 MG/10ML IV SOSY
PREFILLED_SYRINGE | INTRAVENOUS | Status: DC | PRN
  Administered 2022-10-29: 100 mg via INTRAVENOUS

## 2022-10-29 MED ORDER — ONDANSETRON HCL 4 MG/2ML IJ SOLN
INTRAMUSCULAR | Status: DC | PRN
  Administered 2022-10-29 (×2): 4 mg via INTRAVENOUS

## 2022-10-29 MED ORDER — SODIUM CHLORIDE 0.9 % IV SOLN: INTRAVENOUS | Status: DC

## 2022-10-29 MED ORDER — EPHEDRINE SULFATE (PRESSORS) 50 MG/ML IJ SOLN
INTRAMUSCULAR | Status: DC | PRN
  Administered 2022-10-29: 5 mg via INTRAVENOUS

## 2022-10-29 MED ORDER — SUGAMMADEX SODIUM 200 MG/2ML IV SOLN
INTRAVENOUS | Status: DC | PRN
  Administered 2022-10-29: 300 mg via INTRAVENOUS

## 2022-10-29 MED ORDER — FENTANYL CITRATE (PF) 100 MCG/2ML IJ SOLN: 25.0000 ug | INTRAMUSCULAR | Status: DC | PRN

## 2022-10-29 MED ORDER — CHLORHEXIDINE GLUCONATE 0.12 % MT SOLN: 15.0000 mL | Freq: Once | OROMUCOSAL | Status: DC

## 2022-10-29 MED ORDER — LIDOCAINE HCL (CARDIAC) PF 100 MG/5ML IV SOSY
PREFILLED_SYRINGE | INTRAVENOUS | Status: DC | PRN
  Administered 2022-10-29: 100 mg via INTRAVENOUS

## 2022-10-29 MED ORDER — ACETAMINOPHEN 10 MG/ML IV SOLN
INTRAVENOUS | Status: DC | PRN
  Administered 2022-10-29: 1000 mg via INTRAVENOUS

## 2022-10-29 NOTE — Procedures (Signed)
ROBOTIC NAVIGATIONAL BRONCHOSCOPY PROCEDURE NOTE  FIBEROPTIC BRONCHOSCOPY WITH BRONCHOALVEOLAR LAVAGE and THERAPEUTIC ASPIRATION OF TRACHEOBRONCHIAL TREE PROCEDURE NOTE  ENDOBRONCHIAL ULTRASOUND PROCEDURE NOTE    Flexible bronchoscopy was performed  by : Karna Christmas MD  assistance by : 1)Repiratory therapist  and 2)[employer] staff and 3) Anesthesia team and 4) Flouroscopy team and 5) [facility] staff   Indication for the procedure was :  Pre-procedural H&P. The following assessment was performed on the day of the procedure prior to initiating sedation History:  Chest pain n Dyspnea y Hemoptysis n Cough y Fever n Other pertinent items n  Examination Vital signs -reviewed as per nursing documentation today Cardiac    Murmurs: n  Rubs : n  Gallop: n Lungs Wheezing: n Rales : n Rhonchi :y  Other pertinent findings: SOB/hypoxemia due to chronic lung disease   Pre-procedural assessment for Procedural Sedation included: Depth of sedation: As per anesthesia team  ASA Classification:  2 Mallampati airway assessment: 3    Medication list reviewed: y  The patient's interval history was taken and revealed: no new complaints The pre- procedure physical examination revealed: No new findings Refer to prior clinic note for details.  Informed Consent: Informed consent was obtained from:  patient after explanation of procedure and risks, benefits, as well as alternative procedures available.  Explanation of level of sedation and possible transfusion was also provided.    Procedural Preparation: Time out was performed and patient was identified by name and birthdate and procedure to be performed and side for sampling, if any, was specified. Pt was intubated by anesthesia.  The patient was appropriately draped.   Fiberoptic bronchoscopy with airway inspection and BAL Procedure findings:  Bronchoscope was inserted via ETT  without difficulty.  Posterior oropharynx,  epiglottis, arytenoids, false cords and vocal cords were not visualized as these were bypassed by endotracheal tube. The distal trachea was normal in circumference and appearance without mucosal, cartilaginous or branching abnormalities.  The main carina was mildly splayed . All right and left lobar airways were visualized to the Subsegmental level.  Sub- sub segmental carinae were identified in all the distal airways.   Secretions were visible in the following airways and appeared to be clear.  The mucosa was : friable at left upper lobe  Airways were notable for:        exophytic lesions :n       extrinsic compression in the following distributions: n.       Friable mucosa: y       Teacher, music /pigmentation: n    MUCUS PLUGGING OF BILATERAL AIRWAYS NOTED.  WE COULD NOT VISUALIZE AIRWAYS OF INTEREST.  THERAPEUTIC ASPIRATION WAS PERFORMED AT BOTH LEFT AND RIGHT LUNGS IN ALL LOBES DUE TO SEVERITY OF MUCUS PLUGGING.  THIS REQUIRED INTERRUPTION OF PROCEDURE DUE TO NUMEROUS ASPIRATIONS AND CLEARANCE OF INSTRUMENTATION.      Post procedure Diagnosis:   SEVERE MUCUS PLUGGING     Electromagnetic Navigational Bronchoscopy Procedure Findings:   Post appropriate planning and registration peripheral navigation was used to visualize target lesion.      BAL WAS PERFORMED AT LEFT UPPER LOBE.  LEFT UPPER LOBE SUBSEGMENTAL AIRWAY ANTERIORLY APPEARED EXTRINSICALLY COMPRESSED AND EDEMATOUS.  IT WAS DIFFICULT TO GET ROBOTIC SCOPE INTO THIS AREA.   INTERVENTIONS: CYTOBRUSH X 2 - ENDOBRONCHIAL - IN CYTOLOGY BAL - CYTOLOGY SURGICAL PATHOLOGY - ENDOBRONCHIAL X 5 -LESIONAL CARCINOMA  Post procedure diagnosis: LUNG CANCER      Endobronchial ultrasound assisted hilar and mediastinal lymph  node biopsies procedure findings: The fiberoptic bronchoscope was removed and the EBUS scope was introduced. Examination began to evaluate for pathologically enlarged lymph nodes starting on the RIGHT  side  progressing to the LEFT side.  All lymph node biopsies performed with 21G needle. Lymph node biopsies were sent in cytolite for all stations.   LYMPH NODE STATION 10R - 5mm not biopsied LYMPH NODE STATION 7 - 7mm not biopsied LYMPH NODE STATION 10L - 6mm -not biopsied LYMPH NODE STATION 4L- 5mm - not biosied  Post procedure diagnosis:  normal lymph node station size   Specimens obtained included:  Bronchial washings site: bilateral  sent for: microbiology                                                 Cytology brushes : LUL - cytology   Broncho-alveolar lavage site:LUL  sent for micro and cytology                              45ml volume infused 30 ml volume returned with cellular mucopurulent appearance   Immediate sampling complications included:NONE  Epinephrine NONE ml was used topically  The bronchoscopy was terminated due to completion of the planned procedure and the bronchoscope was removed.   Total dosage of Lidocaine was NONE mg Total fluoroscopy time was AS PER RADIOLOGY minutes  Supplemental oxygen was provided at AS PER ANESTHESIA lpm by nasal canula post operatively  Estimated Blood loss: EXPECTED <5cc.  Complications included:  NONE IMMEDIATE   Preliminary CXR findings :  IN PROCESS  Disposition: HOME WITH WIFE  Follow up with Dr. Karna Christmas in 5 days for result discussion.     Vida Rigger MD  [facility] Division of Pulmonary & Critical Care Medicine

## 2022-10-29 NOTE — Anesthesia Preprocedure Evaluation (Signed)
Anesthesia Evaluation  Patient identified by MRN, date of birth, ID band Patient awake    Reviewed: Allergy & Precautions, H&P , NPO status , Patient's Chart, lab work & pertinent test results, reviewed documented beta blocker date and time   History of Anesthesia Complications Negative for: history of anesthetic complications  Airway Mallampati: IV  TM Distance: >3 FB Neck ROM: full  Mouth opening: Limited Mouth Opening  Dental  (+) Edentulous Upper, Edentulous Lower   Pulmonary shortness of breath and with exertion, sleep apnea , COPD,  COPD inhaler, neg recent URI, former smoker   Pulmonary exam normal breath sounds clear to auscultation       Cardiovascular Exercise Tolerance: Good hypertension, (-) angina + CAD and + Peripheral Vascular Disease  (-) Past MI and (-) Cardiac Stents Normal cardiovascular exam+ dysrhythmias (complete heart block) + pacemaker (-) Valvular Problems/Murmurs Rhythm:regular Rate:Normal  ECHO 2022: MILD SEGMENTAL LV SYSTOLIC DYSFUNCTION WITH AN ESTIMATED EF = 45 %  NORMAL RIGHT VENTRICULAR SYSTOLIC FUNCTION  MILD VALVULAR REGURGITATION (See above)  NO VALVULAR STENOSIS  PACER WIRE NOTED  APICAL INFERIOR AND SEPTAL WALL HYPOKINESIS     Neuro/Psych negative neurological ROS  negative psych ROS   GI/Hepatic Neg liver ROS, hiatal hernia,GERD  ,,  Endo/Other  diabetes    Renal/GU negative Renal ROS  negative genitourinary   Musculoskeletal   Abdominal   Peds  Hematology negative hematology ROS (+)   Anesthesia Other Findings Past Medical History: No date: Aortic atherosclerosis (HCC) No date: Basal cell carcinoma of skin of lip No date: Basal cell carcinoma of skin of nose No date: CAD (coronary artery disease) No date: Cardiogenic shock (HCC) No date: Chronic bronchitis (HCC) No date: Community acquired pneumonia No date: COPD (chronic obstructive pulmonary disease)  (HCC) 02/17/2021: Diastolic dysfunction     Comment:  a.) TTE 02/17/2021: EF 45%, sep/apical/inf HK, mild RVE,              triv PR, mild MR/TR, RVSP 36.4, G1DD No date: 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)     Comment:  a.) s/p surgery 10/02/2013 --> bronchoscopy + RIGHT VATS               with diagnostic RUL/RML/RLL wedge resections + RIGHT               pleural biopsy, and partial decortication --> pathology               consistent with rheumatoid lung disease No date: Elevated liver enzymes     Comment:  a.) secondary to use of MTX for RA diagnosis No date: Glaucoma No date: Hearing loss No date: Hepatic steatosis No date: History of hiatal hernia No date: Hyperlipidemia No date: Hypertension No date: Laryngopharyngeal reflux No date: Mass of upper lobe of left lung No date: On apixaban therapy No date: Osteoporosis No date: Paroxysmal atrial fibrillation (HCC)     Comment:  a.) CHA2DS2VASc = 4 (age, HTN, vascular disease history,              T2DM);  b.) rate/rhythm maintained intrisically without               phamacological intervention; chronically anticoagulated               with apixaban 10/28/2017: 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)     Comment:  a.) MDT Azure XT DR MRI SureScan dual chamber PPM (SN:  [id number]) No date: Rheumatoid arthritis (HCC) No date: Rosacea, acne No date: Sepsis (HCC) No date: Shortness of breath No date: Third degree AV block (HCC)     Comment:  a.) s/p MDT dual chamber PPM placement 10/28/2017 No date: Type 2 diabetes mellitus with peripheral angiopathy (HCC)   Reproductive/Obstetrics negative OB ROS                             Anesthesia Physical Anesthesia Plan  ASA: 3  Anesthesia Plan: General   Post-op Pain Management:    Induction: Intravenous  PONV Risk Score and Plan: 2 and Ondansetron, Dexamethasone and Treatment may vary due to age or medical  condition  Airway Management Planned: Oral ETT  Additional Equipment:   Intra-op Plan:   Post-operative Plan: Extubation in OR  Informed Consent: I have reviewed the patients History and Physical, chart, labs and discussed the procedure including the risks, benefits and alternatives for the proposed anesthesia with the patient or authorized representative who has indicated his/her understanding and acceptance.     Dental Advisory Given  Plan Discussed with: Anesthesiologist, CRNA and Surgeon  Anesthesia Plan Comments:         Anesthesia Quick Evaluation

## 2022-10-29 NOTE — Transfer of Care (Signed)
Immediate Anesthesia Transfer of Care Note  Patient: Quenton Fetter  Procedure(s) Performed: VIDEO BRONCHOSCOPY WITH ENDOBRONCHIAL ULTRASOUND ROBOTIC ASSISTED NAVIGATIONAL BRONCHOSCOPY  Patient Location: PACU  Anesthesia Type:General  Level of Consciousness: awake, drowsy, and patient cooperative  Airway & Oxygen Therapy: Patient Spontanous Breathing and Patient connected to face mask oxygen  Post-op Assessment: Report given to RN and Post -op Vital signs reviewed and stable  Post vital signs: Reviewed and stable  Last Vitals:  Vitals Value Taken Time  BP 141/57 10/29/22 1445  Temp    Pulse 80 10/29/22 1446  Resp 17 10/29/22 1446  SpO2 100 % 10/29/22 1446  Vitals shown include unfiled device data.  Last Pain:  Vitals:   10/29/22 1122  TempSrc: Oral  PainSc: 0-No pain         Complications: No notable events documented.

## 2022-10-29 NOTE — Discharge Instructions (Addendum)
AMBULATORY SURGERY  DISCHARGE INSTRUCTIONS  The drugs that you were given will stay in your system until tomorrow so for the next 24 hours you should not:  Drive an automobile Make any legal decisions Drink any alcoholic beverage  You may resume regular meals tomorrow.  Today it is better to start with liquids and gradually work up to solid foods.  You may eat anything you prefer, but it is better to start with liquids, then soup and crackers, and gradually work up to solid foods.  Please notify your doctor immediately if you have any unusual bleeding, trouble breathing, redness and pain at the surgery site, drainage, fever, or pain not relieved by medication.  Additional Instructions:  Please contact your physician with any problems or Same Day Surgery at [phone], Monday through Friday 6 am to 4 pm, or [place] [facility] number at [phone].   bronchoscopy

## 2022-10-29 NOTE — H&P (Signed)
PULMONOLOGY         Date: 10/29/2022,   MRN# [id number] Darryl Jones 06/22/1955     AdmissionWeight: 87.1 kg                 CurrentWeight: 87.1 kg  Referring provider: Dr Meredeth Ide    CHIEF COMPLAINT:   Lung mass   HISTORY OF PRESENT ILLNESS   67 yo with hx of basal cell cancer , CHF, COPD bronchitis, he does have AF and is on eliquis but stopped it 1 week ago.  He had cardiology clearance for procedure. He is here today due to abnormal lesion of left lung.  He is planning on having airway inspection with flexible bronchoscopy, navigational bronch for tissue diagnosis.    His lesion is at fissure of left lung which predisposes pneumothroax upon biopsy.  He feel that the biopsy is important and wants to proceed with biopsy even with risk of death.   We discussed procedure complications.  Reviewed risks/complications and benefits with patient, risks include infection, pneumothorax/pneumomediastinum which may require chest tube placement as well as overnight/prolonged hospitalization and possible mechanical ventilation. Other risks include bleeding and very rarely death.  Patient understands risks and wishes to proceed.  Additional questions were answered, and patient is aware that post procedure patient will be going home with family and may experience cough with possible clots on expectoration as well as phlegm which may last few days as well as hoarseness of voice post intubation and mechanical ventilation.    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)    a.) s/p surgery 10/02/2013 --> bronchoscopy + RIGHT VATS with diagnostic RUL/RML/RLL wedge resections + RIGHT pleural biopsy, and partial decortication --> pathology consistent with rheumatoid lung disease   Elevated liver enzymes    a.) secondary to use of MTX for RA diagnosis   Glaucoma    Hearing loss    Hepatic steatosis    History of hiatal hernia    Hyperlipidemia    Hypertension    Laryngopharyngeal reflux    Mass of upper lobe of left lung    On apixaban therapy    Osteoporosis    Paroxysmal atrial fibrillation (HCC)    a.) CHA2DS2VASc = 4 (age, HTN, vascular disease history, T2DM);  b.) rate/rhythm maintained intrisically without phamacological intervention; chronically anticoagulated with apixaban   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) 10/28/2017   a.) MDT Azure XT DR MRI SureScan dual chamber PPM (SN: [id number])   Rheumatoid arthritis (HCC)    Rosacea, acne    Sepsis (HCC)    Shortness of breath    Third degree AV block (HCC)    a.) s/p MDT dual chamber PPM placement 10/28/2017   Type 2 diabetes mellitus with peripheral angiopathy (HCC)      SURGICAL HISTORY   Past Surgical History:  Procedure Laterality Date   ABDOMINAL EXPLORATION SURGERY  1966  undescended testicle; explore for cancer   GLAUCOMA SURGERY Right 01/2022   laser (did not take)   HIATAL HERNIA REPAIR  2000   KNEE SURGERY Left 1995   knee cap shattered; wire in place   LUNG SURGERY Right    abscess lung biopsy (right lower)   PACEMAKER INSERTION Left 10/28/2017   Procedure: INSERTION PACEMAKER-INITIAL INSERT DUAL CHAMBER;  Surgeon: Marcina Millard, MD;  Location: [facility] ORS;  Service: Cardiovascular;  Laterality: Left;   SUBMANDIBULAR GLAND EXCISION Right 01/07/2013   Dr Mahlon Gammon GLAND EXCISION Right 01/07/2013   Procedure: REMOVAL OF RIGHT SUBMANDIBULAR GLAND;  Surgeon: Serena Colonel,  MD;  Location: [facility] OR;  Service: ENT;  Laterality: Right;   TONSILLECTOMY     child     FAMILY HISTORY   Family History  Problem Relation Age of Onset   Diabetes Sister    Heart disease Sister    Kidney disease Sister    Diabetes Brother      SOCIAL HISTORY   Social History   Tobacco Use   Smoking status: Former    Current packs/day: 0.00    Types: Cigarettes    Quit date: 06/08/2010    Years since quitting: 12.4   Smokeless tobacco: Never   Tobacco comments:    "quit smoking cigarettes in 2012"  Vaping Use   Vaping status: Never Used  Substance Use Topics   Alcohol use: No   Drug use: No     MEDICATIONS    Home Medication:    Current Medication:  Current Facility-Administered Medications:    0.9 %  sodium chloride infusion, , Intravenous, Continuous, Adams, Currie Paris, MD   chlorhexidine (PERIDEX) 0.12 % solution 15 mL, 15 mL, Mouth/Throat, Once **OR** Oral care mouth rinse, 15 mL, Mouth Rinse, Once, Yevette Edwards, MD   famotidine (PEPCID) tablet 20 mg, 20 mg, Oral, Once, Verlee Monte, NP    ALLERGIES   Hydroxyquinolines, Loratadine, Other, Adalimumab, Hydroxychloroquine, Leflunomide, Methotrexate, Methotrexate derivatives, Aspirin, and Protonix [pantoprazole sodium]     REVIEW OF SYSTEMS    Review of Systems:  Gen:  Denies  fever, sweats, chills weigh loss  HEENT: Denies blurred vision, double vision, ear pain, eye pain, hearing loss, nose bleeds, sore throat Cardiac:  No dizziness, chest pain or heaviness, chest tightness,edema Resp:   reports dyspnea chronically  Gi: Denies swallowing difficulty, stomach pain, nausea or vomiting, diarrhea, constipation, bowel incontinence Gu:  Denies bladder incontinence, burning urine Ext:   Denies Joint pain, stiffness or swelling Skin: Denies  skin rash, easy bruising or bleeding or hives Endoc:  Denies polyuria, polydipsia , polyphagia or weight change Psych:   Denies depression, insomnia or hallucinations    Other:  All other systems negative   VS: BP 134/72   Pulse 78   Temp 97.8 F (36.6 C) (Oral)   Resp 16   Ht 5\' 6"  (1.676 m)   Wt 87.1 kg   SpO2 99%   BMI 30.99 kg/m      PHYSICAL EXAM    GENERAL:NAD, no fevers, chills, no weakness no fatigue HEAD: Normocephalic, atraumatic.  EYES: Pupils equal, round, reactive to light. Extraocular muscles intact. No scleral icterus.  MOUTH: Moist mucosal membrane. Dentition intact. No abscess noted.  EAR, NOSE, THROAT: Clear without exudates. No external lesions.  NECK: Supple. No thyromegaly. No nodules. No JVD.  PULMONARY: decreased breath sounds with mild rhonchi worse at bases bilaterally.  CARDIOVASCULAR: S1 and S2. Regular rate and rhythm. No  murmurs, rubs, or gallops. No edema. Pedal pulses 2+ bilaterally.  GASTROINTESTINAL: Soft, nontender, nondistended. No masses. Positive bowel sounds. No hepatosplenomegaly.  MUSCULOSKELETAL: No swelling, clubbing, or edema. Range of motion full in all extremities.  NEUROLOGIC: Cranial nerves II through XII are intact. No gross focal neurological deficits. Sensation intact. Reflexes intact.  SKIN: No ulceration, lesions, rashes, or cyanosis. Skin warm and dry. Turgor intact.  PSYCHIATRIC: Mood, affect within normal limits. The patient is awake, alert and oriented x 3. Insight, judgment intact.       IMAGING   CT chest today reviewed with left lung lesion   ASSESSMENT/PLAN    Irregular left upper lobe lesion.  Non resolved after multiple treatments for infection  -possible cancer Here for tissue sampling with bronchoscopy I suspect there is mucus plugging and we will perform theraputic aspiration and BAL for infectious etiology.  WE plan to use robotic bronchospy with [facility] platform for lung biopsy, FNA and brushings.     -Reviewed risks/complications and benefits with patient, risks include infection, pneumothorax/pneumomediastinum which may require chest tube placement as well as  overnight/prolonged hospitalization and possible mechanical ventilation. Other risks include bleeding and very rarely death.  Patient understands risks and wishes to proceed.  Additional questions were answered, and patient is aware that post procedure patient will be going home with family and may experience cough with possible clots on expectoration as well as phlegm which may last few days as well as hoarseness of voice post intubation and mechanical ventilation.            Thank you for allowing me to participate in the care of this patient.   Patient/Family are satisfied with care plan and all questions have been answered.    Provider disclosure: Patient with at least one acute or chronic illness or injury that poses a threat to life or bodily function and is being managed actively during this encounter.  All of the below services have been performed independently by signing provider:  review of prior documentation from internal and or external health records.  Review of previous and current lab results.  Interview and comprehensive assessment during patient visit today. Review of current and previous chest radiographs/CT scans. Discussion of management and test interpretation with health care team and patient/family.   This document was prepared using Dragon voice recognition software and may include unintentional dictation errors.     Vida Rigger, M.D.  Division of Pulmonary & Critical Care Medicine

## 2022-10-29 NOTE — Anesthesia Procedure Notes (Signed)
Procedure Name: Intubation Date/Time: 10/29/2022 1:14 PM  Performed by: Mohammed Kindle, CRNAPre-anesthesia Checklist: Patient identified, Emergency Drugs available, Suction available and Patient being monitored Patient Re-evaluated:Patient Re-evaluated prior to induction Oxygen Delivery Method: Circle system utilized Preoxygenation: Pre-oxygenation with 100% oxygen Induction Type: IV induction Ventilation: Mask ventilation without difficulty Laryngoscope Size: McGraph and 3 Grade View: Grade I Tube type: Oral Tube size: 9.0 mm Number of attempts: 1 Airway Equipment and Method: Stylet Placement Confirmation: ETT inserted through vocal cords under direct vision, positive ETCO2, breath sounds checked- equal and bilateral and CO2 detector Secured at: 21 cm Tube secured with: Tape Dental Injury: Teeth and Oropharynx as per pre-operative assessment

## 2022-10-30 ENCOUNTER — Encounter: Payer: Self-pay | Admitting: Pulmonary Disease

## 2022-10-30 NOTE — Anesthesia Postprocedure Evaluation (Signed)
Anesthesia Post Note  Patient: Darryl Jones  Procedure(s) Performed: VIDEO BRONCHOSCOPY WITH ENDOBRONCHIAL ULTRASOUND ROBOTIC ASSISTED NAVIGATIONAL BRONCHOSCOPY  Patient location during evaluation: PACU Anesthesia Type: General Level of consciousness: awake and alert Pain management: pain level controlled Vital Signs Assessment: post-procedure vital signs reviewed and stable Respiratory status: spontaneous breathing, nonlabored ventilation and respiratory function stable Cardiovascular status: blood pressure returned to baseline and stable Postop Assessment: no apparent nausea or vomiting Anesthetic complications: no   No notable events documented.   Last Vitals:  Vitals:   10/29/22 1602 10/29/22 1646  BP: (!) 151/53 (!) 145/63  Pulse: 64   Resp: 18 16  Temp: (!) 36.1 C (!) 36.2 C  SpO2: 95% 96%    Last Pain:  Vitals:   10/29/22 1646  TempSrc:   PainSc: 0-No pain                 Foye Deer

## 2022-10-31 ENCOUNTER — Encounter: Payer: Self-pay | Admitting: Pulmonary Disease

## 2022-10-31 DIAGNOSIS — I700 Atherosclerosis of aorta: Secondary | ICD-10-CM | POA: Diagnosis not present

## 2022-10-31 DIAGNOSIS — Z0000 Encounter for general adult medical examination without abnormal findings: Secondary | ICD-10-CM | POA: Diagnosis not present

## 2022-10-31 DIAGNOSIS — E1122 Type 2 diabetes mellitus with diabetic chronic kidney disease: Secondary | ICD-10-CM | POA: Diagnosis not present

## 2022-10-31 DIAGNOSIS — J449 Chronic obstructive pulmonary disease, unspecified: Secondary | ICD-10-CM | POA: Diagnosis not present

## 2022-10-31 DIAGNOSIS — I480 Paroxysmal atrial fibrillation: Secondary | ICD-10-CM | POA: Diagnosis not present

## 2022-10-31 DIAGNOSIS — D6869 Other thrombophilia: Secondary | ICD-10-CM | POA: Diagnosis not present

## 2022-10-31 DIAGNOSIS — C4491 Basal cell carcinoma of skin, unspecified: Secondary | ICD-10-CM | POA: Diagnosis not present

## 2022-10-31 DIAGNOSIS — E782 Mixed hyperlipidemia: Secondary | ICD-10-CM | POA: Diagnosis not present

## 2022-10-31 DIAGNOSIS — E1142 Type 2 diabetes mellitus with diabetic polyneuropathy: Secondary | ICD-10-CM | POA: Diagnosis not present

## 2022-10-31 DIAGNOSIS — M069 Rheumatoid arthritis, unspecified: Secondary | ICD-10-CM | POA: Diagnosis not present

## 2022-10-31 DIAGNOSIS — N182 Chronic kidney disease, stage 2 (mild): Secondary | ICD-10-CM | POA: Diagnosis not present

## 2022-11-01 LAB — CULTURE, BAL-QUANTITATIVE W GRAM STAIN: Culture: NO GROWTH

## 2022-11-01 LAB — ASPERGILLUS ANTIGEN, BAL/SERUM: Aspergillus Ag, BAL/Serum: 0.07 {index} (ref 0.00–0.49)

## 2022-11-19 LAB — CULTURE, FUNGUS WITHOUT SMEAR

## 2022-11-21 DIAGNOSIS — J849 Interstitial pulmonary disease, unspecified: Secondary | ICD-10-CM | POA: Diagnosis not present

## 2022-11-22 ENCOUNTER — Ambulatory Visit (INDEPENDENT_AMBULATORY_CARE_PROVIDER_SITE_OTHER): Payer: Medicare Other | Admitting: Podiatry

## 2022-11-22 DIAGNOSIS — Z8631 Personal history of diabetic foot ulcer: Secondary | ICD-10-CM | POA: Diagnosis not present

## 2022-11-22 DIAGNOSIS — E119 Type 2 diabetes mellitus without complications: Secondary | ICD-10-CM

## 2022-11-22 DIAGNOSIS — M216X2 Other acquired deformities of left foot: Secondary | ICD-10-CM

## 2022-11-22 DIAGNOSIS — Q828 Other specified congenital malformations of skin: Secondary | ICD-10-CM | POA: Diagnosis not present

## 2022-11-22 DIAGNOSIS — E1142 Type 2 diabetes mellitus with diabetic polyneuropathy: Secondary | ICD-10-CM

## 2022-11-22 DIAGNOSIS — B351 Tinea unguium: Secondary | ICD-10-CM | POA: Diagnosis not present

## 2022-11-22 DIAGNOSIS — M79676 Pain in unspecified toe(s): Secondary | ICD-10-CM | POA: Diagnosis not present

## 2022-11-22 DIAGNOSIS — M216X1 Other acquired deformities of right foot: Secondary | ICD-10-CM | POA: Diagnosis not present

## 2022-11-22 NOTE — Progress Notes (Signed)
ANNUAL DIABETIC FOOT EXAM  Subjective: Darryl Jones presents today annual diabetic foot exam.  Chief Complaint  Patient presents with   Nail Problem    DFC,Referring Provider Georgann Housekeeper, MD,lov:08/24,A1C:7.6,BS:147 today      Patient confirms h/o diabetes.  Patient has h/o diabetic foot ulcer.  Patient has been diagnosed with neuropathy.  Risk factors: diabetes, diabetic neuropathy, history of foot/leg ulcer, HTN, COPD, hyperlipidemia, h/o tobacco use in remission, RA.  Georgann Housekeeper, MD is patient's PCP.  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)    a.) s/p surgery 10/02/2013 --> bronchoscopy + RIGHT VATS with diagnostic RUL/RML/RLL wedge resections + RIGHT pleural biopsy, and partial decortication --> pathology consistent with rheumatoid lung disease   Elevated liver enzymes    a.) secondary to use of MTX for RA diagnosis   Glaucoma    Hearing loss    Hepatic steatosis    History of hiatal hernia    Hyperlipidemia    Hypertension    Laryngopharyngeal reflux    Mass of upper lobe of left lung    On apixaban therapy    Osteoporosis    Paroxysmal atrial fibrillation (HCC)    a.) CHA2DS2VASc = 4 (age, HTN, vascular disease history, T2DM);  b.) rate/rhythm maintained intrisically without phamacological intervention; chronically anticoagulated with apixaban   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) 10/28/2017   a.) MDT Azure XT DR MRI SureScan dual chamber PPM (SN: [id number])    Rheumatoid arthritis (HCC)    Rosacea, acne    Sepsis (HCC)    Shortness of breath    Third degree AV block (HCC)    a.) s/p MDT dual chamber PPM placement 10/28/2017   Type 2 diabetes mellitus with peripheral angiopathy (HCC)    Patient Active Problem List   Diagnosis Date Noted   Acne rosacea 05/22/2021   Basal cell carcinoma of skin 05/22/2021   Gastroesophageal reflux disease 05/22/2021   Glaucoma 05/22/2021   Globus sensation 05/22/2021   Hardening of the aorta (main artery of the heart) (HCC) 05/22/2021   Hearing loss 05/22/2021   Non-pressure chronic ulcer of other part of unspecified foot with unspecified severity (HCC) 05/22/2021   Obstructive sleep apnea 05/22/2021   Osteoporosis 05/22/2021   Other specified abnormal findings of blood chemistry 05/22/2021   Pharyngeal dysphagia 05/22/2021   Polyneuropathy due to type 2 diabetes mellitus (HCC) 05/22/2021   Rheumatoid arthritis (HCC) 05/22/2021   Rheumatoid lung disease (HCC) 05/22/2021   Type 2 diabetes mellitus with foot ulcer (CODE) (HCC) 05/22/2021   Abnormal stress test 04/06/2021   Exertional dyspnea 04/06/2021   CAP (community acquired pneumonia) 11/23/2019   Atrial fibrillation, chronic (HCC) 11/23/2019   COPD (chronic obstructive pulmonary disease) (HCC) 11/23/2019   Sepsis (HCC) 11/23/2019   Rheumatoid arteritis (HCC) 11/23/2019   Preoperative cardiovascular examination 04/29/2018   Pharyngoesophageal dysphagia 01/01/2018   Atrial fibrillation, new onset (HCC) 12/02/2017   Palpitation 11/22/2017   Hyperlipidemia 11/05/2017  Hypertension 11/05/2017   Pacemaker 11/05/2017   Cardiogenic shock (HCC)    Third degree AV block (HCC) 10/27/2017   Burn of oral mucosa 06/06/2017   Laryngopharyngeal reflux (LPR) 06/06/2017   Past Surgical History:  Procedure Laterality Date   ABDOMINAL EXPLORATION SURGERY  1966   undescended testicle; explore for cancer   GLAUCOMA SURGERY Right 01/2022   laser (did not take)    HIATAL HERNIA REPAIR  2000   KNEE SURGERY Left 1995   knee cap shattered; wire in place   LUNG SURGERY Right    abscess lung biopsy (right lower)   PACEMAKER INSERTION Left 10/28/2017   Procedure: INSERTION PACEMAKER-INITIAL INSERT DUAL CHAMBER;  Surgeon: Marcina Millard, MD;  Location: [facility];  Service: Cardiovascular;  Laterality: Left;   SUBMANDIBULAR GLAND EXCISION Right 01/07/2013   Dr Mahlon Gammon GLAND EXCISION Right 01/07/2013   Procedure: REMOVAL OF RIGHT SUBMANDIBULAR GLAND;  Surgeon: Serena Colonel, MD;  Location: [facility] OR;  Service: ENT;  Laterality: Right;   TONSILLECTOMY     child   VIDEO BRONCHOSCOPY WITH ENDOBRONCHIAL ULTRASOUND N/A 10/29/2022   Procedure: VIDEO BRONCHOSCOPY WITH ENDOBRONCHIAL ULTRASOUND;  Surgeon: Vida Rigger, MD;  Location: [facility] ORS;  Service: Thoracic;  Laterality: N/A;   Current Outpatient Medications on File Prior to Visit  Medication Sig Dispense Refill   acetaminophen (TYLENOL) 650 MG CR tablet Take 1,300 mg by mouth 2 (two) times daily.     Albuterol Sulfate (PROAIR RESPICLICK) 108 (90 Base) MCG/ACT AEPB Inhale 2 puffs into the lungs every 6 (six) hours as needed (respiratory difficulties).     atorvastatin (LIPITOR) 20 MG tablet Take 20 mg by mouth daily.     brimonidine (ALPHAGAN) 0.2 % ophthalmic solution Place 1 drop into both eyes 2 (two) times daily.     Cholecalciferol (VITAMIN D3) 10 MCG (400 UNIT) CAPS Take 1 capsule by mouth daily.     ELIQUIS 5 MG TABS tablet Take 5 mg by mouth 2 (two) times daily.     Fluticasone-Umeclidin-Vilant (TRELEGY ELLIPTA) 100-62.5-25 MCG/ACT AEPB Inhale 1 puff into the lungs daily.     glimepiride (AMARYL) 1 MG tablet Take 1 mg by mouth daily with breakfast.     ipratropium-albuterol (DUONEB) 0.5-2.5 (3) MG/3ML SOLN Take 3 mLs by nebulization every 6 (six) hours as needed.     latanoprost (XALATAN) 0.005 % ophthalmic solution Place 1 drop into both eyes at bedtime.      No current  facility-administered medications on file prior to visit.    Allergies  Allergen Reactions   Hydroxyquinolines Anaphylaxis   Loratadine Anaphylaxis and Other (See Comments)   Other Anaphylaxis and Other (See Comments)   Adalimumab Other (See Comments)    Caused an abscess in lung   Hydroxychloroquine Other (See Comments)    Elevated LFTs   Leflunomide Other (See Comments)    Elevated LFT's   Methotrexate Other (See Comments)   Methotrexate Derivatives Other (See Comments)    Elevated lft's   Aspirin Rash and Other (See Comments)   Protonix [Pantoprazole Sodium] Nausea Only   Social History   Occupational History   Not on file  Tobacco Use   Smoking status: Former    Current packs/day: 0.00    Types: Cigarettes    Quit date: 06/08/2010    Years since quitting: 12.4   Smokeless tobacco: Never   Tobacco comments:    "quit smoking cigarettes in 2012"  Vaping Use   Vaping status: Never Used  Substance  and Sexual Activity   Alcohol use: No   Drug use: No   Sexual activity: Not on file   Family History  Problem Relation Age of Onset   Diabetes Sister    Heart disease Sister    Kidney disease Sister    Diabetes Brother    Immunization History  Administered Date(s) Administered   Influenza Split 12/27/2008, 12/09/2009, 01/14/2012, 01/27/2013   Influenza,inj,Quad PF,6+ Mos 01/03/2016, 01/02/2017, 01/07/2018   Moderna Sars-Covid-2 Vaccination 05/30/2019, 06/27/2019   Pneumococcal Polysaccharide-23 03/12/2006, 09/18/2019   Td 03/13/1999   Tdap 09/19/2010, 09/22/2020   Zoster, Live 02/15/2014     Review of Systems: Negative except as noted in the HPI.   Objective: There were no vitals filed for this visit.  Darryl Jones is a pleasant 67 y.o. male in NAD. AAO X 3.  Vascular Examination: CFT <3 seconds b/l. DP/PT pulses faintly palpable b/l. Skin temperature gradient warm to warm b/l. No pain with calf compression. No ischemia or gangrene. No cyanosis or clubbing  noted b/l. Trace edema noted BLE.   Neurological Examination: Sensation grossly intact b/l with 10 gram monofilament. Vibratory sensation intact b/l. Pt has subjective symptoms of neuropathy.  Dermatological Examination: Pedal skin warm and supple b/l.   No open wounds. No interdigital macerations.  Toenails 1-5 b/l thick, discolored, elongated with subungual debris and pain on dorsal palpation.    Hyperkeratotic lesion(s) bilateral heels and left great toe.  No erythema, no edema, no drainage, no fluctuance. Porokeratotic lesion(s) submet head 4 left foot. No erythema, no edema, no drainage, no fluctuance.  Musculoskeletal Examination: Muscle strength 5/5 to all lower extremity muscle groups bilaterally. Pes planus deformity noted bilateral LE.  Radiographs: None  Lab Results  Component Value Date   HGBA1C 7.6 (H) 11/23/2019   ADA Risk Categorization: High Risk  Patient has one or more of the following: Loss of protective sensation Absent pedal pulses Severe Foot deformity History of foot ulcer  Assessment: 1. Pain due to onychomycosis of toenail   2. Porokeratosis   3. Plantarflexion deformity of both feet   4. History of diabetic ulcer of foot   5. Diabetic polyneuropathy associated with type 2 diabetes mellitus (HCC)   6. Encounter for diabetic foot exam (HCC)     Plan: -Consent given for treatment as described below: -Examined patient. -Diabetic foot examination performed today. -Continue diabetic foot care principles: inspect feet daily, monitor glucose as recommended by PCP and/or Endocrinologist, and follow prescribed diet per PCP, Endocrinologist and/or dietician. -Continue supportive shoe gear daily. -Mycotic toenails 1-5 bilaterally were debrided in length and girth with sterile nail nippers and dremel without incident. -Callus(es) bilateral heels and left great toe pared utilizing sharp debridement with sterile blade without complication or incident. Total  number debrided =3. -Porokeratotic lesion(s) submet head 4 left foot pared and enucleated with sterile currette without incident. Total number of lesions debrided=1. -Patient/POA to call should there be question/concern in the interim. Return in about 3 months (around 02/21/2023).  Freddie Breech, DPM

## 2022-11-26 ENCOUNTER — Encounter: Payer: Self-pay | Admitting: Podiatry

## 2022-12-12 DIAGNOSIS — I2510 Atherosclerotic heart disease of native coronary artery without angina pectoris: Secondary | ICD-10-CM | POA: Diagnosis not present

## 2022-12-12 DIAGNOSIS — M1990 Unspecified osteoarthritis, unspecified site: Secondary | ICD-10-CM | POA: Diagnosis not present

## 2022-12-12 DIAGNOSIS — Z79899 Other long term (current) drug therapy: Secondary | ICD-10-CM | POA: Diagnosis not present

## 2022-12-12 DIAGNOSIS — M25562 Pain in left knee: Secondary | ICD-10-CM | POA: Diagnosis not present

## 2022-12-12 DIAGNOSIS — Z23 Encounter for immunization: Secondary | ICD-10-CM | POA: Diagnosis not present

## 2022-12-12 DIAGNOSIS — M069 Rheumatoid arthritis, unspecified: Secondary | ICD-10-CM | POA: Diagnosis not present

## 2022-12-12 DIAGNOSIS — M8580 Other specified disorders of bone density and structure, unspecified site: Secondary | ICD-10-CM | POA: Diagnosis not present

## 2022-12-12 DIAGNOSIS — M545 Low back pain, unspecified: Secondary | ICD-10-CM | POA: Diagnosis not present

## 2022-12-14 DIAGNOSIS — R1314 Dysphagia, pharyngoesophageal phase: Secondary | ICD-10-CM | POA: Diagnosis not present

## 2022-12-14 DIAGNOSIS — K219 Gastro-esophageal reflux disease without esophagitis: Secondary | ICD-10-CM | POA: Diagnosis not present

## 2022-12-15 LAB — MTB-RIF NAA W AFB CULT, NON-SPUTUM: Acid Fast Culture: NEGATIVE

## 2022-12-19 ENCOUNTER — Other Ambulatory Visit: Payer: Self-pay | Admitting: Otolaryngology

## 2022-12-19 DIAGNOSIS — R1314 Dysphagia, pharyngoesophageal phase: Secondary | ICD-10-CM

## 2022-12-19 DIAGNOSIS — K219 Gastro-esophageal reflux disease without esophagitis: Secondary | ICD-10-CM

## 2022-12-21 ENCOUNTER — Ambulatory Visit
Admission: RE | Admit: 2022-12-21 | Discharge: 2022-12-21 | Disposition: A | Discharge status: Home / Self Care | Payer: Medicare Other | Source: Ambulatory Visit | Attending: Otolaryngology | Admitting: Otolaryngology

## 2022-12-21 DIAGNOSIS — J392 Other diseases of pharynx: Secondary | ICD-10-CM | POA: Diagnosis not present

## 2022-12-21 DIAGNOSIS — R1310 Dysphagia, unspecified: Secondary | ICD-10-CM | POA: Diagnosis not present

## 2022-12-21 DIAGNOSIS — K219 Gastro-esophageal reflux disease without esophagitis: Secondary | ICD-10-CM

## 2022-12-21 DIAGNOSIS — R1314 Dysphagia, pharyngoesophageal phase: Secondary | ICD-10-CM

## 2022-12-27 ENCOUNTER — Other Ambulatory Visit: Payer: Self-pay | Admitting: Otolaryngology

## 2022-12-27 DIAGNOSIS — K219 Gastro-esophageal reflux disease without esophagitis: Secondary | ICD-10-CM

## 2022-12-27 DIAGNOSIS — R1314 Dysphagia, pharyngoesophageal phase: Secondary | ICD-10-CM

## 2023-01-04 ENCOUNTER — Ambulatory Visit
Admission: RE | Admit: 2023-01-04 | Discharge: 2023-01-04 | Disposition: A | Discharge status: Home / Self Care | Payer: Medicare Other | Source: Ambulatory Visit | Attending: Otolaryngology | Admitting: Otolaryngology

## 2023-01-04 DIAGNOSIS — K219 Gastro-esophageal reflux disease without esophagitis: Secondary | ICD-10-CM | POA: Diagnosis not present

## 2023-01-04 DIAGNOSIS — R1314 Dysphagia, pharyngoesophageal phase: Secondary | ICD-10-CM | POA: Insufficient documentation

## 2023-01-04 DIAGNOSIS — I6523 Occlusion and stenosis of bilateral carotid arteries: Secondary | ICD-10-CM | POA: Diagnosis not present

## 2023-01-04 DIAGNOSIS — R1310 Dysphagia, unspecified: Secondary | ICD-10-CM | POA: Diagnosis not present

## 2023-01-04 DIAGNOSIS — J432 Centrilobular emphysema: Secondary | ICD-10-CM | POA: Diagnosis not present

## 2023-01-04 LAB — POCT I-STAT CREATININE: Creatinine, Ser: 1.1 mg/dL (ref 0.61–1.24)

## 2023-01-04 MED ORDER — IOHEXOL 300 MG/ML  SOLN
75.0000 mL | Freq: Once | INTRAMUSCULAR | Status: AC | PRN
  Administered 2023-01-04: 75 mL via INTRAVENOUS

## 2023-01-14 DIAGNOSIS — Z79899 Other long term (current) drug therapy: Secondary | ICD-10-CM | POA: Diagnosis not present

## 2023-01-14 DIAGNOSIS — D0461 Carcinoma in situ of skin of right upper limb, including shoulder: Secondary | ICD-10-CM | POA: Diagnosis not present

## 2023-01-14 DIAGNOSIS — I2510 Atherosclerotic heart disease of native coronary artery without angina pectoris: Secondary | ICD-10-CM | POA: Diagnosis not present

## 2023-01-14 DIAGNOSIS — L570 Actinic keratosis: Secondary | ICD-10-CM | POA: Diagnosis not present

## 2023-01-14 DIAGNOSIS — L821 Other seborrheic keratosis: Secondary | ICD-10-CM | POA: Diagnosis not present

## 2023-01-14 DIAGNOSIS — M8580 Other specified disorders of bone density and structure, unspecified site: Secondary | ICD-10-CM | POA: Diagnosis not present

## 2023-01-14 DIAGNOSIS — D0439 Carcinoma in situ of skin of other parts of face: Secondary | ICD-10-CM | POA: Diagnosis not present

## 2023-01-14 DIAGNOSIS — D1801 Hemangioma of skin and subcutaneous tissue: Secondary | ICD-10-CM | POA: Diagnosis not present

## 2023-01-14 DIAGNOSIS — M1990 Unspecified osteoarthritis, unspecified site: Secondary | ICD-10-CM | POA: Diagnosis not present

## 2023-01-14 DIAGNOSIS — C44319 Basal cell carcinoma of skin of other parts of face: Secondary | ICD-10-CM | POA: Diagnosis not present

## 2023-01-14 DIAGNOSIS — Z85828 Personal history of other malignant neoplasm of skin: Secondary | ICD-10-CM | POA: Diagnosis not present

## 2023-01-14 DIAGNOSIS — M545 Low back pain, unspecified: Secondary | ICD-10-CM | POA: Diagnosis not present

## 2023-01-14 DIAGNOSIS — L814 Other melanin hyperpigmentation: Secondary | ICD-10-CM | POA: Diagnosis not present

## 2023-01-14 DIAGNOSIS — M069 Rheumatoid arthritis, unspecified: Secondary | ICD-10-CM | POA: Diagnosis not present

## 2023-01-14 DIAGNOSIS — M25562 Pain in left knee: Secondary | ICD-10-CM | POA: Diagnosis not present

## 2023-02-18 DIAGNOSIS — H401134 Primary open-angle glaucoma, bilateral, indeterminate stage: Secondary | ICD-10-CM | POA: Diagnosis not present

## 2023-02-21 ENCOUNTER — Encounter: Payer: Self-pay | Admitting: Podiatry

## 2023-02-21 ENCOUNTER — Ambulatory Visit: Payer: Medicare Other | Admitting: Podiatry

## 2023-02-21 DIAGNOSIS — E1142 Type 2 diabetes mellitus with diabetic polyneuropathy: Secondary | ICD-10-CM

## 2023-02-21 DIAGNOSIS — B351 Tinea unguium: Secondary | ICD-10-CM

## 2023-02-21 DIAGNOSIS — L84 Corns and callosities: Secondary | ICD-10-CM

## 2023-02-21 DIAGNOSIS — Q828 Other specified congenital malformations of skin: Secondary | ICD-10-CM | POA: Diagnosis not present

## 2023-02-21 DIAGNOSIS — M79676 Pain in unspecified toe(s): Secondary | ICD-10-CM | POA: Diagnosis not present

## 2023-02-21 NOTE — Progress Notes (Signed)
Subjective:  Patient ID: Darryl Jones, male    DOB: [birth year],  MRN: [id number]  67 y.o. male presents with at risk foot care with history of diabetic neuropathy and callus(es) left foot, porokeratotic lesion(s) L 4th toe, and painful mycotic nails. Painful toenails interfere with ambulation. Aggravating factors include wearing enclosed shoe gear. Pain is relieved with periodic professional debridement. Painful callus(es) and porokeratotic lesion(s) are aggravated when weightbearing with and without shoegear. Pain is relieved with periodic professional debridement.   PCP: Georgann Housekeeper, MD.  New problem(s): None.   Review of Systems: Negative except as noted in the HPI.   Allergies  Allergen Reactions   Hydroxyquinolines Anaphylaxis   Loratadine Anaphylaxis and Other (See Comments)   Other Anaphylaxis and Other (See Comments)   Adalimumab Other (See Comments)    Caused an abscess in lung   Hydroxychloroquine Other (See Comments)    Elevated LFTs   Leflunomide Other (See Comments)    Elevated LFT's   Methotrexate Other (See Comments)   Methotrexate Derivatives Other (See Comments)    Elevated lft's   Aspirin Rash and Other (See Comments)   Protonix [Pantoprazole Sodium] Nausea Only    Objective:  There were no vitals filed for this visit. Constitutional Patient is a pleasant 67 y.o. male in NAD. AAO x 3.  Vascular Capillary fill time to digits <3 seconds.  DP/PT pulse(s) are faintly palpable b/l lower extremities. Pedal hair absent b/l. Lower extremity skin temperature gradient warm to warm b/l. No pain with calf compression b/l. No cyanosis or clubbing noted. No ischemia nor gangrene noted b/l. Trace edema noted BLE.  Neurologic Protective sensation intact 5/5 intact bilaterally with 10g monofilament b/l. Vibratory sensation intact b/l. No clonus b/l. Pt has subjective symptoms of neuropathy.  Dermatologic Pedal skin is thin, shiny and atrophic b/l.  No open wounds b/l lower  extremities. No interdigital macerations b/l lower extremities. Toenails 1-5 b/l elongated, discolored, dystrophic, thickened, crumbly with subungual debris and tenderness to dorsal palpation. Hyperkeratotic lesion(s) left great toe and submet head 4 left foot.  No erythema, no edema, no drainage, no fluctuance. Porokeratotic lesion(s) L 4th toe. No erythema, no edema, no drainage, no fluctuance.  Orthopedic: Normal muscle strength 5/5 to all lower extremity muscle groups bilaterally. Pes planus deformity noted bilateral LE.   Last HgA1c:      No data to display           Assessment:   1. Pain due to onychomycosis of toenail   2. Porokeratosis   3. Callus   4. Diabetic polyneuropathy associated with type 2 diabetes mellitus (HCC)    Plan:   -Patient was evaluated today. All questions/concerns addressed on today's visit. -Toenails 1-5 b/l were debrided in length and girth with sterile nail nippers and dremel without iatrogenic bleeding.  -Callus(es) L hallux and submet head 4 left foot pared utilizing sterile scalpel blade without complication or incident. Total number debrided =2. -Porokeratotic lesion(s) L 4th toe and submet head 4 left foot pared and enucleated with sterile currette without incident. Total number of lesions debrided=1. -Patient/POA to call should there be question/concern in the interim.  Return in about 3 months (around 05/22/2023).  Freddie Breech, DPM      [facility] LOCATION: [address]  [place], [place] [address]                   Office [phone]   [facility] LOCATION: [address] [place], [place] [address] Office [phone]

## 2023-02-26 DIAGNOSIS — H2513 Age-related nuclear cataract, bilateral: Secondary | ICD-10-CM | POA: Diagnosis not present

## 2023-02-26 DIAGNOSIS — H401134 Primary open-angle glaucoma, bilateral, indeterminate stage: Secondary | ICD-10-CM | POA: Diagnosis not present

## 2023-03-13 DIAGNOSIS — R918 Other nonspecific abnormal finding of lung field: Secondary | ICD-10-CM

## 2023-03-13 HISTORY — DX: Other nonspecific abnormal finding of lung field: R91.8

## 2023-03-16 ENCOUNTER — Other Ambulatory Visit: Payer: Self-pay

## 2023-03-16 DIAGNOSIS — I10 Essential (primary) hypertension: Secondary | ICD-10-CM | POA: Diagnosis not present

## 2023-03-16 DIAGNOSIS — I2510 Atherosclerotic heart disease of native coronary artery without angina pectoris: Secondary | ICD-10-CM | POA: Insufficient documentation

## 2023-03-16 DIAGNOSIS — Z7901 Long term (current) use of anticoagulants: Secondary | ICD-10-CM | POA: Insufficient documentation

## 2023-03-16 DIAGNOSIS — E119 Type 2 diabetes mellitus without complications: Secondary | ICD-10-CM | POA: Diagnosis not present

## 2023-03-16 DIAGNOSIS — S00411A Abrasion of right ear, initial encounter: Secondary | ICD-10-CM | POA: Insufficient documentation

## 2023-03-16 DIAGNOSIS — Z950 Presence of cardiac pacemaker: Secondary | ICD-10-CM | POA: Diagnosis not present

## 2023-03-16 DIAGNOSIS — Z85828 Personal history of other malignant neoplasm of skin: Secondary | ICD-10-CM | POA: Diagnosis not present

## 2023-03-16 DIAGNOSIS — J449 Chronic obstructive pulmonary disease, unspecified: Secondary | ICD-10-CM | POA: Diagnosis not present

## 2023-03-16 DIAGNOSIS — X58XXXA Exposure to other specified factors, initial encounter: Secondary | ICD-10-CM | POA: Insufficient documentation

## 2023-03-16 DIAGNOSIS — H9221 Otorrhagia, right ear: Secondary | ICD-10-CM | POA: Diagnosis not present

## 2023-03-16 DIAGNOSIS — S0991XA Unspecified injury of ear, initial encounter: Secondary | ICD-10-CM | POA: Diagnosis present

## 2023-03-16 NOTE — ED Triage Notes (Signed)
 Pt reports he felt something swimming in his ear approx 1 hour ago. Went to the bathroom and used a cut tip to clean out his ear. Reports noticed bright red blood at this time. No pain. No injury. No active bleeding during triage assessment.

## 2023-03-17 ENCOUNTER — Emergency Department
Admission: EM | Admit: 2023-03-17 | Discharge: 2023-03-17 | Disposition: A | Discharge status: Home / Self Care | Payer: Medicare Other | Attending: Emergency Medicine | Admitting: Emergency Medicine

## 2023-03-17 DIAGNOSIS — H9221 Otorrhagia, right ear: Secondary | ICD-10-CM | POA: Diagnosis not present

## 2023-03-17 NOTE — Discharge Instructions (Signed)
 You have a small abrasion to the inner aspect of the right external auditory canal.  There is no eardrum perforation and no sign of ear infection.  Please do not place anything into your ear for the next 3 days.  Please do not put your head underwater but it is okay to take a shower.  You may still notice a small amount of blood draining from your ear over the next day or 2.  Please follow-up with your PCP if you have any other concerns.

## 2023-03-17 NOTE — ED Provider Notes (Signed)
 [facility] Provider Note    Event Date/Time   First MD Initiated Contact with Patient 03/17/23 0034     (approximate)   History   Right Ear Bleeding   HPI  Darryl Jones is a 68 y.o. male with history of coronary artery disease, COPD, hypertension, hyperlipidemia, A-fib, rheumatoid arthritis, diabetes, third-degree AV block status post pacemaker who presents to the emergency department with bleeding coming from his right ear.  Patient states he has had some sinus congestion and pressure and felt a swishing in his right ear tonight.  He put a Q-tip in his ear and when he pulled it out there was blood.  He denies any pain, hearing loss.  No fevers.  Wife reports he is already on antibiotics.  No cough, chest pain or shortness of breath.   History provided by patient, wife.    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)    a.) s/p surgery 10/02/2013 --> bronchoscopy + RIGHT VATS with diagnostic RUL/RML/RLL wedge resections + RIGHT pleural biopsy, and partial decortication --> pathology consistent with rheumatoid lung disease   Elevated liver enzymes    a.) secondary to use of MTX for RA diagnosis   Glaucoma    Hearing loss    Hepatic steatosis    History of hiatal hernia    Hyperlipidemia    Hypertension    Laryngopharyngeal reflux    Mass of upper lobe of left lung    On apixaban therapy    Osteoporosis    Paroxysmal atrial fibrillation (HCC)    a.) CHA2DS2VASc = 4 (age, HTN, vascular  disease history, T2DM);  b.) rate/rhythm maintained intrisically without phamacological intervention; chronically anticoagulated with apixaban   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) 10/28/2017   a.) MDT Azure XT DR MRI SureScan dual chamber PPM (SN: [id number])   Rheumatoid arthritis (HCC)    Rosacea, acne    Sepsis (HCC)    Shortness of breath    Third degree AV block (HCC)    a.) s/p MDT dual chamber PPM placement 10/28/2017   Type 2 diabetes mellitus with peripheral angiopathy (HCC)     Past Surgical History:  Procedure Laterality Date   ABDOMINAL EXPLORATION SURGERY  1966   undescended testicle; explore for cancer   GLAUCOMA SURGERY Right 01/2022   laser (did not take)   HIATAL HERNIA REPAIR  2000   KNEE SURGERY Left 1995   knee cap shattered; wire in place   LUNG SURGERY Right    abscess lung biopsy (right lower)   PACEMAKER INSERTION Left 10/28/2017   Procedure: INSERTION PACEMAKER-INITIAL INSERT DUAL CHAMBER;  Surgeon: Ammon Blunt, MD;  Location: [facility] ORS;  Service: Cardiovascular;  Laterality: Left;   SUBMANDIBULAR GLAND EXCISION Right 01/07/2013   Dr Jesus HOPPER GLAND EXCISION Right 01/07/2013   Procedure: REMOVAL OF RIGHT SUBMANDIBULAR GLAND;  Surgeon: Ida Jesus, MD;  Location: [facility] OR;  Service: ENT;  Laterality: Right;   TONSILLECTOMY     child   VIDEO BRONCHOSCOPY WITH ENDOBRONCHIAL ULTRASOUND N/A 10/29/2022   Procedure: VIDEO BRONCHOSCOPY WITH ENDOBRONCHIAL ULTRASOUND;  Surgeon: Parris Manna, MD;  Location: [facility] ORS;  Service: Thoracic;  Laterality: N/A;    MEDICATIONS:  Prior to Admission medications   Medication Sig Start Date End Date Taking? Authorizing Provider  acetaminophen  (TYLENOL ) 650 MG CR tablet Take 1,300 mg by mouth 2 (two) times daily.    [provider]  Albuterol  Sulfate (PROAIR  RESPICLICK) 108 (90 Base) MCG/ACT AEPB Inhale 2 puffs into the lungs every 6 (six) hours as needed (respiratory  difficulties).    [provider]  atorvastatin  (LIPITOR) 20 MG tablet Take 20 mg by mouth daily.    [provider]  brimonidine (ALPHAGAN) 0.2 % ophthalmic solution Place 1 drop into both eyes 2 (two) times daily.    [provider]  Cholecalciferol  (VITAMIN D3) 10 MCG (400 UNIT) CAPS Take 1 capsule by mouth daily.    [provider]  ELIQUIS 5 MG TABS tablet Take 5 mg by mouth 2 (two) times daily. 05/21/21   [provider]  Fluticasone-Umeclidin-Vilant (TRELEGY ELLIPTA) 100-62.5-25 MCG/ACT AEPB Inhale 1 puff into the lungs daily.    [provider]  glimepiride  (AMARYL ) 1 MG tablet Take 1 mg by mouth daily with breakfast.    [provider]  ipratropium-albuterol  (DUONEB) 0.5-2.5 (3) MG/3ML SOLN Take 3 mLs by nebulization every 6 (six) hours as needed.    [provider]  latanoprost  (XALATAN ) 0.005 % ophthalmic solution Place 1 drop into both eyes at bedtime.  03/26/17   [provider]    Physical Exam   Triage Vital Signs: ED Triage Vitals  Encounter Vitals Group     BP 03/16/23 2347 (!) 178/83     Systolic BP Percentile --      Diastolic BP Percentile --      Pulse Rate 03/16/23 2347 64     Resp 03/16/23 2347 16     Temp 03/16/23 2347 98.6 F (37 C)     Temp Source 03/16/23 2347 Oral     SpO2 03/16/23 2347 99 %     Weight 03/16/23 2349 192 lb (87.1 kg)     Height 03/16/23 2349 5' 6 (1.676 m)     Head Circumference --      Peak Flow --      Pain Score 03/16/23 2350 0     Pain Loc --      Pain Education --      Exclude from Growth Chart --     Most recent vital signs: Vitals:   03/16/23 2347  BP: (!) 178/83  Pulse: 64  Resp: 16  Temp: 98.6 F (37 C)  SpO2: 99%    CONSTITUTIONAL: Alert, responds appropriately to questions. Well-appearing; well-nourished HEAD: Normocephalic, atraumatic EYES: Conjunctivae clear, pupils appear equal, sclera nonicteric ENT: normal nose; moist mucous  membranes; No pharyngeal erythema or petechiae, no tonsillar hypertrophy or exudate, no uvular deviation, no unilateral swelling in posterior oropharynx, no trismus or drooling, no muffled voice, normal phonation, no stridor, airway patent.  Right TM is clear without erythema, purulence, bulging, perforation.  He does appear to have a small effusion behind the right TM.  No cerumen impaction or sign of foreign body in the external auditory canal. No inflammation, erythema or drainage from the external auditory canal. No signs of mastoiditis. No pain with manipulation of the pinna bilaterally.  Patient  has a superficial abrasion to the right external auditory canal about midway down the canal with a small amount of blood.  Does not appear to be actively bleeding. NECK: Supple, normal ROM CARD: RRR; S1 and S2 appreciated RESP: Normal chest excursion without splinting or tachypnea; breath sounds clear and equal bilaterally; no wheezes, no rhonchi, no rales, no hypoxia or respiratory distress, speaking full sentences ABD/GI: Non-distended; soft, non-tender, no rebound, no guarding, no peritoneal signs BACK: The back appears normal EXT: Normal ROM in all joints; no deformity noted, no edema SKIN: Normal color for age and race; warm; no rash on exposed skin NEURO: Moves all extremities equally, normal speech PSYCH: The patient's mood and manner are appropriate.   ED Results / Procedures / Treatments   LABS: (all labs ordered are listed, but only abnormal results are displayed) Labs Reviewed - No data to display   EKG:  RADIOLOGY: My personal review and interpretation of imaging:    I have personally reviewed all radiology reports.   No results found.   PROCEDURES:  Critical Care performed: No     Procedures    IMPRESSION / MDM / ASSESSMENT AND PLAN / ED COURSE  I reviewed the triage vital signs and the nursing notes.    Patient here with an abrasion to the right external  auditory canal.    DIFFERENTIAL DIAGNOSIS (includes but not limited to):   Abrasion to the external auditory canal, no sign of TM perforation or otitis media.  No site of otitis externa.  Does have a small effusion likely related to sinusitis.   Patient's presentation is most consistent with acute complicated illness / injury requiring diagnostic workup.   PLAN: Patient has sinusitis, fluid behind the right ear likely the cause of his swishing that he felt earlier.  He is already on antibiotics.  Recommended antihistamines, Flonase but patient and wife very apprehensive given his chronic medical history.  As for the bleeding from his ear, this appears to be coming from a small abrasion likely from the Q-tip.  No sign of any active bleeding, otitis externa or perforation.  Recommended that he does not put anything into his ear for the next several days and does not submerge his head underwater.  I feel he is safe for discharge and follow-up with his PCP as needed.  MEDICATIONS GIVEN IN ED: Medications - No data to display   ED COURSE:  At this time, I do not feel there is any life-threatening condition present. I reviewed all nursing notes, vitals, pertinent previous records.  All lab and urine results, EKGs, imaging ordered have been independently reviewed and interpreted by myself.  I reviewed all available radiology reports from any imaging ordered this visit.  Based on my assessment, I feel the patient is safe to be discharged home without further emergent workup and can continue workup as an outpatient as needed. Discussed all findings, treatment plan as well as usual and customary return precautions.  They verbalize understanding and are comfortable with this plan.  Outpatient follow-up has been provided as needed.  All questions have been answered.    CONSULTS:  none   OUTSIDE RECORDS REVIEWED: Reviewed last cardiology notes.  Reviewed ENT note in October 2024.       FINAL  CLINICAL IMPRESSION(S) / ED DIAGNOSES   Final diagnoses:  Bleeding from right ear     Rx / DC Orders   ED Discharge Orders     None  Note:  This document was prepared using Dragon voice recognition software and may include unintentional dictation errors.   Amaurie Wandel, Josette SAILOR, DO 03/17/23 [phone]

## 2023-03-28 ENCOUNTER — Other Ambulatory Visit (INDEPENDENT_AMBULATORY_CARE_PROVIDER_SITE_OTHER): Payer: Self-pay | Admitting: Nurse Practitioner

## 2023-03-28 DIAGNOSIS — I6523 Occlusion and stenosis of bilateral carotid arteries: Secondary | ICD-10-CM

## 2023-04-03 ENCOUNTER — Encounter (INDEPENDENT_AMBULATORY_CARE_PROVIDER_SITE_OTHER): Payer: Self-pay | Admitting: Nurse Practitioner

## 2023-04-03 ENCOUNTER — Ambulatory Visit (INDEPENDENT_AMBULATORY_CARE_PROVIDER_SITE_OTHER): Payer: Medicare Other

## 2023-04-03 ENCOUNTER — Ambulatory Visit (INDEPENDENT_AMBULATORY_CARE_PROVIDER_SITE_OTHER): Payer: Medicare Other | Admitting: Nurse Practitioner

## 2023-04-03 VITALS — BP 134/81 | HR 60 | Resp 16 | Ht 66.0 in | Wt 205.4 lb

## 2023-04-03 DIAGNOSIS — E11621 Type 2 diabetes mellitus with foot ulcer: Secondary | ICD-10-CM

## 2023-04-03 DIAGNOSIS — I6523 Occlusion and stenosis of bilateral carotid arteries: Secondary | ICD-10-CM | POA: Diagnosis not present

## 2023-04-03 DIAGNOSIS — E785 Hyperlipidemia, unspecified: Secondary | ICD-10-CM | POA: Diagnosis not present

## 2023-04-03 NOTE — Progress Notes (Signed)
Subjective:    Patient ID: Darryl Jones, male    DOB: Jan 04, 1956, 68 y.o.   MRN: [id number] Chief Complaint  Patient presents with   New Patient (Initial Visit)    Ref Paraschos consult carotid stenosis from CT    The patient is seen for evaluation of carotid stenosis. The carotid stenosis was identified after recent CT of the soft tissue of the neck, suggestive that there may be some significant carotid stenosis.  The patient denies amaurosis fugax. There is no recent history of TIA symptoms or focal motor deficits. There is no prior documented CVA.  There is no history of migraine headaches. There is no history of seizures.  The patient is on Eliquis and statin  No recent shortening of the patient's walking distance or new symptoms consistent with claudication.  No history of rest pain symptoms. No new ulcers or wounds of the lower extremities have occurred.  There is no history of DVT, PE or superficial thrombophlebitis. No recent episodes of angina or shortness of breath documented.   Today noninvasive study showed no significant carotid disease.  He has a 1 to 39% stenosis of the bilateral internal carotid arteries.  Bilateral vertebral arteries have antegrade flow with normal flow hemodynamics in the bilateral subclavian arteries     Review of Systems  All other systems reviewed and are negative.      Objective:   Physical Exam Vitals reviewed.  HENT:     Head: Normocephalic.  Neck:     Vascular: No carotid bruit.  Cardiovascular:     Rate and Rhythm: Normal rate.  Pulmonary:     Effort: Pulmonary effort is normal.  Skin:    General: Skin is warm and dry.  Neurological:     Mental Status: He is alert and oriented to person, place, and time.  Psychiatric:        Mood and Affect: Mood normal.        Behavior: Behavior normal.        Thought Content: Thought content normal.        Judgment: Judgment normal.     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)    a.) s/p surgery 10/02/2013 --> bronchoscopy + RIGHT VATS with diagnostic RUL/RML/RLL wedge resections + RIGHT pleural biopsy, and partial decortication --> pathology consistent with rheumatoid lung disease   Elevated liver enzymes    a.) secondary to use of MTX for RA diagnosis   Glaucoma    Hearing loss    Hepatic steatosis    History of hiatal hernia    Hyperlipidemia    Hypertension    Laryngopharyngeal reflux    Mass of upper lobe of left lung    On apixaban therapy    Osteoporosis  Paroxysmal atrial fibrillation (HCC)    a.) CHA2DS2VASc = 4 (age, HTN, vascular disease history, T2DM);  b.) rate/rhythm maintained intrisically without phamacological intervention; chronically anticoagulated with apixaban   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) 10/28/2017   a.) MDT Azure XT DR MRI SureScan dual chamber PPM (SN: [id number])   Rheumatoid arthritis (HCC)    Rosacea, acne    Sepsis (HCC)    Shortness of breath    Third degree AV block (HCC)    a.) s/p MDT dual chamber PPM placement 10/28/2017   Type 2 diabetes mellitus with peripheral angiopathy (HCC)     Social History   Socioeconomic History   Marital status: Married    Spouse name: Stanton Kidney   Number of children: 0   Years of education: Not on file   Highest education level: Not on file  Occupational History   Not on file  Tobacco Use    Smoking status: Former    Current packs/day: 0.00    Types: Cigarettes    Quit date: 06/08/2010    Years since quitting: 12.8   Smokeless tobacco: Never   Tobacco comments:    "quit smoking cigarettes in 2012"  Vaping Use   Vaping status: Never Used  Substance and Sexual Activity   Alcohol use: No   Drug use: No   Sexual activity: Not on file  Other Topics Concern   Not on file  Social History Narrative   Not on file   Social Drivers of Health   Financial Resource Strain: Not on file  Food Insecurity: Low Risk  (12/14/2022)   Received from [facility]   Hunger Vital Sign    Worried About Running Out of Food in the Last Year: Never true    Ran Out of Food in the Last Year: Never true  Transportation Needs: No Transportation Needs (12/14/2022)   Received from [facility]    In the past 12 months, has lack of reliable transportation kept you from medical appointments, meetings, work or from getting things needed for daily living? : No  Physical Activity: Not on file  Stress: Not on file  Social Connections: Not on file  Intimate Partner Violence: Not on file    Past Surgical History:  Procedure Laterality Date   ABDOMINAL EXPLORATION SURGERY  1966   undescended testicle; explore for cancer   GLAUCOMA SURGERY Right 01/2022   laser (did not take)   HIATAL HERNIA REPAIR  2000   KNEE SURGERY Left 1995   knee cap shattered; wire in place   LUNG SURGERY Right    abscess lung biopsy (right lower)   PACEMAKER INSERTION Left 10/28/2017   Procedure: INSERTION PACEMAKER-INITIAL INSERT DUAL CHAMBER;  Surgeon: Marcina Millard, MD;  Location: [facility] ORS;  Service: Cardiovascular;  Laterality: Left;   SUBMANDIBULAR GLAND EXCISION Right 01/07/2013   Dr Mahlon Gammon GLAND EXCISION Right 01/07/2013   Procedure: REMOVAL OF RIGHT SUBMANDIBULAR GLAND;  Surgeon: Serena Colonel, MD;  Location: [facility] OR;  Service: ENT;  Laterality: Right;   TONSILLECTOMY      child   VIDEO BRONCHOSCOPY WITH ENDOBRONCHIAL ULTRASOUND N/A 10/29/2022   Procedure: VIDEO BRONCHOSCOPY WITH ENDOBRONCHIAL ULTRASOUND;  Surgeon: Vida Rigger, MD;  Location: [facility] OR[facility];  Service: Thoracic;  Laterality: N/A;    Family History  Problem Relation Age of Onset   Diabetes Sister    Heart disease Sister    Kidney disease Sister    Diabetes Brother  Allergies  Allergen Reactions   Hydroxyquinolines Anaphylaxis   Loratadine Anaphylaxis and Other (See Comments)   Other Anaphylaxis and Other (See Comments)   Pantoprazole Sodium Nausea Only and Anaphylaxis    nausea   Adalimumab Other (See Comments)    Caused an abscess in lung   Hydroxychloroquine Other (See Comments)    Elevated LFTs   Leflunomide Other (See Comments)    Elevated LFT's   Methotrexate Other (See Comments)   Methotrexate Derivatives Other (See Comments)    Elevated lft's   Aspirin Rash and Other (See Comments)       Latest Ref Rng & Units 10/23/2022    2:39 PM 04/25/2022    8:55 AM 04/21/2022    1:16 AM  CBC  WBC 4.0 - 10.5 K/uL 8.3  10.2  12.9   Hemoglobin 13.0 - 17.0 g/dL 41.3  24.4  01.0   Hematocrit 39.0 - 52.0 % 47.8  42.8  48.3   Platelets 150 - 400 K/uL 276  220  207       CMP     Component Value Date/Time   NA 138 10/23/2022 1439   K 4.4 10/23/2022 1439   CL 102 10/23/2022 1439   CO2 27 10/23/2022 1439   GLUCOSE 140 (H) 10/23/2022 1439   BUN 15 10/23/2022 1439   CREATININE 1.10 01/04/2023 1511   CALCIUM 9.0 10/23/2022 1439   PROT 6.8 04/25/2022 0855   ALBUMIN 2.8 (L) 04/25/2022 0855   AST 31 04/25/2022 0855   ALT 31 04/25/2022 0855   ALKPHOS 20 (L) 04/25/2022 0855   BILITOT 0.8 04/25/2022 0855   GFRNONAA >60 10/23/2022 1439     No results found.     Assessment & Plan:   1. Bilateral carotid artery stenosis (Primary) Recommend:  Given the patient's asymptomatic subcritical stenosis no further invasive testing or surgery at this time.  Duplex ultrasound shows  <40% stenosis bilaterally.  Continue antiplatelet therapy as prescribed Continue management of CAD, HTN and Hyperlipidemia Healthy heart diet,  encouraged exercise at least 4 times per week  Follow up in 12 months with duplex ultrasound and physical exam   2. Hyperlipidemia, unspecified hyperlipidemia type Continue statin as ordered and reviewed, no changes at this time  3. Type 2 diabetes mellitus with foot ulcer (CODE) (HCC) Continue hypoglycemic medications as already ordered, these medications have been reviewed and there are no changes at this time.  Hgb A1C to be monitored as already arranged by primary service   Current Outpatient Medications on File Prior to Visit  Medication Sig Dispense Refill   acetaminophen (TYLENOL) 650 MG CR tablet Take 1,300 mg by mouth 2 (two) times daily.     Albuterol Sulfate (PROAIR RESPICLICK) 108 (90 Base) MCG/ACT AEPB Inhale 2 puffs into the lungs every 6 (six) hours as needed (respiratory difficulties).     atorvastatin (LIPITOR) 20 MG tablet Take 20 mg by mouth daily.     brimonidine (ALPHAGAN) 0.2 % ophthalmic solution Place 1 drop into both eyes 2 (two) times daily.     Cholecalciferol (VITAMIN D3) 10 MCG (400 UNIT) CAPS Take 1 capsule by mouth daily.     ELIQUIS 5 MG TABS tablet Take 5 mg by mouth 2 (two) times daily.     Fluticasone-Umeclidin-Vilant (TRELEGY ELLIPTA) 100-62.5-25 MCG/ACT AEPB Inhale 1 puff into the lungs daily.     glimepiride (AMARYL) 1 MG tablet Take 1 mg by mouth daily with breakfast.     ipratropium-albuterol (DUONEB) 0.5-2.5 (3) MG/3ML  SOLN Take 3 mLs by nebulization every 6 (six) hours as needed.     latanoprost (XALATAN) 0.005 % ophthalmic solution Place 1 drop into both eyes at bedtime.      No current facility-administered medications on file prior to visit.    There are no Patient Instructions on file for this visit. No follow-ups on file.   Georgiana Spinner, NP

## 2023-04-05 DIAGNOSIS — H2513 Age-related nuclear cataract, bilateral: Secondary | ICD-10-CM | POA: Diagnosis not present

## 2023-04-05 DIAGNOSIS — J42 Unspecified chronic bronchitis: Secondary | ICD-10-CM | POA: Diagnosis not present

## 2023-04-05 DIAGNOSIS — I480 Paroxysmal atrial fibrillation: Secondary | ICD-10-CM | POA: Diagnosis not present

## 2023-04-05 DIAGNOSIS — Z950 Presence of cardiac pacemaker: Secondary | ICD-10-CM | POA: Diagnosis not present

## 2023-04-05 DIAGNOSIS — E785 Hyperlipidemia, unspecified: Secondary | ICD-10-CM | POA: Diagnosis not present

## 2023-04-05 DIAGNOSIS — E119 Type 2 diabetes mellitus without complications: Secondary | ICD-10-CM | POA: Diagnosis not present

## 2023-04-05 DIAGNOSIS — R0609 Other forms of dyspnea: Secondary | ICD-10-CM | POA: Diagnosis not present

## 2023-04-05 DIAGNOSIS — I442 Atrioventricular block, complete: Secondary | ICD-10-CM | POA: Diagnosis not present

## 2023-04-05 DIAGNOSIS — H401134 Primary open-angle glaucoma, bilateral, indeterminate stage: Secondary | ICD-10-CM | POA: Diagnosis not present

## 2023-04-17 DIAGNOSIS — Z950 Presence of cardiac pacemaker: Secondary | ICD-10-CM | POA: Diagnosis not present

## 2023-04-17 DIAGNOSIS — I442 Atrioventricular block, complete: Secondary | ICD-10-CM | POA: Diagnosis not present

## 2023-05-06 DIAGNOSIS — N182 Chronic kidney disease, stage 2 (mild): Secondary | ICD-10-CM | POA: Diagnosis not present

## 2023-05-06 DIAGNOSIS — I700 Atherosclerosis of aorta: Secondary | ICD-10-CM | POA: Diagnosis not present

## 2023-05-06 DIAGNOSIS — E782 Mixed hyperlipidemia: Secondary | ICD-10-CM | POA: Diagnosis not present

## 2023-05-06 DIAGNOSIS — M069 Rheumatoid arthritis, unspecified: Secondary | ICD-10-CM | POA: Diagnosis not present

## 2023-05-06 DIAGNOSIS — J449 Chronic obstructive pulmonary disease, unspecified: Secondary | ICD-10-CM | POA: Diagnosis not present

## 2023-05-06 DIAGNOSIS — K635 Polyp of colon: Secondary | ICD-10-CM | POA: Diagnosis not present

## 2023-05-06 DIAGNOSIS — I480 Paroxysmal atrial fibrillation: Secondary | ICD-10-CM | POA: Diagnosis not present

## 2023-05-06 DIAGNOSIS — G4733 Obstructive sleep apnea (adult) (pediatric): Secondary | ICD-10-CM | POA: Diagnosis not present

## 2023-05-06 DIAGNOSIS — E1142 Type 2 diabetes mellitus with diabetic polyneuropathy: Secondary | ICD-10-CM | POA: Diagnosis not present

## 2023-05-06 DIAGNOSIS — E1151 Type 2 diabetes mellitus with diabetic peripheral angiopathy without gangrene: Secondary | ICD-10-CM | POA: Diagnosis not present

## 2023-05-06 DIAGNOSIS — H409 Unspecified glaucoma: Secondary | ICD-10-CM | POA: Diagnosis not present

## 2023-05-13 DIAGNOSIS — Z87891 Personal history of nicotine dependence: Secondary | ICD-10-CM | POA: Diagnosis not present

## 2023-05-13 DIAGNOSIS — J449 Chronic obstructive pulmonary disease, unspecified: Secondary | ICD-10-CM | POA: Diagnosis not present

## 2023-05-13 DIAGNOSIS — R918 Other nonspecific abnormal finding of lung field: Secondary | ICD-10-CM | POA: Diagnosis not present

## 2023-05-15 DIAGNOSIS — M069 Rheumatoid arthritis, unspecified: Secondary | ICD-10-CM | POA: Diagnosis not present

## 2023-05-15 DIAGNOSIS — I2510 Atherosclerotic heart disease of native coronary artery without angina pectoris: Secondary | ICD-10-CM | POA: Diagnosis not present

## 2023-05-15 DIAGNOSIS — M1990 Unspecified osteoarthritis, unspecified site: Secondary | ICD-10-CM | POA: Diagnosis not present

## 2023-05-15 DIAGNOSIS — M8580 Other specified disorders of bone density and structure, unspecified site: Secondary | ICD-10-CM | POA: Diagnosis not present

## 2023-05-15 DIAGNOSIS — I4891 Unspecified atrial fibrillation: Secondary | ICD-10-CM | POA: Diagnosis not present

## 2023-05-15 DIAGNOSIS — Z7901 Long term (current) use of anticoagulants: Secondary | ICD-10-CM | POA: Diagnosis not present

## 2023-05-15 DIAGNOSIS — Z79899 Other long term (current) drug therapy: Secondary | ICD-10-CM | POA: Diagnosis not present

## 2023-05-15 DIAGNOSIS — M545 Low back pain, unspecified: Secondary | ICD-10-CM | POA: Diagnosis not present

## 2023-05-20 ENCOUNTER — Other Ambulatory Visit: Payer: Self-pay | Admitting: Pulmonary Disease

## 2023-05-20 DIAGNOSIS — J449 Chronic obstructive pulmonary disease, unspecified: Secondary | ICD-10-CM

## 2023-05-20 DIAGNOSIS — R918 Other nonspecific abnormal finding of lung field: Secondary | ICD-10-CM

## 2023-05-23 ENCOUNTER — Ambulatory Visit (INDEPENDENT_AMBULATORY_CARE_PROVIDER_SITE_OTHER): Payer: Medicare Other | Admitting: Podiatry

## 2023-05-23 ENCOUNTER — Encounter: Payer: Self-pay | Admitting: Podiatry

## 2023-05-23 VITALS — Ht 66.0 in | Wt 205.4 lb

## 2023-05-23 DIAGNOSIS — M79676 Pain in unspecified toe(s): Secondary | ICD-10-CM

## 2023-05-23 DIAGNOSIS — L84 Corns and callosities: Secondary | ICD-10-CM

## 2023-05-23 DIAGNOSIS — E1142 Type 2 diabetes mellitus with diabetic polyneuropathy: Secondary | ICD-10-CM

## 2023-05-23 DIAGNOSIS — B351 Tinea unguium: Secondary | ICD-10-CM | POA: Diagnosis not present

## 2023-05-23 DIAGNOSIS — Q828 Other specified congenital malformations of skin: Secondary | ICD-10-CM

## 2023-05-23 NOTE — Progress Notes (Signed)
 Subjective:  Patient ID: Darryl Jones, male    DOB: [birth year],  MRN: [id number]  Darryl Jones presents to clinic today for at risk foot care with history of diabetic neuropathy and callus(es) left foot, porokeratotic lesion(s) left foot, and painful mycotic nails. Painful toenails interfere with ambulation. Aggravating factors include wearing enclosed shoe gear. Pain is relieved with periodic professional debridement. Painful callus(es) and porokeratotic lesion(s) are aggravated when weightbearing with and without shoegear. Pain is relieved with periodic professional debridement.  Chief Complaint  Patient presents with   Nail Problem    Pt is here for [facility], unsure of last A1C PCP is Dr Donette Larry and LOV was in August.   New problem(s): None.   PCP is Georgann Housekeeper, MD.  Allergies  Allergen Reactions   Hydroxyquinolines Anaphylaxis   Loratadine Anaphylaxis and Other (See Comments)   Other Anaphylaxis and Other (See Comments)   Pantoprazole Sodium Nausea Only and Anaphylaxis    nausea   Adalimumab Other (See Comments)    Caused an abscess in lung   Hydroxychloroquine Other (See Comments)    Elevated LFTs   Leflunomide Other (See Comments)    Elevated LFT's   Methotrexate Other (See Comments)   Methotrexate Derivatives Other (See Comments)    Elevated lft's   Aspirin Rash and Other (See Comments)    Review of Systems: Negative except as noted in the HPI.  Objective: No changes noted in today's physical examination. There were no vitals filed for this visit. Darryl Jones is a pleasant 68 y.o. male in NAD. AAO x 3.  Vascular Examination: CFT <3 seconds b/l. DP/PT pulses faintly palpable b/l. Skin temperature gradient warm to warm b/l. No pain with calf compression. No ischemia or gangrene. No cyanosis or clubbing noted b/l. Pedal hair absent. No edema noted b/l LE.   Neurological Examination: Sensation grossly intact b/l with 10 gram monofilament. Vibratory sensation  intact b/l. Pt has subjective symptoms of neuropathy.  Dermatological Examination: Pedal skin warm and supple b/l.   No open wounds. No interdigital macerations.  Toenails 1-5 b/l thick, discolored, elongated with subungual debris and pain on dorsal palpation.    Hyperkeratotic lesion(s) left great toe.  No erythema, no edema, no drainage, no fluctuance. Porokeratotic lesion(s) L 4th toe and submet head 4 left foot. No erythema, no edema, no drainage, no fluctuance.  Musculoskeletal Examination: Normal muscle strength 5/5 to all lower extremity muscle groups bilaterally. Pes planus deformity noted bilateral LE.Marland Kitchen No pain, crepitus or joint limitation noted with ROM b/l LE.  Patient ambulates independently without assistive aids.  Radiographs: None  Assessment/Plan: 1. Pain due to onychomycosis of toenail   2. Porokeratosis   3. Callus   4. Diabetic polyneuropathy associated with type 2 diabetes mellitus (HCC)    -Consent given for treatment as described below: -Examined patient. -Continue foot and shoe inspections daily. Monitor blood glucose per PCP/Endocrinologist's recommendations. -Patient to continue soft, supportive shoe gear daily. -Toenails 1-5 bilaterally were debrided in length and girth with sterile nail nippers and dremel. Pinpoint bleeding of R hallux addressed with Lumicain Hemostatic Solution, cleansed with alcohol. Triple antibiotic ointment applied. No further treatment required by patient/caregiver. -Callus(es) left great toe pared utilizing sterile scalpel blade without complication or incident. Total number debrided =1. -Porokeratotic lesion(s) L 4th toe and submet head 4 left foot pared and enucleated with sterile currette without incident. Total number of lesions debrided=2. -Patient/POA to call should there be question/concern in the interim.   Return in  about 3 months (around 08/23/2023).  Freddie Breech, DPM       LOCATION: [address], [place] [address]                   Office [phone]   [facility] LOCATION: [address] [place], [place] [address] Office [phone]

## 2023-05-29 ENCOUNTER — Encounter
Admission: RE | Admit: 2023-05-29 | Discharge: 2023-05-29 | Disposition: A | Discharge status: Home / Self Care | Source: Ambulatory Visit | Attending: Pulmonary Disease | Admitting: Pulmonary Disease

## 2023-05-29 ENCOUNTER — Other Ambulatory Visit: Payer: Self-pay

## 2023-05-29 DIAGNOSIS — I10 Essential (primary) hypertension: Secondary | ICD-10-CM | POA: Insufficient documentation

## 2023-05-29 DIAGNOSIS — I442 Atrioventricular block, complete: Secondary | ICD-10-CM

## 2023-05-29 DIAGNOSIS — I482 Chronic atrial fibrillation, unspecified: Secondary | ICD-10-CM | POA: Diagnosis not present

## 2023-05-29 DIAGNOSIS — I4891 Unspecified atrial fibrillation: Secondary | ICD-10-CM

## 2023-05-29 DIAGNOSIS — Z01818 Encounter for other preprocedural examination: Secondary | ICD-10-CM | POA: Diagnosis not present

## 2023-05-29 DIAGNOSIS — Z01810 Encounter for preprocedural cardiovascular examination: Secondary | ICD-10-CM | POA: Diagnosis not present

## 2023-05-29 DIAGNOSIS — Z01812 Encounter for preprocedural laboratory examination: Secondary | ICD-10-CM

## 2023-05-29 DIAGNOSIS — E11621 Type 2 diabetes mellitus with foot ulcer: Secondary | ICD-10-CM

## 2023-05-29 HISTORY — DX: Sleep apnea, unspecified: G47.30

## 2023-05-29 HISTORY — DX: Occlusion and stenosis of bilateral carotid arteries: I65.23

## 2023-05-29 HISTORY — DX: Gastro-esophageal reflux disease without esophagitis: K21.9

## 2023-05-29 LAB — CBC
HCT: 45.6 % (ref 39.0–52.0)
Hemoglobin: 15.7 g/dL (ref 13.0–17.0)
MCH: 30.8 pg (ref 26.0–34.0)
MCHC: 34.4 g/dL (ref 30.0–36.0)
MCV: 89.4 fL (ref 80.0–100.0)
Platelets: 239 10*3/uL (ref 150–400)
RBC: 5.1 MIL/uL (ref 4.22–5.81)
RDW: 13.2 % (ref 11.5–15.5)
WBC: 9.8 10*3/uL (ref 4.0–10.5)
nRBC: 0 % (ref 0.0–0.2)

## 2023-05-29 LAB — BASIC METABOLIC PANEL
Anion gap: 9 (ref 5–15)
BUN: 20 mg/dL (ref 8–23)
CO2: 28 mmol/L (ref 22–32)
Calcium: 9.5 mg/dL (ref 8.9–10.3)
Chloride: 102 mmol/L (ref 98–111)
Creatinine, Ser: 1.07 mg/dL (ref 0.61–1.24)
GFR, Estimated: >60 mL/min (ref >60)
Glucose, Bld: 153 mg/dL — ABNORMAL HIGH (ref 70–99)
Potassium: 4.9 mmol/L (ref 3.5–5.1)
Sodium: 139 mmol/L (ref 135–145)

## 2023-05-29 NOTE — Patient Instructions (Addendum)
 Your procedure is scheduled on: 06/07/23 - Friday Report to the Registration Desk on the 1st floor of the [facility]. To find out your arrival time, please call [phone] between 1PM - 3PM on: 06/06/23 - Thursday If your arrival time is 6:00 am, do not arrive before that time as the [facility] entrance doors do not open until 6:00 am.  REMEMBER: Instructions that are not followed completely may result in serious medical risk, up to and including death; or upon the discretion of your surgeon and anesthesiologist your surgery may need to be rescheduled.  Do not eat food or drink any liquids after midnight the night before surgery.  No gum chewing or hard candies.   One week prior to surgery: Stop Anti-inflammatories (NSAIDS) such as Advil, Aleve, Ibuprofen, Motrin, Naproxen, Naprosyn and Aspirin based products such as Excedrin, Goody's Powder, BC Powder.  Stop ANY OVER THE COUNTER supplements until after surgery : VITAMIN D3   You may take Tylenol if needed for pain up until the day of surgery.  HOLD ELIQUIS beginning 06/04/23, may resume taking 06/08/23.  ON THE DAY OF SURGERY ONLY TAKE THESE MEDICATIONS WITH SIPS OF WATER:  TIMOLOL-BRIMON-DORZOL-LATANOPR  ipratropium-albuterol (DUONEB)  TRELEGY ELLIPTA  omeprazole (PRILOSEC)   Use Albuterol Sulfate (PROAIR RESPICLICK)  on the day of surgery and bring to the hospital.  No Alcohol for 24 hours before or after surgery.  No Smoking including e-cigarettes for 24 hours before surgery.  No chewable tobacco products for at least 6 hours before surgery.  No nicotine patches on the day of surgery.  Do not use any "recreational" drugs for at least a week (preferably 2 weeks) before your surgery.  Please be advised that the combination of cocaine and anesthesia may have negative outcomes, up to and including death. If you test positive for cocaine, your surgery will be cancelled.  On the morning of surgery brush your teeth with  toothpaste and water, you may rinse your mouth with mouthwash if you wish. Do not swallow any toothpaste or mouthwash.  Do not wear jewelry, make-up, hairpins, clips or nail polish.  For welded (permanent) jewelry: bracelets, anklets, waist bands, etc.  Please have this removed prior to surgery.  If it is not removed, there is a chance that hospital personnel will need to cut it off on the day of surgery.  Do not wear lotions, powders, or perfumes.   Do not shave body hair from the neck down 48 hours before surgery.  Contact lenses, hearing aids and dentures may not be worn into surgery.  Do not bring valuables to the hospital. [facility] is not responsible for any missing/lost belongings or valuables.   Notify your doctor if there is any change in your medical condition (cold, fever, infection).  Wear comfortable clothing (specific to your surgery type) to the hospital.  After surgery, you can help prevent lung complications by doing breathing exercises.  Take deep breaths and cough every 1-2 hours. Your doctor may order a device called an Incentive Spirometer to help you take deep breaths.  When coughing or sneezing, hold a pillow firmly against your incision with both hands. This is called "splinting." Doing this helps protect your incision. It also decreases belly discomfort.  If you are being admitted to the hospital overnight, leave your suitcase in the car. After surgery it may be brought to your room.  In case of increased patient census, it may be necessary for you, the patient, to continue your  postoperative care in the Same Day Surgery department.  If you are being discharged the day of surgery, you will not be allowed to drive home. You will need a responsible individual to drive you home and stay with you for 24 hours after surgery.   If you are taking public transportation, you will need to have a responsible individual with you.  Please call the Pre-admissions Testing  Dept. at [phone] if you have any questions about these instructions.  Surgery Visitation Policy:  Patients having surgery or a procedure may have two visitors.  Children under the age of 26 must have an adult with them who is not the patient.  Temporary Visitor Restrictions Due to increasing cases of flu, RSV and COVID-19: Children ages 92 and under will not be able to visit patients in [facility] hospitals under most circumstances.  Inpatient Visitation:    Visiting hours are 7 a.m. to 8 p.m. Up to four visitors are allowed at one time in a patient room. The visitors may rotate out with other people during the day.  One visitor age 77 or older may stay with the patient overnight and must be in the room by 8 p.m.

## 2023-05-31 ENCOUNTER — Other Ambulatory Visit

## 2023-06-03 ENCOUNTER — Encounter: Payer: Self-pay | Admitting: Urgent Care

## 2023-06-03 NOTE — Progress Notes (Signed)
 Perioperative / Anesthesia Services  Pre-Admission Testing Clinical Review / Preoperative Anesthesia Consult  Date: 06/03/23  Patient Demographics:  Name: Darryl Jones DOB:   1955/03/22 MRN:   [id number]  Planned Surgical Procedure(s):    Case: [id number] Date/Time: 06/07/23 1354   Procedures:      BRONCHOSCOPY, WITH EBUS     BRONCHOSCOPY, WITH BIOPSY USING ELECTROMAGNETIC NAVIGATION   Anesthesia type: General   Pre-op diagnosis: R91.8, Lung Mass   Location: [facility] PROCEDURE RM 02 / [facility]S FOR ANESTHESIA GROUP   Surgeons: Vida Rigger, MD     NOTE: Available PAT nursing documentation and vital signs have been reviewed. Clinical nursing staff has updated patient's PMH/PSHx, current medication list, and drug allergies/intolerances to ensure comprehensive history available to assist in medical decision making as it pertains to the aforementioned surgical procedure and anticipated anesthetic course. Extensive review of available clinical information personally performed. Darryl Jones PMH and PSHx updated with any diagnoses/procedures that  may have been inadvertently omitted during Darryl Jones intake with the pre-admission testing department's nursing staff.  Clinical Discussion:  Darryl Jones is a 68 y.o. male who is submitted for pre-surgical anesthesia review and clearance prior to him undergoing the above procedure. Patient is a Former Smoker (86 pack years; quit 05/2010). Pertinent PMH includes: CAD, diastolic dysfunction, PAF, complete heart block (s/p PPM placement), BILATERAL carotid artery disease, aortic atherosclerosis, HTN, HLD, T2DM, interstitial rheumatoid disease of the lung, COPD, SOB, LEFT upper lobe pulmonary mass, laryngopharyngeal reflux, RA, glaucoma.  Patient is followed by cardiology Darrold Junker, MD). He was last seen in the cardiology clinic on 04/05/2023; notes reviewed. At the time of Darryl Jones clinic visit, patient doing well overall from a cardiovascular perspective.   Patient with chronic exertional dyspnea related to Darryl Jones underlying COPD diagnosis and previous RIGHT pulmonary wedge resection.  Patient also complaining of mild peripheral edema that is also chronic and reported to be stable and at baseline.  Patient denied any chest pain, PND, orthopnea, palpitations, weakness, fatigue, vertiginous symptoms, or presyncope/syncope. Patient with a past medical history significant for cardiovascular diagnoses. Documented physical exam was grossly benign, providing no evidence of acute exacerbation and/or decompensation of the patient's known cardiovascular conditions.  Patient with a history of complete heart block requiring placement of a implanted cardiac pacemaker.  [employer] Azure XT DR MRI SureScan dual-chamber pacemaker placed on 10/28/2017.  Device is regularly interrogated by patient's primary cardiology/electrophysiology team.  Device was last interrogated on 04/25/2023, at which time he was noted to be functioning properly.  Most recent TTE was performed on 02/17/2021 revealing a mildly reduced left ventricular systolic function with an EF of 45%.  There was septal, apical, and inferior wall hypokinesis. Left ventricular diastolic Doppler parameters consistent with abnormal relaxation (G1DD).  Right ventricle mildly enlarged.  There was trivial to mild mitral, tricuspid, and pulmonary valve regurgitation.  All transvalvular gradients were noted to be normal providing no evidence suggestive of valvular stenosis.  RVSP 36.4 mmHg.  Aorta normal in size with no evidence of aneurysmal dilatation.  Myocardial perfusion imaging study performed on 03/27/2021 revealed normal left ventricular systolic function with an EF of 57%.  There were no regional wall motion abnormalities.  SPECT images demonstrated a small reversible perfusion abnormality of mild intensity present in the inferoapical region consistent with ischemia.  ECG was nondiagnostic.  Patient demonstrated a  hypertensive response to exercise.  Patient with an atrial fibrillation diagnosis; CHA2DS2-VASc Score = 4 (age, HTN, vascular disease history, T2DM). Cardiac rate and  rhythm currently maintained intrinsically without the need for pharmacological intervention. Patient is chronically anticoagulated using apixaban; reported be compliant with therapy with no evidence or reports of GI bleeding.  Blood pressure mildly elevated at 142/78 mmHg on currently prescribed ARB (losartan) monotherapy.  He is on atorvastatin for Darryl Jones HLD diagnosis and ASCVD prevention.  T2DM reasonably controlled on currently prescribed regimen; last HgbA1c was 7.6%.  Patient makes efforts to maintain an active lifestyle, however he does not exercise regularly.  He is able to complete all of Darryl Jones ADLs/IADLs independently without cardiovascular limitation.  Per the [facility], patient is able to complete at least 4 METS of physical activity without experiencing any significant angina/anginal equivalent symptoms.  No changes were made to Darryl Jones medication regimen.  Patient follow-up with outpatient cardiology in 6 months or sooner if needed.  Darryl Jones found to have an irregular area of consolidation in the anterior LEFT upper lobe with surrounding heterogeneous ground glass density.  Finding has been persistent since 05/2021 with slight interval change in morphology.  Additionally, most recent CT imaging of the chest performed on 08/22/2022 revealed a new 10 x 9 mm spiculated pulmonary nodule in the RIGHT lower lobe.  Findings concerning for possible primary bronchogenic carcinoma.  Patient underwent ENB/EBUS in 10/2022 with pathology that was overall negative for malignancy.  Repeat CT imaging of the chest performed on 10/23/2022 revealed a persistent lobulated multinodular opacity in the anterior LEFT upper lobe without significant change from her prior studies.  Radiologist noted that some of the margins were spiculated, which was a new finding  since January.  There was a new indeterminate 8 mm LEFT apical nodule.  Previously demonstrated 9 x 10 mm RIGHT lower lobe nodule resolved from prior exam.  Given the persistent findings, repeat tissue sampling was recommended for further evaluation of suspected malignancy.  Patient has subsequently been scheduled for a repeat VIDEO BRONCHOSCOPY WITH ENDOBRONCHIAL ULTRASOUND; ROBOTIC ASSISTED NAVIGATIONAL BRONCHOSCOPY on 06/07/2023 with Dr. Vida Rigger, MD.    Given patient's past medical history significant for cardiovascular diagnoses, presurgical cardiac clearance was sought by the PAT team. Per cardiology, "this patient is optimized for surgery and may proceed with the planned procedural course with a LOW risk of significant perioperative cardiovascular complications".   Again, this patient is on daily oral anticoagulation therapy using a DOAC medication.  He has been instructed on recommendations for holding Darryl Jones apixaban for 3 days prior to Darryl Jones procedure with plans to restart as soon as postoperative bleeding risk felt to be minimized by Darryl Jones attending surgeon. The patient has been instructed that Darryl Jones last dose of Darryl Jones apixaban should be on 06/03/2023.  Patient denies previous perioperative complications with anesthesia in the past. In review of the available records, it is noted that patient underwent a general anesthetic course here at [facility] in 10/2022 without documented complications.      05/23/2023    1:12 PM 04/03/2023   10:35 AM 03/16/2023   11:49 PM  Vitals with BMI  Height 5\' 6"  5\' 6"  5\' 6"   Weight 205 lbs 6 oz 205 lbs 6 oz 192 lbs  BMI 33.17 33.17 31  Systolic  134   Diastolic  81   Pulse  60     Providers/Specialists:   NOTE: Primary physician provider listed below. Patient may have been seen by APP or partner within same practice.   PROVIDER ROLE / SPECIALTY LAST Yolanda Manges, MD Pulmonary Medicine (Surgeon)  05/13/2023   Georgann Housekeeper, MD Primary Care Provider 05/29/2023  Marcina Millard, MD Cardiology 04/05/2023  Haydee Salter, MD Rheumatology 05/29/2023   Allergies:  Hydroxyquinolines, Loratadine, Other, Pantoprazole sodium, Adalimumab, Hydroxychloroquine, Leflunomide, Methotrexate, Methotrexate derivatives, Prednisone, and Aspirin  Current Home Medications:   No current facility-administered medications for this encounter.    acetaminophen (TYLENOL) 650 MG CR tablet   Albuterol Sulfate (PROAIR RESPICLICK) 108 (90 Base) MCG/ACT AEPB   atorvastatin (LIPITOR) 20 MG tablet   azithromycin (ZITHROMAX) 250 MG tablet   Cholecalciferol (VITAMIN D3) 10 MCG (400 UNIT) CAPS   ELIQUIS 5 MG TABS tablet   Fluticasone-Umeclidin-Vilant (TRELEGY ELLIPTA) 100-62.5-25 MCG/ACT AEPB   glimepiride (AMARYL) 1 MG tablet   ipratropium-albuterol (DUONEB) 0.5-2.5 (3) MG/3ML SOLN   latanoprost (XALATAN) 0.005 % ophthalmic solution   losartan (COZAAR) 50 MG tablet   omeprazole (PRILOSEC) 20 MG capsule   TIMOLOL-BRIMON-DORZOL-LATANOPR OP   Upadacitinib ER (RINVOQ) 15 MG TB24   History:   Past Medical History:  Diagnosis Date   Aortic atherosclerosis (HCC)    Basal cell carcinoma of skin of lip    Basal cell carcinoma of skin of nose    Bilateral carotid artery stenosis    CAD (coronary artery disease)    Cardiogenic shock (HCC)    Chronic bronchitis (HCC)    Community acquired pneumonia    COPD (chronic obstructive pulmonary disease) (HCC)    Diastolic dysfunction 02/17/2021   a.) TTE 02/17/2021: EF 45%, sep/apical/inf HK, mild RVE, triv PR, mild MR/TR, RVSP 36.4, G1DD   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)    a.) s/p surgery 10/02/2013 --> bronchoscopy + RIGHT VATS with diagnostic RUL/RML/RLL wedge resections + RIGHT pleural biopsy, and partial decortication --> pathology consistent with rheumatoid lung disease   Elevated liver enzymes    a.) secondary to use of MTX for RA diagnosis   GERD  (gastroesophageal reflux disease)    Glaucoma    Hearing loss    Hepatic steatosis    History of hiatal hernia    Hyperlipidemia    Hypertension    Laryngopharyngeal reflux    Long term current use of immunosuppressive drug    a.) upadacitinib for RA   Mass of upper lobe of left lung 2025   On apixaban therapy    Osteoporosis    Paroxysmal atrial fibrillation (HCC)    a.) CHA2DS2VASc = 4 (age, HTN, vascular disease history, T2DM);  b.) rate/rhythm maintained intrisically without phamacological intervention; chronically anticoagulated with apixaban   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) 10/28/2017   a.) MDT Azure XT DR MRI SureScan dual chamber PPM (SN: [id number])   Rheumatoid arthritis (HCC)    a.) Tx'd with upadacitinib   Rosacea, acne    Sepsis (HCC)    Shortness of breath    Sleep apnea    a.) does not utilize nocturnal PAP therapy   Third degree AV block (HCC)    a.) s/p MDT dual chamber PPM placement 10/28/2017   Type 2 diabetes mellitus with peripheral angiopathy [facility])    Past Surgical History:  Procedure Laterality Date   ABDOMINAL EXPLORATION SURGERY  1966   undescended testicle; explore for cancer   COLONOSCOPY     polypectomy   Dual-chamber pacemaker 10/27/2017     ESOPHAGOGASTRODUODENOSCOPY     GLAUCOMA SURGERY Right 01/2022   laser (did not take)   HIATAL HERNIA REPAIR  2000   KNEE SURGERY Left 1995   knee cap shattered; wire in  place   liver biopsy 1980's     LUNG SURGERY Right    abscess lung biopsy (right lower)   PACEMAKER INSERTION Left 10/28/2017   Procedure: INSERTION PACEMAKER-INITIAL INSERT DUAL CHAMBER;  Surgeon: Marcina Millard, MD;  Location: [facility] ORS;  Service: Cardiovascular;  Laterality: Left;   SUBMANDIBULAR GLAND EXCISION Right 01/07/2013   Dr Mahlon Gammon GLAND EXCISION Right 01/07/2013   Procedure: REMOVAL OF RIGHT SUBMANDIBULAR GLAND;  Surgeon: Serena Colonel, MD;  Location: [facility] OR;  Service: ENT;   Laterality: Right;   TONSILLECTOMY     child   VIDEO BRONCHOSCOPY WITH ENDOBRONCHIAL ULTRASOUND N/A 10/29/2022   Procedure: VIDEO BRONCHOSCOPY WITH ENDOBRONCHIAL ULTRASOUND;  Surgeon: Vida Rigger, MD;  Location: [facility] ORS;  Service: Thoracic;  Laterality: N/A;   Family History  Problem Relation Age of Onset   Diabetes Sister    Heart disease Sister    Kidney disease Sister    Diabetes Brother    Social History   Tobacco Use   Smoking status: Former    Current packs/day: 0.00    Types: Cigarettes    Quit date: 06/08/2010    Years since quitting: 12.9   Smokeless tobacco: Never   Tobacco comments:    "quit smoking cigarettes in 2012"  Vaping Use   Vaping status: Never Used  Substance Use Topics   Alcohol use: No   Drug use: No    Pertinent Clinical Results:  LABS:   No visits with results within 3 Day(s) from this visit.  Latest known visit with results is:  Hospital Outpatient Visit on 05/29/2023  Component Date Value Ref Range Status   WBC 05/29/2023 9.8  4.0 - 10.5 K/uL Final   RBC 05/29/2023 5.10  4.22 - 5.81 MIL/uL Final   Hemoglobin 05/29/2023 15.7  13.0 - 17.0 g/dL Final   HCT 64/40/3474 45.6  39.0 - 52.0 % Final   MCV 05/29/2023 89.4  80.0 - 100.0 fL Final   MCH 05/29/2023 30.8  26.0 - 34.0 pg Final   MCHC 05/29/2023 34.4  30.0 - 36.0 g/dL Final   RDW 25/95/6387 13.2  11.5 - 15.5 % Final   Platelets 05/29/2023 239  150 - 400 K/uL Final   nRBC 05/29/2023 0.0  0.0 - 0.2 % Final   Performed at [facility], [address] [place], [place] [address]   Sodium 05/29/2023 139  135 - 145 mmol/L Final   Potassium 05/29/2023 4.9  3.5 - 5.1 mmol/L Final   Chloride 05/29/2023 102  98 - 111 mmol/L Final   CO2 05/29/2023 28  22 - 32 mmol/L Final   Glucose, Bld 05/29/2023 153 (H)  70 - 99 mg/dL Final   Glucose reference range applies only to samples taken after fasting for at least 8 hours.   BUN 05/29/2023 20  8 - 23 mg/dL Final   Creatinine, Ser  05/29/2023 1.07  0.61 - 1.24 mg/dL Final   Calcium 29/51/8841 9.5  8.9 - 10.3 mg/dL Final   GFR, Estimated 05/29/2023 >60  >60 mL/min Final   Comment: (NOTE) Calculated using the CKD-EPI Creatinine Equation (2021)    Anion gap 05/29/2023 9  5 - 15 Final   Performed at [facility], [address] [place], [place] [address]    ECG: Date: 05/29/2023 Time ECG obtained: 1054 AM Rate: 60 bpm Rhythm: AV dual paced rhythm  Axis (leads I and aVF): Normal Intervals: PR 178 ms. QRS 178 ms. QTc 484 ms. ST segment and  T wave changes: No evidence of acute ST segment elevation or depression.   Comparison: Similar to previous tracing obtained on 10/23/2022   IMAGING / PROCEDURES: VAS US CAROTID performed on 04/03/2023 Velocities in the right ICA are consistent with a 1-39% stenosis. The ECA appears >50% stenosed.  Velocities in the left ICA are consistent with a 1-39% stenosis.  Bilateral vertebral arteries demonstrate antegrade flow.  Normal flow hemodynamics were seen in bilateral subclavian arteries.   CT CHEST WO CONTRAST performed on 10/23/2022 Persistent lobulated irregularly shaped multinodular opacity in the anterior left upper lobe, without significant change from prior exam. Some of the margins are spiculated. This is new from January, but persistent from intervening prior exams. Tissue sampling is recommended to assess for neoplasm, bronchoscopy has been performed. New 8 mm left apical nodule, indeterminate. Further workup and follow-up dependent on above biopsy results. The previous 9 x 10 mm right lower lobe nodule has resolved from prior exam. Additional small pulmonary nodules are unchanged from prior exam. Emphysema. Coronary artery calcifications and aortic atherosclerosis  CT ABDOMEN PELVIS W CONTRAST performed on 05/25/2022 No acute findings within the abdomen or pelvis. Mild decreased liver attenuation suggesting hepatic steatosis. Aortic  atherosclerosis.  MYOCARDIAL PERFUSION IMAGING STUDY (LEXISCAN) performed on 03/27/2021 Normal left ventricular systolic function with a normal LVEF of 57% Normal myocardial thickening and wall motion Left ventricular cavity size normal SPECT images demonstrate small reversible perfusion abnormality of mild intensity in the inferoapical region consistent with ischemia  No evidence of stress-induced arrhythmia  TRANSTHORACIC ECHOCARDIOGRAM performed on 02/17/2021 Normal left ventricular systolic function with an EF of 45% Septal, apical, and inferior hypokinesis Left ventricular diastolic Doppler parameters consistent with abnormal relaxation (G1DD). Mild right ventricular enlargement Mild MR and TR Trivial PR RVSP = 36.4 mmHg Normal gradients; no valvular stenosis  Impression and Plan:  FREELAND PRACHT has been referred for pre-anesthesia review and clearance prior to him undergoing the planned anesthetic and procedural courses. Available labs, pertinent testing, and imaging results were personally reviewed by me in preparation for upcoming operative/procedural course. [facility] medical record has been updated following extensive record review and patient interview with PAT staff.   This patient has been appropriately cleared by cardiology with an overall LOW risk of experiencing significant perioperative cardiovascular complications. Completed perioperative prescription for cardiac device management documentation completed by primary cardiology team and placed on patient's chart for review by the surgical/anesthetic team on the day of Darryl Jones procedure. Electrophysiology indicating that procedure should not interfere with planned surgical procedure. Beyond normal perioperative cardiovascular monitoring, there are no recommendations from electrophysiology team that prompt further discussion/recommendations from industry representative.   Based on clinical review performed today (06/03/23),  barring any significant acute changes in the patient's overall condition, it is anticipated that he will be able to proceed with the planned surgical intervention. Any acute changes in clinical condition may necessitate Darryl Jones procedure being postponed and/or cancelled. Patient will meet with anesthesia team (MD and/or CRNA) on the day of Darryl Jones procedure for preoperative evaluation/assessment. Questions regarding anesthetic course will be fielded at that time.   Pre-surgical instructions were reviewed with the patient during Darryl Jones PAT appointment, and questions were fielded to satisfaction by PAT clinical staff. He has been instructed on which medications that he will need to hold prior to surgery, as well as the ones that have been deemed safe/appropriate to take on the day of Darryl Jones procedure. As part of the general education provided by PAT, patient made aware  both verbally and in writing, that he would need to abstain from the use of any illegal substances during Darryl Jones perioperative course.  He was advised that failure to follow the provided instructions could necessitate case cancellation or result in serious perioperative complications up to and including death. Patient encouraged to contact PAT and/or Darryl Jones surgeon's office to discuss any questions or concerns that may arise prior to surgery; verbalized understanding.   Quentin Mulling, MSN, APRN, FNP-C, CEN [facility]  Peri-operative Services Nurse Practitioner Phone: [phone] Fax: [phone] 06/03/23 11:04 AM  NOTE: This note has been prepared using Dragon dictation software. Despite my best ability to proofread, there is always the potential that unintentional transcriptional errors may still occur from this process.

## 2023-06-06 ENCOUNTER — Ambulatory Visit
Admission: RE | Admit: 2023-06-06 | Discharge: 2023-06-06 | Disposition: A | Discharge status: Home / Self Care | Source: Ambulatory Visit | Attending: Pulmonary Disease | Admitting: Pulmonary Disease

## 2023-06-06 DIAGNOSIS — J929 Pleural plaque without asbestos: Secondary | ICD-10-CM | POA: Diagnosis not present

## 2023-06-06 DIAGNOSIS — R918 Other nonspecific abnormal finding of lung field: Secondary | ICD-10-CM | POA: Insufficient documentation

## 2023-06-06 DIAGNOSIS — J449 Chronic obstructive pulmonary disease, unspecified: Secondary | ICD-10-CM | POA: Insufficient documentation

## 2023-06-06 DIAGNOSIS — J432 Centrilobular emphysema: Secondary | ICD-10-CM | POA: Diagnosis not present

## 2023-06-07 ENCOUNTER — Other Ambulatory Visit: Payer: Self-pay

## 2023-06-07 ENCOUNTER — Ambulatory Visit

## 2023-06-07 ENCOUNTER — Encounter
Admission: RE | Disposition: A | Discharge status: Home / Self Care | Payer: Self-pay | Source: Home / Self Care | Attending: Pulmonary Disease

## 2023-06-07 ENCOUNTER — Ambulatory Visit: Payer: Self-pay | Admitting: Urgent Care

## 2023-06-07 ENCOUNTER — Ambulatory Visit
Admission: RE | Admit: 2023-06-07 | Discharge: 2023-06-07 | Disposition: A | Discharge status: Home / Self Care | Attending: Pulmonary Disease | Admitting: Pulmonary Disease

## 2023-06-07 DIAGNOSIS — I442 Atrioventricular block, complete: Secondary | ICD-10-CM | POA: Diagnosis not present

## 2023-06-07 DIAGNOSIS — I10 Essential (primary) hypertension: Secondary | ICD-10-CM | POA: Diagnosis not present

## 2023-06-07 DIAGNOSIS — J984 Other disorders of lung: Secondary | ICD-10-CM | POA: Diagnosis not present

## 2023-06-07 DIAGNOSIS — E11621 Type 2 diabetes mellitus with foot ulcer: Secondary | ICD-10-CM

## 2023-06-07 DIAGNOSIS — R0602 Shortness of breath: Secondary | ICD-10-CM | POA: Diagnosis not present

## 2023-06-07 DIAGNOSIS — K219 Gastro-esophageal reflux disease without esophagitis: Secondary | ICD-10-CM | POA: Insufficient documentation

## 2023-06-07 DIAGNOSIS — G4730 Sleep apnea, unspecified: Secondary | ICD-10-CM | POA: Diagnosis not present

## 2023-06-07 DIAGNOSIS — J432 Centrilobular emphysema: Secondary | ICD-10-CM | POA: Diagnosis not present

## 2023-06-07 DIAGNOSIS — R6889 Other general symptoms and signs: Secondary | ICD-10-CM | POA: Diagnosis not present

## 2023-06-07 DIAGNOSIS — J209 Acute bronchitis, unspecified: Secondary | ICD-10-CM | POA: Diagnosis not present

## 2023-06-07 DIAGNOSIS — R918 Other nonspecific abnormal finding of lung field: Secondary | ICD-10-CM | POA: Insufficient documentation

## 2023-06-07 DIAGNOSIS — Z7901 Long term (current) use of anticoagulants: Secondary | ICD-10-CM | POA: Insufficient documentation

## 2023-06-07 DIAGNOSIS — I2510 Atherosclerotic heart disease of native coronary artery without angina pectoris: Secondary | ICD-10-CM | POA: Insufficient documentation

## 2023-06-07 DIAGNOSIS — Z87891 Personal history of nicotine dependence: Secondary | ICD-10-CM | POA: Insufficient documentation

## 2023-06-07 DIAGNOSIS — E1151 Type 2 diabetes mellitus with diabetic peripheral angiopathy without gangrene: Secondary | ICD-10-CM | POA: Diagnosis not present

## 2023-06-07 DIAGNOSIS — I700 Atherosclerosis of aorta: Secondary | ICD-10-CM | POA: Insufficient documentation

## 2023-06-07 DIAGNOSIS — I480 Paroxysmal atrial fibrillation: Secondary | ICD-10-CM | POA: Insufficient documentation

## 2023-06-07 DIAGNOSIS — Z950 Presence of cardiac pacemaker: Secondary | ICD-10-CM | POA: Diagnosis not present

## 2023-06-07 DIAGNOSIS — R0489 Hemorrhage from other sites in respiratory passages: Secondary | ICD-10-CM | POA: Diagnosis not present

## 2023-06-07 DIAGNOSIS — Z01812 Encounter for preprocedural laboratory examination: Secondary | ICD-10-CM

## 2023-06-07 DIAGNOSIS — E785 Hyperlipidemia, unspecified: Secondary | ICD-10-CM | POA: Diagnosis not present

## 2023-06-07 HISTORY — DX: Other long term (current) drug therapy: Z79.899

## 2023-06-07 HISTORY — PX: VIDEO BRONCHOSCOPY WITH ENDOBRONCHIAL ULTRASOUND: SHX6177

## 2023-06-07 HISTORY — DX: Disorder of arteries and arterioles, unspecified: I77.9

## 2023-06-07 HISTORY — DX: Long term (current) use of unspecified immunomodulators and immunosuppressants: Z79.60

## 2023-06-07 LAB — GLUCOSE, CAPILLARY
Glucose-Capillary: 134 mg/dL — ABNORMAL HIGH (ref 70–99)
Glucose-Capillary: 126 mg/dL — ABNORMAL HIGH (ref 70–99)

## 2023-06-07 SURGERY — BRONCHOSCOPY, WITH EBUS
Anesthesia: General

## 2023-06-07 MED ORDER — LACTATED RINGERS IV SOLN: INTRAVENOUS | Status: DC | PRN

## 2023-06-07 MED ORDER — PROPOFOL 10 MG/ML IV BOLUS
INTRAVENOUS | Status: DC | PRN
  Administered 2023-06-07: 150 mg via INTRAVENOUS

## 2023-06-07 MED ORDER — ORAL CARE MOUTH RINSE: 15.0000 mL | Freq: Once | OROMUCOSAL | Status: AC

## 2023-06-07 MED ORDER — CHLORHEXIDINE GLUCONATE 0.12 % MT SOLN
OROMUCOSAL | Status: AC
  Filled 2023-06-07: qty 15

## 2023-06-07 MED ORDER — FENTANYL CITRATE (PF) 100 MCG/2ML IJ SOLN
INTRAMUSCULAR | Status: AC
  Filled 2023-06-07: qty 2

## 2023-06-07 MED ORDER — LIDOCAINE HCL (CARDIAC) PF 100 MG/5ML IV SOSY
PREFILLED_SYRINGE | INTRAVENOUS | Status: DC | PRN
  Administered 2023-06-07: 60 mg via INTRAVENOUS

## 2023-06-07 MED ORDER — SODIUM CHLORIDE 0.9% FLUSH: 3.0000 mL | INTRAVENOUS | Status: DC | PRN

## 2023-06-07 MED ORDER — FENTANYL CITRATE (PF) 100 MCG/2ML IJ SOLN
INTRAMUSCULAR | Status: DC | PRN
  Administered 2023-06-07 (×2): 50 ug via INTRAVENOUS

## 2023-06-07 MED ORDER — ROCURONIUM BROMIDE 100 MG/10ML IV SOLN
INTRAVENOUS | Status: DC | PRN
  Administered 2023-06-07: 50 mg via INTRAVENOUS
  Administered 2023-06-07: 20 mg via INTRAVENOUS

## 2023-06-07 MED ORDER — MIDAZOLAM HCL 2 MG/2ML IJ SOLN
INTRAMUSCULAR | Status: DC | PRN
Start: 2023-06-07 — End: 2023-06-07
  Administered 2023-06-07: 2 mg via INTRAVENOUS

## 2023-06-07 MED ORDER — CHLORHEXIDINE GLUCONATE 0.12 % MT SOLN
15.0000 mL | Freq: Once | OROMUCOSAL | Status: AC
  Administered 2023-06-07: 15 mL via OROMUCOSAL

## 2023-06-07 MED ORDER — DEXAMETHASONE SODIUM PHOSPHATE 10 MG/ML IJ SOLN
INTRAMUSCULAR | Status: DC | PRN
  Administered 2023-06-07: 5 mg via INTRAVENOUS

## 2023-06-07 MED ORDER — ONDANSETRON HCL 4 MG/2ML IJ SOLN
INTRAMUSCULAR | Status: DC | PRN
  Administered 2023-06-07: 4 mg via INTRAVENOUS

## 2023-06-07 MED ORDER — SODIUM CHLORIDE 0.9% FLUSH: 3.0000 mL | Freq: Two times a day (BID) | INTRAVENOUS | Status: DC

## 2023-06-07 MED ORDER — PROPOFOL 10 MG/ML IV BOLUS
INTRAVENOUS | Status: AC
Start: 2023-06-07 — End: ?
  Filled 2023-06-07: qty 20

## 2023-06-07 MED ORDER — SUGAMMADEX SODIUM 200 MG/2ML IV SOLN
INTRAVENOUS | Status: DC | PRN
  Administered 2023-06-07: 200 mg via INTRAVENOUS

## 2023-06-07 MED ORDER — MIDAZOLAM HCL 2 MG/2ML IJ SOLN
INTRAMUSCULAR | Status: AC
  Filled 2023-06-07: qty 2

## 2023-06-07 MED ORDER — EPHEDRINE SULFATE-NACL 50-0.9 MG/10ML-% IV SOSY
PREFILLED_SYRINGE | INTRAVENOUS | Status: DC | PRN
  Administered 2023-06-07 (×2): 5 mg via INTRAVENOUS

## 2023-06-07 NOTE — Procedures (Signed)
 ROBOTIC ASSISTED NAVIGATIONAL BRONCHOSCOPY PROCEDURE NOTE  FIBEROPTIC BRONCHOSCOPY WITH BRONCHOALVEOLAR LAVAGE PROCEDURE NOTE  ENDOBRONCHIAL ULTRASOUND PROCEDURE NOTE  FLEXIBLE BRONCHOSCOPY WITH THERAPEUTIC ASPIRATION OF TRACHEOBRONCHIAL TREE PROCEDURE NOTE  Flexible bronchoscopy was performed  by : Karna Christmas MD  assistance by : 1)Repiratory therapist  and 2) cytotech staff and 3) Anesthesia team and 4) Flouroscopy team and 5) INTUIT supporting staff   Indication for the procedure was :  Pre-procedural H&P. The following assessment was performed on the day of the procedure prior to initiating sedation History:  Chest pain n Dyspnea y Hemoptysis n Cough y Fever n Other pertinent items n  Examination Vital signs -reviewed as per nursing documentation today Cardiac    Murmurs: n  Rubs : n  Gallop: n Lungs Wheezing: n Rales : n Rhonchi :y  Other pertinent findings: SOB/hypoxemia due to chronic lung disease   Pre-procedural assessment for Procedural Sedation included: Depth of sedation: As per anesthesia team  ASA Classification:  2 Mallampati airway assessment: 3    Medication list reviewed: y  The patient's interval history was taken and revealed: no new complaints The pre- procedure physical examination revealed: No new findings Refer to prior clinic note for details.  Informed Consent: Informed consent was obtained from:  patient after explanation of procedure and risks, benefits, as well as alternative procedures available.  Explanation of level of sedation and possible transfusion was also provided.    Procedural Preparation: Time out was performed and patient was identified by name and birthdate and procedure to be performed and side for sampling, if any, was specified. Pt was intubated by anesthesia.  The patient was appropriately draped.   Fiberoptic bronchoscopy with airway inspection and BAL Procedure findings AND Flexible bronchoscopy with therapeutic  aspiration of tracheobronchial tree: f Bronchoscope was inserted via ETT  without difficulty.  Posterior oropharynx, epiglottis, arytenoids, false cords and vocal cords were not visualized as these were bypassed by endotracheal tube. The distal trachea was normal in circumference and appearance without mucosal, cartilaginous or branching abnormalities.  The main carina was mildly splayed . Mucus plugging was noted at left upper lobe which was suctioned and treated with therapeutic aspiration of tracheobronchial tree.  All right and left lobar airways were visualized to the Subsegmental level.  Sub- sub segmental carinae were identified in all the distal airways.   Secretions were visible in the following airways and appeared to be clear.  The mucosa was : friable at left upper lobe   Airways were notable for:        exophytic lesions :n       extrinsic compression in the following distributions: n.       Friable mucosa: y       Teacher, music /pigmentation: n     Post procedure Diagnosis:   Mucus plugging of left upper lobe    Robotic assisted Navigational Bronchoscopy Procedure Findings:   Post appropriate planning and registration peripheral navigation was used to visualize target lesion.    TARGET 1 - POSTERIOR LUL - TRANSBRONCHIAL FNA X 3 AND TRANSBRONCHIAL FORCEPS PATHOLOGY X 4   TARGET 2 - ANTERIOR LUL - FNA X 5 TRANBRONCHIAL    Post procedure diagnosis:  - CYTOTECH WAS UNSURE - SPECIMENS SENT FOR FINAL READ TO PATHOLGY       Endobronchial ultrasound assisted hilar and mediastinal lymph node biopsies procedure findings: The fiberoptic bronchoscope was removed and the EBUS scope was introduced. Examination began to evaluate for pathologically enlarged lymph nodes starting on  the RIGHT side progressing to the LEFT side.  All lymph node biopsies performed with 21G needle. Lymph node biopsies were sent in cytolite for all stations.   Post procedure diagnosis:  NORMAL  LYMPH NODES DID NOT DO BIPSIES   Specimens obtained included:  Broncho-alveolar lavage site:LUL  sent for CYTOLOGY                              30 ml volume infused 15ml volume returned with [facility] - [facility] appearance  Fluoroscopy Used: YES ;        Pictorial documentation attached: NONE                  Transbronchial WANG needle aspiration site: YES  sent for: CYTOLOGY                                      Immediate sampling complications included:NONE  Epinephrine NONE ml was used topically  The bronchoscopy was terminated due to completion of the planned procedure and the bronchoscope was removed.   Total dosage of Lidocaine was NONE mg Total fluoroscopy time was AS PER RADIOLOGY  minutes  Supplemental oxygen was provided at AS PER ANESTHESIA lpm by nasal canula post operatively  Estimated Blood loss: <2 CCcc.  Complications included:  NONE IMMEDIATE   Preliminary CXR findings :  IN PROCESS  Disposition: HOME WITH WIFE  Follow up with Dr. Karna Christmas in 5 days for result discussion.     Vida Rigger MD  [facility] Division of Pulmonary & Critical Care Medicine

## 2023-06-07 NOTE — Transfer of Care (Signed)
 Immediate Anesthesia Transfer of Care Note  Patient: Darryl Jones  Procedure(s) Performed: BRONCHOSCOPY, WITH EBUS BRONCHOSCOPY, WITH BIOPSY USING ELECTROMAGNETIC NAVIGATION  Patient Location: PACU  Anesthesia Type:General  Level of Consciousness: awake and alert   Airway & Oxygen Therapy: Patient Spontanous Breathing and Patient connected to face mask oxygen  Post-op Assessment: Report given to RN and Post -op Vital signs reviewed and stable  Post vital signs: Reviewed and stable  Last Vitals:  Vitals Value Taken Time  BP 155/66 06/07/23 1200  Temp    Pulse 59 06/07/23 1202  Resp 14 06/07/23 1202  SpO2 100 % 06/07/23 1202  Vitals shown include unfiled device data.  Last Pain:  Vitals:   06/07/23 0843  TempSrc: Temporal  PainSc: 0-No pain         Complications: No notable events documented.

## 2023-06-07 NOTE — Anesthesia Procedure Notes (Signed)
 Procedure Name: Intubation Date/Time: 06/07/2023 10:10 AM  Performed by: Monico Hoar, CRNAPre-anesthesia Checklist: Patient identified, Patient being monitored, Timeout performed, Emergency Drugs available and Suction available Patient Re-evaluated:Patient Re-evaluated prior to induction Oxygen Delivery Method: Circle system utilized Preoxygenation: Pre-oxygenation with 100% oxygen Induction Type: IV induction Ventilation: Mask ventilation without difficulty Laryngoscope Size: McGrath and 4 Grade View: Grade I Tube type: Oral Tube size: 8.5 mm Number of attempts: 1 Airway Equipment and Method: Stylet Placement Confirmation: ETT inserted through vocal cords under direct vision, positive ETCO2 and breath sounds checked- equal and bilateral Secured at: 21 cm Tube secured with: Tape Dental Injury: Teeth and Oropharynx as per pre-operative assessment

## 2023-06-07 NOTE — H&P (Signed)
 PULMONOLOGY         Date: 06/07/2023,   MRN# [id number] ABRAHIM SARGENT 31-May-1955     CHIEF COMPLAINT:   Left upper lobe lung nodules   HISTORY OF PRESENT ILLNESS   Patient is 68 yo M with hx of COPD and additional PMH as indicated below.  He has signifincant sized LUL nodule which he have monitored.  He ha DOE and we discussed previous evaluation with biopsy showing no malignancy.  On pathology previously this was showing reactive squamous metaplasia.  There was concern regarding possible malignancy and we are here today to biopsy this area of anterior LUL as well as microcavitary lesion of posterior LUL.  Additionally we will perform airway inspection and EBUS for Lymph node station evaluation and biopsies if necessary.     -Reviewed risks/complications and benefits with patient, risks include infection, pneumothorax/pneumomediastinum which may require chest tube placement as well as overnight/prolonged hospitalization and possible mechanical ventilation. Other risks include bleeding and very rarely death.  Patient understands risks and wishes to proceed.  Additional questions were answered, and patient is aware that post procedure patient will be going home with family and may experience cough with possible clots on expectoration as well as phlegm which may last few days as well as hoarseness of voice post intubation and mechanical ventilation.    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)    a.) s/p surgery 10/02/2013 --> bronchoscopy + RIGHT VATS with diagnostic RUL/RML/RLL wedge resections + RIGHT pleural biopsy, and partial decortication --> pathology consistent with rheumatoid lung disease   Elevated liver enzymes    a.) secondary to use of MTX for RA diagnosis   GERD (gastroesophageal reflux disease)    Glaucoma    Hearing loss    Hepatic steatosis    History of hiatal hernia    Hyperlipidemia    Hypertension    Laryngopharyngeal reflux    Long term current use of immunosuppressive drug    a.) upadacitinib for RA   Mass of upper lobe of left lung 2025   On apixaban therapy    Osteoporosis    Paroxysmal atrial fibrillation (HCC)    a.) CHA2DS2VASc = 4 (age, HTN, vascular disease history, T2DM);  b.) rate/rhythm maintained intrisically without phamacological intervention; chronically anticoagulated with apixaban   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) 10/28/2017   a.) MDT Azure XT DR MRI SureScan dual chamber PPM (SN: [id number])   Rheumatoid arthritis (HCC)    a.) Tx'd with upadacitinib   Rosacea, acne    Sepsis (HCC)    Shortness of breath    Sleep apnea    a.) does not utilize nocturnal PAP therapy   Third degree AV block (HCC)    a.) s/p MDT dual chamber PPM placement 10/28/2017   Type 2 diabetes  mellitus with peripheral angiopathy (HCC)      SURGICAL HISTORY   Past Surgical History:  Procedure Laterality Date   ABDOMINAL EXPLORATION SURGERY  1966   undescended testicle; explore for cancer   COLONOSCOPY     polypectomy   Dual-chamber pacemaker 10/27/2017     ESOPHAGOGASTRODUODENOSCOPY     GLAUCOMA SURGERY Right 01/2022   laser (did not take)   HIATAL HERNIA REPAIR  2000   KNEE SURGERY Left 1995   knee cap shattered; wire in place   liver biopsy 1980's     LUNG SURGERY Right    abscess lung biopsy (right lower)   PACEMAKER INSERTION Left  10/28/2017   Procedure: INSERTION PACEMAKER-INITIAL INSERT DUAL CHAMBER;  Surgeon: Marcina Millard, MD;  Location: [facility];  Service: Cardiovascular;  Laterality: Left;   SUBMANDIBULAR GLAND EXCISION Right 01/07/2013   Dr Mahlon Gammon GLAND EXCISION Right 01/07/2013   Procedure: REMOVAL OF RIGHT SUBMANDIBULAR GLAND;  Surgeon: Serena Colonel, MD;  Location: [facility] OR;  Service: ENT;  Laterality: Right;   TONSILLECTOMY     child   VIDEO BRONCHOSCOPY WITH ENDOBRONCHIAL ULTRASOUND N/A 10/29/2022   Procedure: VIDEO BRONCHOSCOPY WITH ENDOBRONCHIAL ULTRASOUND;  Surgeon: Vida Rigger, MD;  Location: [facility] OR[facility];  Service: Thoracic;  Laterality: N/A;     FAMILY HISTORY   Family History  Problem Relation Age of Onset   Diabetes Sister    Heart disease Sister    Kidney disease Sister    Diabetes Brother      SOCIAL HISTORY   Social History   Tobacco Use   Smoking status: Former    Current packs/day: 0.00    Types: Cigarettes    Quit date: 06/08/2010    Years since quitting: 13.0   Smokeless tobacco: Never   Tobacco comments:    "quit smoking cigarettes in 2012"  Vaping Use   Vaping status: Never Used  Substance Use Topics   Alcohol use: No   Drug use: No     MEDICATIONS    Home Medication:    Current Medication:  Current Facility-Administered Medications:    chlorhexidine (PERIDEX) 0.12 % solution 15 mL, 15 mL, Mouth/Throat, Once **OR** Oral care mouth rinse, 15 mL, Mouth Rinse, Once, Lenard Simmer, MD   sodium chloride flush (NS) 0.9 % injection 3-10 mL, 3-10 mL, Intravenous, Q12H, Lenard Simmer, MD   sodium chloride flush (NS) 0.9 % injection 3-10 mL, 3-10 mL, Intravenous, PRN, Lenard Simmer, MD    ALLERGIES   Hydroxyquinolines, Loratadine, Other, Pantoprazole sodium, Adalimumab, Hydroxychloroquine, Leflunomide, Methotrexate, Methotrexate derivatives, Prednisone, and Aspirin     REVIEW OF SYSTEMS    Review of Systems:  Gen:  Denies  fever,  sweats, chills weigh loss  HEENT: Denies blurred vision, double vision, ear pain, eye pain, hearing loss, nose bleeds, sore throat Cardiac:  No dizziness, chest pain or heaviness, chest tightness,edema Resp:   reports dyspnea chronically  Gi: Denies swallowing difficulty, stomach pain, nausea or vomiting, diarrhea, constipation, bowel incontinence Gu:  Denies bladder incontinence, burning urine Ext:   Denies Joint pain, stiffness or swelling Skin: Denies  skin rash, easy bruising or bleeding or hives Endoc:  Denies polyuria, polydipsia , polyphagia or weight change Psych:   Denies depression, insomnia or hallucinations   Other:  All other systems negative   VS: There were no vitals taken for this visit.     PHYSICAL EXAM    GENERAL:NAD, no fevers, chills, no weakness no fatigue HEAD: Normocephalic, atraumatic.  EYES: Pupils  equal, round, reactive to light. Extraocular muscles intact. No scleral icterus.  MOUTH: Moist mucosal membrane. Dentition intact. No abscess noted.  EAR, NOSE, THROAT: Clear without exudates. No external lesions.  NECK: Supple. No thyromegaly. No nodules. No JVD.  PULMONARY: decreased breath sounds with mild rhonchi worse at bases bilaterally.  CARDIOVASCULAR: S1 and S2. Regular rate and rhythm. No murmurs, rubs, or gallops. No edema. Pedal pulses 2+ bilaterally.  GASTROINTESTINAL: Soft, nontender, nondistended. No masses. Positive bowel sounds. No hepatosplenomegaly.  MUSCULOSKELETAL: No swelling, clubbing, or edema. Range of motion full in all extremities.  NEUROLOGIC: Cranial nerves II through XII are intact. No gross focal neurological deficits. Sensation intact. Reflexes intact.  SKIN: No ulceration, lesions, rashes, or cyanosis. Skin warm and dry. Turgor intact.  PSYCHIATRIC: Mood, affect within normal limits. The patient is awake, alert and oriented x 3. Insight, judgment intact.       IMAGING   rrative & Impression  CLINICAL DATA:  COPD.  Lung  mass. * Tracking Code: BO *   EXAM: CT CHEST WITHOUT CONTRAST   TECHNIQUE: Multidetector CT imaging of the chest was performed using thin slice collimation for electromagnetic bronchoscopy planning purposes, without intravenous contrast.   RADIATION DOSE REDUCTION: This exam was performed according to the departmental dose-optimization program which includes automated exposure control, adjustment of the mA and/or kV according to patient size and/or use of iterative reconstruction technique.   COMPARISON:  10/23/2022   FINDINGS: Cardiovascular: Dual lead pacer. Aortic atherosclerosis. Mild cardiomegaly, without pericardial effusion. Left main and left circumflex coronary artery calcification.   Mediastinum/Nodes: No supraclavicular adenopathy. small AP window nodes are unchanged. No mediastinal or hilar adenopathy, given limitations of unenhanced CT.   Lungs/Pleura: Mild right pleural thickening.  No pleural fluid.   Moderate centrilobular emphysema.   Right lower lobe subpleural pulmonary nodule is similar at 3 mm on 87/4.   Right apical surgical sutures.   Right middle lobe wedge resection as well.   Subpleural 2 mm posterior right upper lobe pulmonary nodule is most likely a subpleural lymph node and is unchanged.   2 mm right apical pulmonary nodule posteriorly is unchanged.   New posterior left upper lobe irregular consolidation and mild surrounding ground-glass, maximally 2.1 cm on 39/4.   The 8 mm left upper lobe pulmonary nodule on the prior exam may be obscured by this consolidation or may have decreased in size, with a 4 mm nodule in this region identified on 39/4.   Anteromedial left upper lobe macrolobulated lung mass measures 4.0 x 2.0 cm on 67/4 versus 3.9 x 2.1 cm on the prior exam (when remeasured). 2.9 cm craniocaudal on coronal image 71 today versus 2.8 cm when remeasured in a similar fashion. Contiguous nodular component anteromedially measures 9  mm on 70/4 and is unchanged.   Probable subpleural lymph node in the anterior left lower lobe at 2-3 mm, similar.   Upper Abdomen: Normal imaged portions of the liver, stomach, pancreas, gallbladder, adrenal glands, kidneys. A subcapsular hypoattenuating splenic lesion of 8 mm is similar, likely a cyst or lymphangioma and of doubtful clinical significance.   Musculoskeletal: Mild bilateral gynecomastia. No acute osseous abnormality. Remote left rib fractures.   IMPRESSION: 1. Macrolobulated left upper lobe lung mass is similar to 10/23/2022. Morphology again favors neoplasm, although relative stability for 7 months would be somewhat atypical. 2. No evidence of metastatic disease. 3. New posterior left upper lobe probable nodular consolidation, favoring infection. This may encompass the previously described left upper lobe  pulmonary nodule. Consider antibiotic therapy and chest CT follow-up at 4-6 weeks. 4. Other pulmonary nodules are unchanged in the setting of prior right sided wedge resections. 5. Incidental findings, including: Aortic atherosclerosis (ICD10-I70.0), coronary artery atherosclerosis and emphysema (ICD10-J43.9).     Electronically Signed   By: Jeronimo Greaves M.D.   On: 06/06/2023 18:34    ASSESSMENT/PLAN   Left upper lobe lung mass     -favors neoplasm per radiologist report     - plan for robotic assisted navigational bronchoscopy      - flexible bronchoscopy with aspiration of tracheobronchial tree     - bronchoalveolar lavage      - endobronchial [place]      -Reviewed risks/complications and benefits with patient, risks include infection, pneumothorax/pneumomediastinum which may require chest tube placement as well as overnight/prolonged hospitalization and possible mechanical ventilation. Other risks include bleeding and very rarely death.  Patient understands risks and wishes to proceed.  Additional questions were answered, and patient is aware that post  procedure patient will be going home with family and may experience cough with possible clots on expectoration as well as phlegm which may last few days as well as hoarseness of voice post intubation and mechanical ventilation.             Thank you for allowing me to participate in the care of this patient.   Patient/Family are satisfied with care plan and all questions have been answered.    Provider disclosure: Patient with at least one acute or chronic illness or injury that poses a threat to life or bodily function and is being managed actively during this encounter.  All of the below services have been performed independently by signing provider:  review of prior documentation from internal and or external health records.  Review of previous and current lab results.  Interview and comprehensive assessment during patient visit today. Review of current and previous chest radiographs/CT scans. Discussion of management and test interpretation with health care team and patient/family.   This document was prepared using Dragon voice recognition software and may include unintentional dictation errors.     Vida Rigger, M.D.  Division of Pulmonary & Critical Care Medicine

## 2023-06-07 NOTE — Anesthesia Preprocedure Evaluation (Signed)
 Anesthesia Evaluation  Patient identified by MRN, date of birth, ID band Patient awake    Reviewed: Allergy & Precautions, NPO status , Patient's Chart, lab work & pertinent test results  History of Anesthesia Complications Negative for: history of anesthetic complications  Airway Mallampati: III  TM Distance: <3 FB Neck ROM: full    Dental  (+) Missing   Pulmonary shortness of breath and with exertion, sleep apnea , COPD, former smoker   Pulmonary exam normal        Cardiovascular hypertension, + CAD  Normal cardiovascular exam+ dysrhythmias + pacemaker      Neuro/Psych  Neuromuscular disease  negative psych ROS   GI/Hepatic Neg liver ROS, hiatal hernia,GERD  Controlled,,  Endo/Other  diabetes, Type 2    Renal/GU      Musculoskeletal   Abdominal   Peds  Hematology negative hematology ROS (+)   Anesthesia Other Findings Past Medical History: No date: Aortic atherosclerosis (HCC) No date: Basal cell carcinoma of skin of lip No date: Basal cell carcinoma of skin of nose No date: Bilateral carotid artery disease (HCC) No date: Bilateral carotid artery stenosis No date: CAD (coronary artery disease) No date: Cardiogenic shock (HCC) No date: Chronic bronchitis (HCC) No date: Community acquired pneumonia No date: COPD (chronic obstructive pulmonary disease) (HCC) 02/17/2021: Diastolic dysfunction     Comment:  a.) TTE 02/17/2021: EF 45%, sep/apical/inf HK, mild RVE,              triv PR, mild MR/TR, RVSP 36.4, G1DD No date: 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)     Comment:  a.) s/p surgery 10/02/2013 --> bronchoscopy + RIGHT VATS               with diagnostic RUL/RML/RLL wedge resections + RIGHT               pleural biopsy, and partial decortication --> pathology               consistent with rheumatoid lung disease No date: Elevated liver enzymes     Comment:  a.) secondary to use of MTX for RA  diagnosis No date: GERD (gastroesophageal reflux disease) No date: Glaucoma No date: Hearing loss No date: Hepatic steatosis No date: History of hiatal hernia No date: Hyperlipidemia No date: Hypertension No date: Laryngopharyngeal reflux No date: Long term current use of immunosuppressive drug     Comment:  a.) upadacitinib for RA 2025: Mass of upper lobe of left lung No date: On apixaban therapy No date: Osteoporosis No date: Paroxysmal atrial fibrillation (HCC)     Comment:  a.) CHA2DS2VASc = 4 (age, HTN, vascular disease history,              T2DM);  b.) rate/rhythm maintained intrisically without               phamacological intervention; chronically anticoagulated               with apixaban 10/28/2017: 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)     Comment:  a.) MDT Azure XT DR MRI SureScan dual chamber PPM (SN:               [id number]) No date: Rheumatoid arthritis (HCC)     Comment:  a.) Tx'd with upadacitinib No date: Rosacea, acne No date: Sepsis (HCC) No date: Shortness of breath No date: Sleep apnea     Comment:  a.) does not utilize nocturnal PAP therapy No date:  Third degree AV block (HCC)     Comment:  a.) s/p MDT dual chamber PPM placement 10/28/2017 No date: Type 2 diabetes mellitus with peripheral angiopathy [facility])  Past Surgical History: 1966: ABDOMINAL EXPLORATION SURGERY     Comment:  undescended testicle; explore for cancer No date: COLONOSCOPY     Comment:  polypectomy No date: Dual-chamber pacemaker 10/27/2017 No date: ESOPHAGOGASTRODUODENOSCOPY 01/2022: GLAUCOMA SURGERY; Right     Comment:  laser (did not take) 2000: HIATAL HERNIA REPAIR 1995: KNEE SURGERY; Left     Comment:  knee cap shattered; wire in place No date: liver biopsy 1980's No date: LUNG SURGERY; Right     Comment:  abscess lung biopsy (right lower) 10/28/2017: PACEMAKER INSERTION; Left     Comment:  Procedure: INSERTION PACEMAKER-INITIAL INSERT DUAL                CHAMBER;  Surgeon: Marcina Millard, MD;  Location:               [facility] ORS;  Service: Cardiovascular;  Laterality: Left; 01/07/2013: SUBMANDIBULAR GLAND EXCISION; Right     Comment:  Dr Pollyann Kennedy 01/07/2013: SUBMANDIBULAR GLAND EXCISION; Right     Comment:  Procedure: REMOVAL OF RIGHT SUBMANDIBULAR GLAND;                Surgeon: Serena Colonel, MD;  Location: [facility] OR;  Service:               ENT;  Laterality: Right; No date: TONSILLECTOMY     Comment:  child 10/29/2022: VIDEO BRONCHOSCOPY WITH ENDOBRONCHIAL ULTRASOUND; N/A     Comment:  Procedure: VIDEO BRONCHOSCOPY WITH ENDOBRONCHIAL               ULTRASOUND;  Surgeon: Vida Rigger, MD;  Location:               [facility] ORS;  Service: Thoracic;  Laterality: N/A;     Reproductive/Obstetrics negative OB ROS                             Anesthesia Physical Anesthesia Plan  ASA: 4  Anesthesia Plan: General ETT   Post-op Pain Management:    Induction: Intravenous  PONV Risk Score and Plan: Ondansetron, Dexamethasone, Midazolam and Treatment may vary due to age or medical condition  Airway Management Planned: Oral ETT  Additional Equipment:   Intra-op Plan:   Post-operative Plan: Extubation in OR and Possible Post-op intubation/ventilation  Informed Consent: I have reviewed the patients History and Physical, chart, labs and discussed the procedure including the risks, benefits and alternatives for the proposed anesthesia with the patient or authorized representative who has indicated his/her understanding and acceptance.     Dental Advisory Given  Plan Discussed with: Anesthesiologist, CRNA and Surgeon  Anesthesia Plan Comments: (Patient consented for risks of anesthesia including but not limited to:  - adverse reactions to medications - damage to eyes, teeth, lips or other oral mucosa - nerve damage due to positioning  - sore throat or hoarseness - Damage to heart, brain, nerves, lungs, other parts  of body or loss of life  Patient voiced understanding and assent.)       Anesthesia Quick Evaluation

## 2023-06-08 NOTE — Anesthesia Postprocedure Evaluation (Signed)
 Anesthesia Post Note  Patient: Darryl Jones  Procedure(s) Performed: BRONCHOSCOPY, WITH EBUS BRONCHOSCOPY, WITH BIOPSY USING ELECTROMAGNETIC NAVIGATION  Patient location during evaluation: PACU Anesthesia Type: General Level of consciousness: awake and alert Pain management: pain level controlled Vital Signs Assessment: post-procedure vital signs reviewed and stable Respiratory status: spontaneous breathing, nonlabored ventilation, respiratory function stable and patient connected to nasal cannula oxygen Cardiovascular status: blood pressure returned to baseline and stable Postop Assessment: no apparent nausea or vomiting Anesthetic complications: no   No notable events documented.   Last Vitals:  Vitals:   06/07/23 1300 06/07/23 1307  BP: 137/73 (!) 148/85  Pulse: 60 (!) 55  Resp: 14 16  Temp:    SpO2: 100% 96%    Last Pain:  Vitals:   06/07/23 1307  TempSrc:   PainSc: 0-No pain                 Cleda Mccreedy Amaris Garrette

## 2023-06-09 ENCOUNTER — Encounter: Payer: Self-pay | Admitting: Pulmonary Disease

## 2023-06-10 LAB — SURGICAL PATHOLOGY

## 2023-06-11 LAB — CYTOLOGY - NON PAP

## 2023-07-08 DIAGNOSIS — I480 Paroxysmal atrial fibrillation: Secondary | ICD-10-CM | POA: Diagnosis not present

## 2023-07-08 DIAGNOSIS — E782 Mixed hyperlipidemia: Secondary | ICD-10-CM | POA: Diagnosis not present

## 2023-07-08 DIAGNOSIS — I10 Essential (primary) hypertension: Secondary | ICD-10-CM | POA: Diagnosis not present

## 2023-07-08 DIAGNOSIS — G4733 Obstructive sleep apnea (adult) (pediatric): Secondary | ICD-10-CM | POA: Diagnosis not present

## 2023-07-08 DIAGNOSIS — D6869 Other thrombophilia: Secondary | ICD-10-CM | POA: Diagnosis not present

## 2023-07-08 DIAGNOSIS — M069 Rheumatoid arthritis, unspecified: Secondary | ICD-10-CM | POA: Diagnosis not present

## 2023-07-08 DIAGNOSIS — E1142 Type 2 diabetes mellitus with diabetic polyneuropathy: Secondary | ICD-10-CM | POA: Diagnosis not present

## 2023-07-08 DIAGNOSIS — J449 Chronic obstructive pulmonary disease, unspecified: Secondary | ICD-10-CM | POA: Diagnosis not present

## 2023-07-26 DIAGNOSIS — Z09 Encounter for follow-up examination after completed treatment for conditions other than malignant neoplasm: Secondary | ICD-10-CM | POA: Diagnosis not present

## 2023-07-26 DIAGNOSIS — D120 Benign neoplasm of cecum: Secondary | ICD-10-CM | POA: Diagnosis not present

## 2023-07-26 DIAGNOSIS — D123 Benign neoplasm of transverse colon: Secondary | ICD-10-CM | POA: Diagnosis not present

## 2023-07-26 DIAGNOSIS — D125 Benign neoplasm of sigmoid colon: Secondary | ICD-10-CM | POA: Diagnosis not present

## 2023-07-26 DIAGNOSIS — D122 Benign neoplasm of ascending colon: Secondary | ICD-10-CM | POA: Diagnosis not present

## 2023-07-26 DIAGNOSIS — Z860101 Personal history of adenomatous and serrated colon polyps: Secondary | ICD-10-CM | POA: Diagnosis not present

## 2023-07-30 DIAGNOSIS — D122 Benign neoplasm of ascending colon: Secondary | ICD-10-CM | POA: Diagnosis not present

## 2023-07-30 DIAGNOSIS — D123 Benign neoplasm of transverse colon: Secondary | ICD-10-CM | POA: Diagnosis not present

## 2023-07-30 DIAGNOSIS — D125 Benign neoplasm of sigmoid colon: Secondary | ICD-10-CM | POA: Diagnosis not present

## 2023-07-30 DIAGNOSIS — D120 Benign neoplasm of cecum: Secondary | ICD-10-CM | POA: Diagnosis not present

## 2023-08-26 ENCOUNTER — Ambulatory Visit: Admitting: Podiatry

## 2023-08-26 ENCOUNTER — Encounter: Payer: Self-pay | Admitting: Podiatry

## 2023-08-26 VITALS — Ht 66.0 in | Wt 205.4 lb

## 2023-08-26 DIAGNOSIS — L84 Corns and callosities: Secondary | ICD-10-CM | POA: Diagnosis not present

## 2023-08-26 DIAGNOSIS — E1142 Type 2 diabetes mellitus with diabetic polyneuropathy: Secondary | ICD-10-CM | POA: Diagnosis not present

## 2023-08-26 DIAGNOSIS — Q828 Other specified congenital malformations of skin: Secondary | ICD-10-CM

## 2023-08-26 DIAGNOSIS — M79676 Pain in unspecified toe(s): Secondary | ICD-10-CM

## 2023-08-26 DIAGNOSIS — B351 Tinea unguium: Secondary | ICD-10-CM | POA: Diagnosis not present

## 2023-09-01 ENCOUNTER — Encounter: Payer: Self-pay | Admitting: Podiatry

## 2023-09-01 NOTE — Progress Notes (Signed)
 Subjective:  Patient ID: Darryl Jones, male    DOB: November 13, 1955,  MRN: [id number]  Darryl Jones presents to clinic today for at risk foot care with history of diabetic neuropathy and callus(es) left foot, porokeratotic lesion(s) left foot, and painful mycotic nails. Painful toenails interfere with ambulation. Aggravating factors include wearing enclosed shoe gear. Pain is relieved with periodic professional debridement. Painful callus(es) and porokeratotic lesion(s) are aggravated when weightbearing with and without shoegear. Pain is relieved with periodic professional debridement.  Chief Complaint  Patient presents with   Nail Problem    Pt is here for [facility] last A1C was PCP is Dr Ransom and LOV was in February.   New problem(s): None.   PCP is Ransom Other, MD.  Allergies  Allergen Reactions   Hydroxyquinolines Anaphylaxis   Loratadine Anaphylaxis and Other (See Comments)   Other Anaphylaxis and Other (See Comments)   Pantoprazole Sodium Nausea Only and Anaphylaxis    nausea   Adalimumab Other (See Comments)    Caused an abscess in lung   Hydroxychloroquine  Other (See Comments)    Elevated LFTs   Leflunomide  Other (See Comments)    Elevated LFT's   Methotrexate Other (See Comments)   Methotrexate Derivatives Other (See Comments)    Elevated lft's   Prednisone  Other (See Comments)    Increase eye pressure   Aspirin  Rash and Other (See Comments)    Review of Systems: Negative except as noted in the HPI.  Objective: No changes noted in today's physical examination. There were no vitals filed for this visit. Darryl Jones is a pleasant 68 y.o. male in NAD. AAO x 3.   Vascular Examination: CFT <3 seconds b/l. DP/PT pulses faintly palpable b/l. Skin temperature gradient warm to warm b/l. No pain with calf compression. No ischemia or gangrene. No cyanosis or clubbing noted b/l. Pedal hair absent. No edema noted b/l LE.   Neurological Examination: Sensation grossly  intact b/l with 10 gram monofilament. Vibratory sensation intact b/l. Pt has subjective symptoms of neuropathy.  Dermatological Examination: Pedal skin warm and supple b/l.   No open wounds. No interdigital macerations.  Toenails 1-5 b/l thick, discolored, elongated with subungual debris and pain on dorsal palpation.    Hyperkeratotic lesion(s) IPJ of L hallux.  No erythema, no edema, no drainage, no fluctuance. Porokeratotic lesion(s) medial PIPJ of left fourth digit and submet head 4 left foot. No erythema, no edema, no drainage, no fluctuance.  Musculoskeletal Examination: Muscle strength 5/5 to all lower extremity muscle groups bilaterally. Pes planus deformity noted bilateral LE.SABRA No pain, crepitus or joint limitation noted with ROM b/l LE.  Patient ambulates independently without assistive aids. Plantarflexed metatarsal(s) 4th metatarsal head left foot.  Radiographs: None  Assessment/Plan: 1. Pain due to onychomycosis of toenail   2. Porokeratosis   3. Callus   4. Diabetic polyneuropathy associated with type 2 diabetes mellitus (HCC)   -Patient was evaluated today. All questions/concerns addressed on today's visit. -Mycotic toenails 1-5 bilaterally were debrided in length and girth with sterile nail nippers and dremel without incident. -Callus(es) IPJ of left great toe pared utilizing sharp debridement with sterile blade without complication or incident. Total number debrided =1. -Porokeratotic lesion(s) medial PIPJ of left fourth digit and submet head 4 left foot pared and enucleated with sterile currette without incident. Total number of lesions debrided=2. -Patient/POA to call should there be question/concern in the interim.   No follow-ups on file.  Delon LITTIE Merlin, DPM  [place] LOCATION: [address] [place] [address]                   Office [phone]   [facility] LOCATION: [address] [place], [place] [address] Office [phone]

## 2023-09-09 DIAGNOSIS — Z79899 Other long term (current) drug therapy: Secondary | ICD-10-CM | POA: Diagnosis not present

## 2023-09-09 DIAGNOSIS — J449 Chronic obstructive pulmonary disease, unspecified: Secondary | ICD-10-CM | POA: Diagnosis not present

## 2023-09-09 DIAGNOSIS — R918 Other nonspecific abnormal finding of lung field: Secondary | ICD-10-CM | POA: Diagnosis not present

## 2023-09-30 DIAGNOSIS — E785 Hyperlipidemia, unspecified: Secondary | ICD-10-CM | POA: Diagnosis not present

## 2023-09-30 DIAGNOSIS — E119 Type 2 diabetes mellitus without complications: Secondary | ICD-10-CM | POA: Diagnosis not present

## 2023-09-30 DIAGNOSIS — I480 Paroxysmal atrial fibrillation: Secondary | ICD-10-CM | POA: Diagnosis not present

## 2023-09-30 DIAGNOSIS — Z950 Presence of cardiac pacemaker: Secondary | ICD-10-CM | POA: Diagnosis not present

## 2023-09-30 DIAGNOSIS — I442 Atrioventricular block, complete: Secondary | ICD-10-CM | POA: Diagnosis not present

## 2023-09-30 DIAGNOSIS — J42 Unspecified chronic bronchitis: Secondary | ICD-10-CM | POA: Diagnosis not present

## 2023-10-04 DIAGNOSIS — E119 Type 2 diabetes mellitus without complications: Secondary | ICD-10-CM | POA: Diagnosis not present

## 2023-10-04 DIAGNOSIS — H2513 Age-related nuclear cataract, bilateral: Secondary | ICD-10-CM | POA: Diagnosis not present

## 2023-10-04 DIAGNOSIS — H401134 Primary open-angle glaucoma, bilateral, indeterminate stage: Secondary | ICD-10-CM | POA: Diagnosis not present

## 2023-10-15 DIAGNOSIS — I442 Atrioventricular block, complete: Secondary | ICD-10-CM | POA: Diagnosis not present

## 2023-11-01 DIAGNOSIS — L814 Other melanin hyperpigmentation: Secondary | ICD-10-CM | POA: Diagnosis not present

## 2023-11-01 DIAGNOSIS — D225 Melanocytic nevi of trunk: Secondary | ICD-10-CM | POA: Diagnosis not present

## 2023-11-01 DIAGNOSIS — L821 Other seborrheic keratosis: Secondary | ICD-10-CM | POA: Diagnosis not present

## 2023-11-01 DIAGNOSIS — Z85828 Personal history of other malignant neoplasm of skin: Secondary | ICD-10-CM | POA: Diagnosis not present

## 2023-11-01 DIAGNOSIS — L570 Actinic keratosis: Secondary | ICD-10-CM | POA: Diagnosis not present

## 2023-11-01 DIAGNOSIS — L820 Inflamed seborrheic keratosis: Secondary | ICD-10-CM | POA: Diagnosis not present

## 2023-11-06 DIAGNOSIS — Z0000 Encounter for general adult medical examination without abnormal findings: Secondary | ICD-10-CM | POA: Diagnosis not present

## 2023-11-06 DIAGNOSIS — H409 Unspecified glaucoma: Secondary | ICD-10-CM | POA: Diagnosis not present

## 2023-11-06 DIAGNOSIS — M069 Rheumatoid arthritis, unspecified: Secondary | ICD-10-CM | POA: Diagnosis not present

## 2023-11-06 DIAGNOSIS — Z7984 Long term (current) use of oral hypoglycemic drugs: Secondary | ICD-10-CM | POA: Diagnosis not present

## 2023-11-06 DIAGNOSIS — Z23 Encounter for immunization: Secondary | ICD-10-CM | POA: Diagnosis not present

## 2023-11-06 DIAGNOSIS — E782 Mixed hyperlipidemia: Secondary | ICD-10-CM | POA: Diagnosis not present

## 2023-11-06 DIAGNOSIS — N182 Chronic kidney disease, stage 2 (mild): Secondary | ICD-10-CM | POA: Diagnosis not present

## 2023-11-06 DIAGNOSIS — I700 Atherosclerosis of aorta: Secondary | ICD-10-CM | POA: Diagnosis not present

## 2023-11-06 DIAGNOSIS — I480 Paroxysmal atrial fibrillation: Secondary | ICD-10-CM | POA: Diagnosis not present

## 2023-11-06 DIAGNOSIS — J449 Chronic obstructive pulmonary disease, unspecified: Secondary | ICD-10-CM | POA: Diagnosis not present

## 2023-11-06 DIAGNOSIS — E1151 Type 2 diabetes mellitus with diabetic peripheral angiopathy without gangrene: Secondary | ICD-10-CM | POA: Diagnosis not present

## 2023-11-06 DIAGNOSIS — E1142 Type 2 diabetes mellitus with diabetic polyneuropathy: Secondary | ICD-10-CM | POA: Diagnosis not present

## 2023-11-06 DIAGNOSIS — D6869 Other thrombophilia: Secondary | ICD-10-CM | POA: Diagnosis not present

## 2023-11-06 DIAGNOSIS — I10 Essential (primary) hypertension: Secondary | ICD-10-CM | POA: Diagnosis not present

## 2023-11-29 ENCOUNTER — Encounter: Payer: Self-pay | Admitting: Podiatry

## 2023-11-29 ENCOUNTER — Ambulatory Visit: Admitting: Podiatry

## 2023-11-29 DIAGNOSIS — M216X2 Other acquired deformities of left foot: Secondary | ICD-10-CM

## 2023-11-29 DIAGNOSIS — M216X1 Other acquired deformities of right foot: Secondary | ICD-10-CM

## 2023-11-29 DIAGNOSIS — E119 Type 2 diabetes mellitus without complications: Secondary | ICD-10-CM

## 2023-11-29 DIAGNOSIS — B351 Tinea unguium: Secondary | ICD-10-CM | POA: Diagnosis not present

## 2023-11-29 DIAGNOSIS — E1142 Type 2 diabetes mellitus with diabetic polyneuropathy: Secondary | ICD-10-CM | POA: Diagnosis not present

## 2023-11-29 DIAGNOSIS — M79676 Pain in unspecified toe(s): Secondary | ICD-10-CM | POA: Diagnosis not present

## 2023-11-29 DIAGNOSIS — Z8631 Personal history of diabetic foot ulcer: Secondary | ICD-10-CM | POA: Diagnosis not present

## 2023-11-29 DIAGNOSIS — L84 Corns and callosities: Secondary | ICD-10-CM

## 2023-11-29 NOTE — Progress Notes (Signed)
 Subjective:  Patient ID: Darryl Jones, male    DOB: June 05, 1955,  MRN: [id number]  Darryl Jones presents to clinic today for at risk foot care. Patient has history of diabetic ulcer left 4th toe in 2015 and callus(es) left foot and painful thick toenails that are difficult to trim. Painful toenails interfere with ambulation. Aggravating factors include wearing enclosed shoe gear. Pain is relieved with periodic professional debridement. Painful calluses are aggravated when weightbearing with and without shoegear. Pain is relieved with periodic professional debridement. He  is also here for his annual diabetic foot examination. Chief Complaint  Patient presents with   [facility]    Rm1 Diabetic foot care/ Dr.Karrar Ransom last visit April 2025/A1c 7.6   New problem(s): None.   PCP is Ransom Other, MD.  Allergies  Allergen Reactions   Hydroxyquinolines Anaphylaxis   Loratadine Anaphylaxis and Other (See Comments)   Other Anaphylaxis and Other (See Comments)   Pantoprazole Sodium Nausea Only and Anaphylaxis    nausea   Adalimumab Other (See Comments)    Caused an abscess in lung   Hydroxychloroquine  Other (See Comments)    Elevated LFTs   Leflunomide  Other (See Comments)    Elevated LFT's   Methotrexate Other (See Comments)   Methotrexate Derivatives Other (See Comments)    Elevated lft's   Prednisone  Other (See Comments)    Increase eye pressure   Aspirin  Rash and Other (See Comments)    Review of Systems: Negative except as noted in the HPI.  Objective: No changes noted in today's physical examination. There were no vitals filed for this visit. Darryl Jones is a pleasant 68 y.o. male WD, WN in NAD. AAO x 3.   Diabetic foot exam was performed with the following findings:   Normal sensation of 10g monofilament Intact posterior tibialis and dorsalis pedis pulses Vascular Examination: Capillary refill time immediate b/l. Palpable pedal pulses. Pedal hair present b/l. Pedal  edema absent b/l. No pain with calf compression b/l. Skin temperature gradient WNL b/l. No cyanosis or clubbing b/l. No ischemia or gangrene noted b/l.   Neurological Examination: Sensation grossly intact b/l with 10 gram monofilament. Vibratory sensation intact b/l.   Dermatological Examination: Pt has subjective symptoms of neuropathy. Pedal skin with normal turgor, texture and tone b/l.  No open wounds. No interdigital macerations.   Toenails 1-5 b/l thick, discolored, elongated with subungual debris and pain on dorsal palpation.   Hyperkeratotic lesion(s) medial IPJ of left great toe, lateral PIPJ of left fourth digit, and submet head 4 left foot.  No erythema, no edema, no drainage, no fluctuance.  Musculoskeletal Examination: Muscle strength 5/5 to all lower extremity muscle groups bilaterally. Plantarflexed metatarsal(s) 4th metatarsal head left foot. Pes planus deformity noted bilateral LE. No pain, crepitus or joint limitation noted with ROM b/l LE.  Patient ambulates independently without assistive aids.  Radiographs: None    Assessment/Plan: 1. Pain due to onychomycosis of toenail   2. Callus   3. History of diabetic ulcer of foot   4. Plantarflexion deformity of both feet   5. Diabetic peripheral neuropathy associated with type 2 diabetes mellitus (HCC)   6. Encounter for diabetic foot exam (HCC)   Diabetic foot examination performed today.  All patient's and/or POA's questions/concerns addressed on today's visit. Toenails 1-5 debrided in length and girth. Light bleeding left great toe and left 5th digit addressed with Lumicain. TAO applied. No further treatment required. Corn(s) and Callus(es) medial IPJ of left great toe, lateral  PIPJ of left fourth digit, and submet head 4 left foot pared with sharp debridement without incident. Continue daily foot inspections and monitor blood glucose per PCP/Endocrinologist's recommendations. Continue soft, supportive shoe gear daily.  Report any pedal injuries to medical professional. Call office if there are any questions/concerns.  Return in about 3 months (around 02/28/2024).  Darryl Jones, DPM      [place] LOCATION: [address], [place] [address]                   Office [phone]   [facility] LOCATION: [address] [place], [place] [address] Office [phone]

## 2023-12-13 DIAGNOSIS — M1990 Unspecified osteoarthritis, unspecified site: Secondary | ICD-10-CM | POA: Diagnosis not present

## 2023-12-13 DIAGNOSIS — M0609 Rheumatoid arthritis without rheumatoid factor, multiple sites: Secondary | ICD-10-CM | POA: Diagnosis not present

## 2023-12-13 DIAGNOSIS — Z79899 Other long term (current) drug therapy: Secondary | ICD-10-CM | POA: Diagnosis not present

## 2023-12-13 DIAGNOSIS — M8580 Other specified disorders of bone density and structure, unspecified site: Secondary | ICD-10-CM | POA: Diagnosis not present

## 2023-12-27 DIAGNOSIS — Z79899 Other long term (current) drug therapy: Secondary | ICD-10-CM | POA: Diagnosis not present

## 2023-12-27 DIAGNOSIS — M0609 Rheumatoid arthritis without rheumatoid factor, multiple sites: Secondary | ICD-10-CM | POA: Diagnosis not present

## 2024-02-28 ENCOUNTER — Ambulatory Visit: Admitting: Podiatry

## 2024-02-28 DIAGNOSIS — L84 Corns and callosities: Secondary | ICD-10-CM | POA: Diagnosis not present

## 2024-02-28 DIAGNOSIS — M79676 Pain in unspecified toe(s): Secondary | ICD-10-CM

## 2024-02-28 DIAGNOSIS — E1142 Type 2 diabetes mellitus with diabetic polyneuropathy: Secondary | ICD-10-CM | POA: Diagnosis not present

## 2024-02-28 DIAGNOSIS — B351 Tinea unguium: Secondary | ICD-10-CM | POA: Diagnosis not present

## 2024-02-28 NOTE — Progress Notes (Unsigned)
"  °  Subjective:  Patient ID: Darryl Jones, male    DOB: Jun 02, 1955,  MRN: [id number]  Darryl Jones presents to clinic today for {jgcomplaint:23593} No chief complaint on file.  New problem(s): None. {jgcomplaint:23593}  PCP is Ransom Other, MD.  Allergies[1]  Review of Systems: Negative except as noted in the HPI.  Objective: No changes noted in today's physical examination. There were no vitals filed for this visit. Darryl Jones is a pleasant 68 y.o. male {jgbodyhabitus:24098} AAO x 3.  Normal sensation of 10g monofilament Intact posterior tibialis and dorsalis pedis pulses Vascular Examination: Capillary refill time immediate b/l. Palpable pedal pulses. Pedal hair present b/l. Pedal edema absent b/l. No pain with calf compression b/l. Skin temperature gradient WNL b/l. No cyanosis or clubbing b/l. No ischemia or gangrene noted b/l.   Neurological Examination: Sensation grossly intact b/l with 10 gram monofilament. Vibratory sensation intact b/l.   Dermatological Examination: Pt has subjective symptoms of neuropathy. Pedal skin with normal turgor, texture and tone b/l.  No open wounds. No interdigital macerations.   Toenails 1-5 b/l thick, discolored, elongated with subungual debris and pain on dorsal palpation.   Hyperkeratotic lesion(s) medial IPJ of left great toe, lateral PIPJ of left fourth digit, and submet head 4 left Jones.  No erythema, no edema, no drainage, no fluctuance.  Musculoskeletal Examination: Muscle strength 5/5 to all lower extremity muscle groups bilaterally. Plantarflexed metatarsal(s) 4th metatarsal head left Jones. Pes planus deformity noted bilateral LE. No pain, crepitus or joint limitation noted with ROM b/l LE.  Patient ambulates independently without assistive aids.  Radiographs: None  Assessment/Plan: No diagnosis found.  No orders of the defined types were placed in this encounter.   None {Jgplan:23602::-Patient/POA to call should  there be question/concern in the interim.}   Return in about 3 months (around 05/28/2024).  Darryl Jones, DPM      [place] LOCATION: [address] [place], [place] [address]                   Office [phone]   [place] LOCATION: [address] [place], [place] [address] Office [phone]     [1]  Allergies Allergen Reactions   Hydroxyquinolines Anaphylaxis   Loratadine Anaphylaxis and Other (See Comments)   Other Anaphylaxis and Other (See Comments)   Pantoprazole Sodium Nausea Only and Anaphylaxis    nausea   Adalimumab Other (See Comments)    Caused an abscess in lung   Hydroxychloroquine  Other (See Comments)    Elevated LFTs   Leflunomide  Other (See Comments)    Elevated LFT's   Methotrexate Other (See Comments)   Methotrexate And Trimetrexate Other (See Comments)    Elevated lft's   Prednisone  Other (See Comments)    Increase eye pressure   Aspirin  Rash and Other (See Comments)   "

## 2024-03-03 ENCOUNTER — Encounter: Payer: Self-pay | Admitting: Podiatry

## 2024-03-23 ENCOUNTER — Other Ambulatory Visit
Admission: RE | Admit: 2024-03-23 | Discharge: 2024-03-23 | Disposition: A | Discharge status: Home / Self Care | Source: Ambulatory Visit | Attending: Pulmonary Disease | Admitting: Pulmonary Disease

## 2024-03-23 DIAGNOSIS — J208 Acute bronchitis due to other specified organisms: Secondary | ICD-10-CM | POA: Diagnosis present

## 2024-03-23 DIAGNOSIS — J441 Chronic obstructive pulmonary disease with (acute) exacerbation: Secondary | ICD-10-CM | POA: Diagnosis present

## 2024-03-23 LAB — D-DIMER, QUANTITATIVE: D-Dimer, Quant: 1.24 ug{FEU}/mL — ABNORMAL HIGH (ref 0.00–0.50)

## 2024-03-24 ENCOUNTER — Ambulatory Visit
Admission: RE | Admit: 2024-03-24 | Discharge: 2024-03-24 | Disposition: A | Discharge status: Home / Self Care | Source: Ambulatory Visit | Attending: Pulmonary Disease | Admitting: Pulmonary Disease

## 2024-03-24 ENCOUNTER — Other Ambulatory Visit: Payer: Self-pay | Admitting: Pulmonary Disease

## 2024-03-24 DIAGNOSIS — J208 Acute bronchitis due to other specified organisms: Secondary | ICD-10-CM | POA: Insufficient documentation

## 2024-03-24 DIAGNOSIS — R7989 Other specified abnormal findings of blood chemistry: Secondary | ICD-10-CM | POA: Diagnosis present

## 2024-03-24 DIAGNOSIS — R0602 Shortness of breath: Secondary | ICD-10-CM

## 2024-03-24 LAB — POCT I-STAT CREATININE: Creatinine, Ser: 1.1 mg/dL (ref 0.61–1.24)

## 2024-03-24 MED ORDER — IOHEXOL 350 MG/ML SOLN
75.0000 mL | Freq: Once | INTRAVENOUS | Status: AC | PRN
  Administered 2024-03-24: 75 mL via INTRAVENOUS

## 2024-04-03 ENCOUNTER — Ambulatory Visit (INDEPENDENT_AMBULATORY_CARE_PROVIDER_SITE_OTHER): Payer: Medicare Other | Admitting: Nurse Practitioner

## 2024-04-03 ENCOUNTER — Encounter (INDEPENDENT_AMBULATORY_CARE_PROVIDER_SITE_OTHER): Payer: Medicare Other

## 2024-05-28 ENCOUNTER — Ambulatory Visit: Admitting: Podiatry
